# Patient Record
Sex: Male | Born: 1940 | Race: Asian | Hispanic: No | Marital: Married | Smoking: Former smoker
Health system: Southern US, Community
[De-identification: ages and names within clinical notes are randomized; demographics above are authoritative.]

## PROBLEM LIST (undated history)

## (undated) DIAGNOSIS — Z950 Presence of cardiac pacemaker: Secondary | ICD-10-CM

## (undated) DIAGNOSIS — I509 Heart failure, unspecified: Secondary | ICD-10-CM

## (undated) DIAGNOSIS — T8859XA Other complications of anesthesia, initial encounter: Secondary | ICD-10-CM

## (undated) DIAGNOSIS — I10 Essential (primary) hypertension: Secondary | ICD-10-CM

## (undated) DIAGNOSIS — H269 Unspecified cataract: Secondary | ICD-10-CM

## (undated) DIAGNOSIS — N529 Male erectile dysfunction, unspecified: Secondary | ICD-10-CM

## (undated) DIAGNOSIS — I442 Atrioventricular block, complete: Secondary | ICD-10-CM

## (undated) DIAGNOSIS — Z860100 Personal history of colon polyps, unspecified: Secondary | ICD-10-CM

## (undated) HISTORY — PX: VASECTOMY: SHX75

## (undated) HISTORY — DX: Personal history of colon polyps, unspecified: Z86.0100

## (undated) HISTORY — DX: Atrioventricular block, complete: I44.2

## (undated) HISTORY — DX: Essential (primary) hypertension: I10

## (undated) HISTORY — PX: INSERT / REPLACE / REMOVE PACEMAKER: SUR710

## (undated) HISTORY — DX: Personal history of colonic polyps: Z86.010

## (undated) HISTORY — DX: Male erectile dysfunction, unspecified: N52.9

## (undated) HISTORY — DX: Unspecified cataract: H26.9

## (undated) HISTORY — PX: KNEE SURGERY: SHX244

---

## 1996-12-22 HISTORY — PX: PACEMAKER PLACEMENT: SHX43

## 2004-05-01 ENCOUNTER — Other Ambulatory Visit: Payer: Self-pay

## 2004-05-02 ENCOUNTER — Ambulatory Visit: Payer: Self-pay | Admitting: Internal Medicine

## 2008-01-06 ENCOUNTER — Encounter: Admission: RE | Admit: 2008-01-06 | Discharge: 2008-02-12 | Payer: Self-pay | Admitting: Sports Medicine

## 2008-02-17 ENCOUNTER — Ambulatory Visit: Payer: Self-pay | Admitting: Cardiology

## 2008-02-25 ENCOUNTER — Ambulatory Visit: Payer: Self-pay | Admitting: Family Medicine

## 2008-02-25 DIAGNOSIS — I10 Essential (primary) hypertension: Secondary | ICD-10-CM | POA: Insufficient documentation

## 2008-02-25 DIAGNOSIS — Z950 Presence of cardiac pacemaker: Secondary | ICD-10-CM

## 2008-03-07 ENCOUNTER — Ambulatory Visit: Payer: Self-pay | Admitting: Internal Medicine

## 2008-04-26 ENCOUNTER — Ambulatory Visit: Payer: Self-pay | Admitting: Family Medicine

## 2008-07-20 DIAGNOSIS — I442 Atrioventricular block, complete: Secondary | ICD-10-CM | POA: Insufficient documentation

## 2008-08-29 ENCOUNTER — Encounter: Payer: Self-pay | Admitting: Internal Medicine

## 2008-09-07 ENCOUNTER — Encounter: Payer: Self-pay | Admitting: Internal Medicine

## 2008-09-07 ENCOUNTER — Ambulatory Visit: Payer: Self-pay | Admitting: Internal Medicine

## 2009-03-01 ENCOUNTER — Encounter (INDEPENDENT_AMBULATORY_CARE_PROVIDER_SITE_OTHER): Payer: Self-pay | Admitting: *Deleted

## 2009-04-06 ENCOUNTER — Ambulatory Visit: Payer: Self-pay | Admitting: Internal Medicine

## 2009-04-07 LAB — CONVERTED CEMR LAB
BUN: 15 mg/dL (ref 6–23)
Calcium: 8.7 mg/dL (ref 8.4–10.5)
Creatinine, Ser: 0.9 mg/dL (ref 0.4–1.5)
GFR calc non Af Amer: 88.86 mL/min (ref >60)
Glucose, Bld: 132 mg/dL — ABNORMAL HIGH (ref 70–99)

## 2009-05-19 ENCOUNTER — Ambulatory Visit: Payer: Self-pay | Admitting: Family Medicine

## 2009-05-19 DIAGNOSIS — N529 Male erectile dysfunction, unspecified: Secondary | ICD-10-CM | POA: Insufficient documentation

## 2009-05-22 LAB — CONVERTED CEMR LAB
ALT: 13 units/L (ref 0–53)
AST: 18 units/L (ref 0–37)
Calcium: 9.5 mg/dL (ref 8.4–10.5)
Chloride: 101 meq/L (ref 96–112)
Creatinine, Ser: 0.89 mg/dL (ref 0.40–1.50)
HCT: 49.3 % (ref 39.0–52.0)
HDL goal, serum: 40 mg/dL
PSA: 0.53 ng/mL (ref 0.10–4.00)
Platelets: 185 10*3/uL (ref 150–400)
Potassium: 4 meq/L (ref 3.5–5.3)
RDW: 12.9 % (ref 11.5–15.5)
Sodium: 139 meq/L (ref 135–145)
TSH: 1.08 microintl units/mL (ref 0.350–4.500)
Total CHOL/HDL Ratio: 3
Total Protein: 7.7 g/dL (ref 6.0–8.3)

## 2009-08-07 ENCOUNTER — Telehealth (INDEPENDENT_AMBULATORY_CARE_PROVIDER_SITE_OTHER): Payer: Self-pay | Admitting: *Deleted

## 2009-08-07 ENCOUNTER — Encounter: Payer: Self-pay | Admitting: Family Medicine

## 2009-09-11 ENCOUNTER — Ambulatory Visit: Payer: Self-pay

## 2009-09-11 ENCOUNTER — Encounter: Payer: Self-pay | Admitting: Internal Medicine

## 2010-03-16 ENCOUNTER — Ambulatory Visit: Payer: Self-pay | Admitting: Internal Medicine

## 2010-04-03 ENCOUNTER — Telehealth (INDEPENDENT_AMBULATORY_CARE_PROVIDER_SITE_OTHER): Payer: Self-pay | Admitting: *Deleted

## 2010-05-21 ENCOUNTER — Ambulatory Visit: Payer: Self-pay | Admitting: Family Medicine

## 2010-05-28 ENCOUNTER — Encounter: Payer: Self-pay | Admitting: Family Medicine

## 2010-05-29 LAB — CONVERTED CEMR LAB
ALT: 20 units/L (ref 0–53)
CO2: 23 meq/L (ref 19–32)
Cholesterol: 196 mg/dL (ref 0–200)
Creatinine, Ser: 0.91 mg/dL (ref 0.40–1.50)
Glucose, Bld: 102 mg/dL — ABNORMAL HIGH (ref 70–99)
Total Bilirubin: 0.8 mg/dL (ref 0.3–1.2)
Total CHOL/HDL Ratio: 3.4
Triglycerides: 94 mg/dL (ref <150)
VLDL: 19 mg/dL (ref 0–40)

## 2010-07-10 NOTE — Cardiovascular Report (Signed)
Summary: Office Visit   Office Visit   Imported By: Roderic Ovens 03/22/2010 15:43:58  _____________________________________________________________________  External Attachment:    Type:   Image     Comment:   External Document

## 2010-07-10 NOTE — Progress Notes (Signed)
----   Converted from flag ---- ---- 08/07/2009 1:30 PM, Nani Gasser MD wrote: Call pt:if stopped his BP med because of SE then needs to f/u for OV so we can discuss options. ------------------------------  Spoke with pt- he did and will schedule OV

## 2010-07-10 NOTE — Procedures (Signed)
Summary: device/saf   Current Medications (verified): 1)  Aspirin 81 Mg Tbec (Aspirin) .... 2 or 3 Times A Week 2)  Vitamin C Cr 500 Mg Cr-Caps (Ascorbic Acid) .... Take One Tablet By Mouth Once A Day 3)  Multivitamins  Tabs (Multiple Vitamin) .... Take One Tablet By Mouth One Ad Ay 4)  Cialis 10 Mg Tabs (Tadalafil) .... Take 1 Tablet By Mouth Once A Day As Needed  Allergies (verified): No Known Drug Allergies   PPM Specifications Following MD:  Hillis Range, MD     PPM Vendor:  St Jude     PPM Model Number:  [id number]     PPM Serial Number:  [id number] DOI:  05/02/2004     PPM Implanting MD:  NOT IMPLANTED HERE  Lead 1    Location: RA     DOI: 12/22/1996     Model #: [id number]     Serial #: [id number]     Status: active Lead 2    Location: RV     DOI: 12/22/1996     Model #: 1346T     Serial #: [id number]     Status: active  Magnet Response Rate:  BOL 98.6 ERI  86.3  Indications:  CHB   PPM Follow Up Remote Check?  No Battery Voltage:  2.74 V     Battery Est. Longevity:  4 YEARS     Pacer Dependent:  Yes       PPM Device Measurements Atrium  Amplitude: 4.0 mV, Impedance: 408 ohms, Threshold: 0.5 V at 0.4 msec Right Ventricle  Amplitude: PACED AT 30 mV, Impedance: 539 ohms, Threshold: 1.25 V at 0.6 msec  Episodes MS Episodes:  2     Percent Mode Switch:  <1%     Coumadin:  No Ventricular High Rate:  NOT AVAILABLE      Atrial Pacing:  17%     Ventricular Pacing:  >99%  Parameters Mode:  DDD     Lower Rate Limit:  60     Upper Rate Limit:  125 Paced AV Delay:  200     Sensed AV Delay:  200 Next Cardiology Appt Due:  03/10/2010 Tech Comments:  Normal device function.  No changes made today.  Both mode switch episodes less than 1 minute.  No EGMs because of battery drain.  Pt stopped HCTZ on his own because he states it was not helping his HTN and was causing side effects.  PCP is following BP. ROV 6 months Dr Johney Frame. Gypsy Balsam RN BSN  September 11, 2009 2:02 PM

## 2010-07-10 NOTE — Cardiovascular Report (Signed)
Summary: Office Visit   Office Visit   Imported By: Roderic Ovens 09/28/2009 13:58:38  _____________________________________________________________________  External Attachment:    Type:   Image     Comment:   External Document

## 2010-07-10 NOTE — Progress Notes (Signed)
----   Converted from flag ---- ---- 04/02/2010 1:05 PM, Nani Gasser MD wrote: Call pt: I don't have a screening colonsocy on file for him. We can scheduel if he is oK with that or if not then can do stool cards to screen for colon Ca. Can pick up any time. ------------------------------  04/03/10 acm. 8:33 called and spoke with pt's wife and told her above reccomendations.Pt's wife will have him call us back to let us know what he wants to do

## 2010-07-10 NOTE — Letter (Signed)
Summary: [facility]   Imported By: Lanelle Bal 08/09/2009 10:14:45  _____________________________________________________________________  External Attachment:    Type:   Image     Comment:   External Document

## 2010-07-10 NOTE — Assessment & Plan Note (Signed)
Summary: pc2   Primary Provider:  Nani Gasser MD   History of Present Illness: The patient presents today for routine electrophysiology followup. He reports doing very well since last being seen in our clinic. The patient denies symptoms of palpitations, chest pain, shortness of breath, orthopnea, PND, lower extremity edema, dizziness, presyncope, syncope, or neurologic sequela. The patient is tolerating medications without difficulties and is otherwise without complaint today.   Current Medications (verified): 1)  Vitamin C Cr 500 Mg Cr-Caps (Ascorbic Acid) .... Take One Tablet By Mouth Once A Day 2)  Multivitamins  Tabs (Multiple Vitamin) .... Take One Tablet By Mouth One Ad Ay  Allergies (verified): No Known Drug Allergies  Past History:  Past Medical History:  1. Complete heart block    2. Status post dual-chamber pacemaker implanted  (current device is SJM)  3. Spontaneous pneumothorax years ago.   4. HTN (per pt well controlled at home)  Past Surgical History: Reviewed history from 07/20/2008 and no changes required. Knee surgery, right  1955 pacermaker  7.1998, pacer replaced 04/2004 (SJM) Colonsocopy with polyps 2000, 2003 Vasectomy   Vital Signs:  Patient profile:   70 year old male Height:      71 inches Weight:      147 pounds BMI:     20.58 Pulse rate:   72 / minute BP sitting:   150 / 70  (left arm)  Vitals Entered By: Laurance Flatten CMA (March 16, 2010 12:25 PM)  Physical Exam  General:  Well developed, well nourished, in no acute distress. Head:  normocephalic and atraumatic Eyes:  PERRLA/EOM intact; conjunctiva and lids normal. Mouth:  Teeth, gums and palate normal. Oral mucosa normal. Neck:  Neck supple, no JVD. No masses, thyromegaly or abnormal cervical nodes. Chest Wall:  pacemaker site well healed Lungs:  Clear bilaterally to auscultation and percussion. Heart:  RRR, no m/r/g Abdomen:  Bowel sounds positive; abdomen soft and non-tender  without masses, organomegaly, or hernias noted. No hepatosplenomegaly. Msk:  Back normal, normal gait. Muscle strength and tone normal. Pulses:  pulses normal in all 4 extremities Extremities:  No clubbing or cyanosis. Neurologic:  Alert and oriented x 3.   PPM Specifications Following MD:  Hillis Range, MD     PPM Vendor:  St Jude     PPM Model Number:  [phone]     PPM Serial Number:  [id number] DOI:  05/02/2004     PPM Implanting MD:  NOT IMPLANTED HERE  Lead 1    Location: RA     DOI: 12/22/1996     Model #: 1342T     Serial #: [id number]     Status: active Lead 2    Location: RV     DOI: 12/22/1996     Model #: 1346T     Serial #: [id number]     Status: active  Magnet Response Rate:  BOL 98.6 ERI  86.3  Indications:  CHB   PPM Follow Up Remote Check?  No Battery Voltage:  2.74 V     Battery Est. Longevity:  2.25 years     Pacer Dependent:  Yes       PPM Device Measurements Atrium  Amplitude: 3.1 mV, Impedance: 469 ohms, Threshold: 0.5 V at 0.6 msec Right Ventricle  Impedance: 539 ohms, Threshold: 1.0 V at 0.6 msec  Episodes MS Episodes:  1     Percent Mode Switch:  <1%     Coumadin:  No Atrial Pacing:  28%     Ventricular Pacing:  100%  Parameters Mode:  DDD     Lower Rate Limit:  60     Upper Rate Limit:  125 Paced AV Delay:  200     Sensed AV Delay:  200 Next Cardiology Appt Due:  09/09/2010 Tech Comments:  No parameter changes.  Device function normal.  ROV 6 months clinic. Altha Harm, LPN  March 16, 2010 12:32 PM  MD Comments:  agree  Impression & Recommendations:  Problem # 1:  AV BLOCK, COMPLETE (ICD-426.0) normal pacemaker function no changes  Problem # 2:  ELEVATED BLOOD PRESSURE WITHOUT DIAGNOSIS OF HYPERTENSION (ICD-796.2) though BP is elevated today, he brings BP readings from home which are 120-130/70s.  He declines antihypertensive medicine.  HE takes ASA only intermittently.  Salt restrction advised  Patient Instructions: 1)  return in 6 months

## 2010-07-12 NOTE — Assessment & Plan Note (Signed)
Summary: CPE   Vital Signs:  Patient profile:   69 year old male Height:      71 inches Weight:      147 pounds BMI:     20.58 Pulse rate:   75 / minute BP sitting:   155 / 83  (right arm) Cuff size:   regular  Vitals Entered By: Avon Gully CMA, Duncan Dull) (May 21, 2010 1:06 PM)  Primary Care Donald Patel:  Donald Gasser MD   History of Present Illness: HOme BPs look great. Saw Dr. Tillie Rung (Cards) about a month ago. He is doing well.  No specific complaints today. He is not fasting.   Current Medications (verified): 1)  Vitamin C Cr 500 Mg Cr-Caps (Ascorbic Acid) .... Take One Tablet By Mouth Once A Day 2)  Multivitamins  Tabs (Multiple Vitamin) .... Take One Tablet By Mouth One Ad Ay  Allergies (verified): No Known Drug Allergies  Comments:  Nurse/Medical Assistant: The patient's medications and allergies were reviewed with the patient and were updated in the Medication and Allergy Lists. Avon Gully CMA, Duncan Dull) (May 21, 2010 1:07 PM)  Past History:  Past Medical History: Last updated: 03/16/2010  1. Complete heart block    2. Status post dual-chamber pacemaker implanted  (current device is SJM)  3. Spontaneous pneumothorax years ago.   4. 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  HS education. Married to [employer] with 2 adult kids.  Former Smoker Alcohol use-yes Drug use-no Regular exercise-no  Review of Systems  The patient denies anorexia, fever, weight loss, weight gain, vision loss, decreased hearing, hoarseness, chest pain, syncope, dyspnea on exertion, peripheral  edema, prolonged cough, headaches, hemoptysis, abdominal pain, melena, hematochezia, severe indigestion/heartburn, hematuria, incontinence, genital sores, muscle weakness, suspicious skin lesions, transient blindness, difficulty walking, depression, unusual weight change, abnormal bleeding, enlarged lymph nodes, and breast masses.    Physical Exam  General:  Well-developed,well-nourished,in no acute distress; alert,appropriate and cooperative throughout examination Head:  Normocephalic and atraumatic without obvious abnormalities. No apparent alopecia or balding. Eyes:  No corneal or conjunctival inflammation noted. EOMI. Perrla. Ears:  External ear exam shows no significant lesions or deformities.  Otoscopic examination reveals clear canals, tympanic membranes are intact bilaterally without bulging, retraction, inflammation or discharge. Hearing is grossly normal bilaterally. Nose:  External nasal examination shows no deformity or inflammation.  Mouth:  Oral mucosa and oropharynx without lesions or exudates.  Neck:  No deformities, masses, or tenderness noted. Chest Wall:  No deformities, masses, tenderness or gynecomastia noted. Lungs:  Normal respiratory effort, chest expands symmetrically. Lungs are clear to auscultation, no crackles or wheezes. Heart:  Normal rate and regular rhythm. S1 and S2 normal without gallop, murmur, click, rub or other extra sounds.No carotid or abdominal bruits.  Abdomen:  Bowel sounds positive,abdomen soft and non-tender without masses, organomegaly or hernias noted. Rectal:  normal sphincter tone, no masses, and external hemorrhoid(s).   Prostate:  no nodules, no asymmetry, and 2+ enlarged.   Msk:  No deformity or scoliosis noted of thoracic or lumbar spine.   Pulses:  R and L carotid,radial,dorsalis pedis and posterior tibial pulses are full and equal  bilaterally Extremities:  No clubbing, cyanosis, edema, or deformity noted with normal full range of motion of all  joints.   Neurologic:  No cranial nerve deficits noted. Station and gait are normal.DTRs are symmetrical throughout. Sensory, motor and coordinative functions appear intact. Skin:  no rashes.   Cervical Nodes:  No lymphadenopathy noted Psych:  Cognition and judgment appear intact. Alert and cooperative with normal attention span and concentration. No apparent delusions, illusions, hallucinations   Impression & Recommendations:  Problem # 1:  HEALTH MAINTENANCE EXAM (ICD-V70.0) Declined flu vaccine. Tdap and PNA are uptodate Needs labs. LDL was mildly elevated last year.  Encourage regular exercise. He still works. Dsicussed getting the zostavax and gave him a rx today.  Orders: T-Comprehensive Metabolic Panel [id number]) T-PSA Total (Medicare Screen Only) [id number]) T-Lipid Profile [phone])  Complete Medication List: 1)  Vitamin C Cr 500 Mg Cr-caps (Ascorbic acid) .... Take one tablet by mouth once a day 2)  Multivitamins Tabs (Multiple vitamin) .... Take one tablet by mouth one ad ay 3)  Aspirin 81 Mg Tabs (Aspirin) .... Take 1 tablet by mouth once a day 4)  Zostavax 22025 Unt/0.26ml Solr (Zoster vaccine live) .... Inject im x one.  Contraindications/Deferment of Procedures/Staging:    Test/Procedure: FLU VAX    Reason for deferment: patient declined   Patient Instructions: 1)  Our lab is open 8AM monday through Friday.  2)  We will call you back as soon as we get your lab results.  Prescriptions: ZOSTAVAX 42706 UNT/0.65ML SOLR (ZOSTER VACCINE LIVE) inject IM x one.  #1 x 0   Entered and Authorized by:   Donald Gasser MD   Signed by:   Donald Gasser MD on 05/21/2010   Method used:   Print then Give to Patient   RxID:   [id number]    Orders Added: 1)  T-Comprehensive Metabolic Panel [[id number]] 2)  T-PSA Total (Medicare Screen Only) [[id number]] 3)  T-Lipid Profile [[id number]] 4)  Est. Patient age 66&> [93818]     Last Flu Vaccine:   Historical (08/12/1997 1:00:37 PM) Flu Vaccine Next Due:  Refused

## 2010-09-10 ENCOUNTER — Ambulatory Visit (INDEPENDENT_AMBULATORY_CARE_PROVIDER_SITE_OTHER): Payer: Medicare Other | Admitting: *Deleted

## 2010-09-10 DIAGNOSIS — I442 Atrioventricular block, complete: Secondary | ICD-10-CM

## 2010-10-23 NOTE — Assessment & Plan Note (Signed)
[facility]                            CARDIOLOGY OFFICE NOTE   NAME:Donald Patel, Donald Patel                       MRN:          [id number]  DATE:02/17/2008                            DOB:          1940-10-24    [name] is a 70 year old male who has previously been followed in  [facility] who presents for evaluation of occasional chest pain and for  prior pacemaker placement.  He has previously been followed by Dr.  Dario Guardian in [place] as well as Dr. Cleatis Polka.  He apparently had a  pacemaker placed in July 1998.  It is a Biomedical scientist.  It was  replaced in November 2005.  His last battery check was on January 28, 2008.  The device apparently was placed because he was having episodes  of syncope at work and Holter monitor revealed bradycardia.  I do not  have those records available.  Also of note, the patient had a Myoview  performed on November 09, 2007, by Dr. Dario Guardian.  Apparently, he was normal,  but those records are not available.  The patient does state that he  occasionally feels a pain in his chest.  However, it is typically when  he is working with his arms.  It only lasts seconds and it does not  radiate.  There is no associated symptoms.  Note, he otherwise does not  have exertional chest pain.  He does not have dyspnea on exertion,  orthopnea, PND, pedal edema, palpitations, pre-syncope, or syncope.  He  recently moved to [facility] and wanted to be followed here in  [place].   His medications include aspirin 81 mg p.o. daily, vitamin C 500 mg p.o.  daily, multivitamin daily.   He has no known drug allergies.   SOCIAL HISTORY:  He has remote history of tobacco use, but has not  smoked since late 1980s.  He consumes 2 beers per day.   FAMILY HISTORY:  Positive for coronary artery disease and his brother  died of a myocardial infarction in his 71s.   PAST MEDICAL HISTORY:  He denies any diabetes, hypertension, or  hyperlipidemia.  He has  had a previous pneumothorax requiring chest tube  placement.  He also has a previous pacemaker as described above.  He has  had previous polyps removed as well.  He had knee surgery as a child.   REVIEW OF SYSTEMS:  He denies any headaches, fevers, or chills.  There  is no productive cough or hemoptysis.  There is no dysphagia or  odynophagia, melena or hematochezia.  There is no dysuria or hematuria.  There is no rashes or seizure activity.  There is no orthopnea, PND, or  pedal edema.  There is no claudication noted.  Remaining systems are  negative.   PHYSICAL EXAMINATION:  VITAL SIGNS:  Today, shows a blood pressure of  148/80.  His pulse is 60.  He weighs 160 pounds.  GENERAL:  He is well developed and well nourished in no acute distress.  SKIN:  Warm and dry.  Not depressed.  No peripheral clubbing.  BACK:  Normal.  HEENT:  Normal.  Normal eyelids.  NECK:  Supple with normal upstroke bilaterally and no bruits noted.  There is no jugular venous distention.  I cannot appreciate thyromegaly.  CHEST:  Clear to auscultation without rhonchi or rales.  CARDIOVASCULAR:  Regular rate and rhythm.  Normal S1 and S2.  There is  no murmurs noted, but his heart sounds are distant.  ABDOMEN:  Nontender with positive bowel sounds.  No hepatosplenomegaly.  No mass appreciated.  There is no abdominal bruit.  He has 2+ femoral  pulses bilaterally.  No bruits.  EXTREMITIES:  No edema palpate no cords.  He has 2+ dorsalis pedis  pulses bilaterally.  NEUROLOGIC:  Grossly intact.   His electrocardiogram shows AV sequential pacing.   DIAGNOSES:  1. Status post pacemaker placement - his device apparently was checked      recently and was functioning appropriately.  We will arrange for      him to be followed in one of the Pacemaker Clinic with one of our      electrophysiologist.  2. History of atypical chest pain - he had a recent Myoview requested      by Dr. Dario Guardian.  We will have those records  forwarded to [place].  3. Borderline hypertension - his blood pressure is mildly elevated.      He states at home and is within normal range.  This will be      followed and additional medications can be added as indicated.   He will see [place] back on an as-needed basis if his previous Myoview is  normal.  In the meantime, we will arrange for him to be seen by one of  the electrophysiologist as described above.  We will also arrange for  him to have a primary care physician here in [place] (either Dr.  Cathey Endow or Dr. Linford Arnold).     Madolyn Frieze Jens Som, MD, [facility]  Electronically Signed    BSC/MedQ  DD: 02/17/2008  DT: 02/18/2008  Job #: [id number]

## 2010-10-23 NOTE — Assessment & Plan Note (Signed)
[facility]                         ELECTROPHYSIOLOGY OFFICE NOTE   NAME:Donald Patel, Donald Patel                     MRN:          [id number]  DATE:03/07/2008                            DOB:          Oct 15, 1940    REFERRING PHYSICIAN:  Madolyn Frieze. Jens Som, MD, [facility]   CHIEF COMPLAINT:  I am here to establish care.   HISTORY OF PRESENT ILLNESS:  Donald Patel is a very pleasant 70 year old  gentleman with a history of complete heart block, status post pacemaker  implantation who presents today to establish care in the Pacemaker  Clinic.  The patient reports having syncope in July 1998 for which he  presented and was found to have complete heart block.  He underwent dual-  chamber pacemaker implantation by Dr. Dario Guardian in [place] at that time.  The patient reports doing well without further syncopal episodes.  He  subsequently underwent pulse generator replacement with a St. Jude  Medical Identity ADx XL DR dual-chamber pacemaker implanted May 02, 2004.  The existing right atrial lead is a Designer, jewellery model [phone] (serial# [id number]) lead and the ventricular lead is a Transport planner model [id number] (serial number [id number]) lead.  This leads  replaced December 22, 1996.  The patient reports doing very well since that  time.  He denies any further symptoms of presyncope.  He has also been  without significant chest discomfort, palpitations, shortness of breath,  or signs of heart failure.  He reports having a Myoview recently  obtained by Dr. Dario Guardian, which was negative (per the patient).  He is  otherwise without complaint today.  He [place] living in [place] and wishes to relocate his clinical followup to our  office.   PAST MEDICAL HISTORY:  1. Complete heart block (as above).  2. Status post dual-chamber pacemaker implanted (as above).  3. Spontaneous pneumothorax years ago.  4. Status post knee surgery as a child.  5.  Status post polypectomy.   MEDICATIONS:  1. Aspirin 81 mg daily.  2. Vitamin C 500 mg daily.  3. Multivitamin daily.   ALLERGIES:  No known drug allergies.   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  He has a remote history of tobacco, but quit in  the 1980s.  He drinks occasional alcohol and denies drug use.   FAMILY HISTORY:  This patient had a brother who died of a myocardial  infarction in his 5s.   REVIEW OF SYSTEMS:  All systems are reviewed and negative except as  outlined in the HPI above.   PHYSICAL EXAMINATION:  VITAL SIGNS:  Blood pressure 148/88, heart rate  77, respirations 18, and weight 160 pounds.  GENERAL:  The patient is a well-appearing male in no acute distress.  He  is alert and oriented x3.  HEENT:  Normocephalic and atraumatic.  Sclerae clear.  Conjunctivae is  pink.  Oropharynx is clear.  NECK:  Supple.  No JVD, lymphadenopathy, or bruits.  LUNGS:  Clear to auscultation bilaterally.  HEART:  Regular rate and rhythm.  No murmurs, rubs, or gallops.  GI:  Soft, nontender, and nondistended.  Positive bowel sounds.  EXTREMITIES:  No clubbing or cyanosis.  Trace lower extremity edema.  NEUROLOGIC:  Cranial nerves II through XII are intact.  Strength and  sensation are intact.  SKIN:  No ecchymosis or laceration.  MUSCULOSKELETAL:  No deformity or atrophy.  PSYCH:  Euthymic mood and full affect.  PACEMAKER POCKET:  The pocket is well healed with no evidence of  infection, malposition, or protrusion.   EKG, an EKG obtained February 17, 2008, revealed AV sequential pacing.   DEVICE INTERROGATION:  The patient's dual-chamber pacemaker was recently  interrogated January 28, 2008.  This revealed a St. Jude Medical Identity  ADx XL DR model [phone] (serial [id number]) dual-chamber pacemaker  functioning appropriately in the DDD pacing mode with a lower rate limit  of 60 and a maximum tracking rate of 126 beats per  minute.  The AV  sensed and paced delay are programmed at 200 milliseconds.  The battery  status is good with a voltage of 2.73 volts and an estimated longevity  of 2.25 to 3 years.  The atrial lead threshold measured 0.5 volts at 0.6  milliseconds with an impedance of 461 ohms and a P wave of 4 to 4.47  millivolts.  The right ventricular lead impedance measured 551 ohms with  a threshold of 1.25 volts at 0.6 milliseconds.  No underlying R wave was  detected when pacing at 30 beats per minute.  The patient is greater  than 99% V paced and 15% A paced.  The device was therefore not  reprogrammed or interrogated today.   IMPRESSION:  Donald Patel is a very pleasant 70 year old gentleman with a  dual-chamber pacemaker previously implanted for complete heart block.  He is currently doing well and without evidence of pacemaker  malfunction.  The recent interrogation January 28, 2008, revealed  adequate programming and function of the device with an estimated  battery longevity of 2.25 to 3 years.   PLAN:  No programming changes were made today.  No medication changes  were made today.  The patient was provided with a transtelephonic  monitor today and instructed to comply with every 3 months TTMs.  He  will return to our clinic in 6 months for routine followup.     Hillis Range, MD  Electronically Signed    JA/MedQ  DD: 03/07/2008  DT: 03/08/2008  Job #: [id number]   cc:   Madolyn Frieze. Jens Som, MD, [facility]  Duke Salvia, MD, [facility]

## 2011-02-22 ENCOUNTER — Encounter: Payer: Self-pay | Admitting: *Deleted

## 2011-02-22 ENCOUNTER — Encounter: Payer: Self-pay | Admitting: Internal Medicine

## 2011-02-25 ENCOUNTER — Ambulatory Visit (INDEPENDENT_AMBULATORY_CARE_PROVIDER_SITE_OTHER): Payer: Medicare Other | Admitting: Internal Medicine

## 2011-02-25 ENCOUNTER — Encounter: Payer: Self-pay | Admitting: Internal Medicine

## 2011-02-25 DIAGNOSIS — I442 Atrioventricular block, complete: Secondary | ICD-10-CM

## 2011-02-25 DIAGNOSIS — I10 Essential (primary) hypertension: Secondary | ICD-10-CM

## 2011-02-25 DIAGNOSIS — Z950 Presence of cardiac pacemaker: Secondary | ICD-10-CM

## 2011-02-25 LAB — PACEMAKER DEVICE OBSERVATION
AL AMPLITUDE: 4.9 mv
AL IMPEDENCE PM: 475 Ohm
ATRIAL PACING PM: 30
BATTERY VOLTAGE: 2.73 V
VENTRICULAR PACING PM: 99

## 2011-02-25 NOTE — Assessment & Plan Note (Signed)
Remains elevated Unfortunately, he is very clear that he will not take antihypertensive medicines at this time. No changes today

## 2011-02-25 NOTE — Progress Notes (Signed)
The patient presents today for routine electrophysiology followup.  Since last being seen in our clinic, the patient reports doing very well.  He remains very active.  Today, he denies symptoms of palpitations, chest pain, shortness of breath, orthopnea, PND, lower extremity edema, dizziness, presyncope, syncope, or neurologic sequela.  The patient feels that he is tolerating medications without difficulties and is otherwise without complaint today.   Past Medical History  Diagnosis Date  . AV BLOCK, COMPLETE     s/p PPM (SJM)  . ERECTILE DYSFUNCTION, ORGANIC   . Hypertension    Past Surgical History  Procedure Date  . Knee surgery   . Vasectomy   . 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 Ascorbic Acid (VITAMIN C) 500 MG tablet Take 500 mg by mouth daily.        Marland Kitchen aspirin 81 MG tablet Take 81 mg by mouth daily.       . Multiple Vitamin (MULTIVITAMIN) tablet Take 1 tablet by mouth daily.          No Known Allergies  History   Social History  . Marital Status: Married    Spouse Name: N/A    Number of Children: N/A  . Years of Education: N/A   Occupational History  . Not on file.   Social History Main Topics  . Smoking status: Former Smoker    Quit date: 06/10/1985  . Smokeless tobacco: Not on file  . Alcohol Use: No  . Drug Use: No  . Sexually Active: Not on file   Other Topics Concern  . Not on file   Social History Narrative   Recently retired,Former Chartered certified accountant    Family History  Problem Relation Age of Onset  . Heart attack    . Diabetes      ROS-  All systems are reviewed and are negative except as outlined in the HPI above   Physical Exam: Filed Vitals:   02/25/11 0933  BP: 156/82  Pulse: 70  Height: 5\' 10"  (1.778 m)  Weight: 151 lb 6.4 oz (68.675 kg)    GEN- The patient is well  appearing, alert and oriented x 3 today.   Head- normocephalic, atraumatic Eyes-  Sclera clear, conjunctiva pink Ears- hearing intact Oropharynx- clear Neck- supple, no JVP Lymph- no cervical lymphadenopathy Lungs- Clear to ausculation bilaterally, normal work of breathing Chest- pacemaker pocket is well healed Heart- Regular rate and rhythm, no murmurs, rubs or gallops, PMI not laterally displaced GI- soft, NT, ND, + BS Extremities- no clubbing, cyanosis, or edema MS- no significant deformity or atrophy Skin- no rash or lesion Psych- euthymic mood, full affect Neuro- strength and sensation are intact  Pacemaker interrogation- reviewed in detail today,  See PACEART report  Assessment and Plan:

## 2011-02-25 NOTE — Assessment & Plan Note (Signed)
Normal pacemaker function See Pace Art report No changes today  

## 2011-02-25 NOTE — Patient Instructions (Signed)
Your physician wants you to follow-up in: 6 months with Kristin/Paula for a device check & 1 year with Dr. Johney Frame. You will receive a reminder letter in the mail two months in advance. If you don't receive a letter, please call our office to schedule the follow-up appointment.  Your physician recommends that you continue on your current medications as directed. Please refer to the Current Medication list given to you today.

## 2011-05-08 ENCOUNTER — Telehealth: Payer: Self-pay | Admitting: Internal Medicine

## 2011-05-08 NOTE — Telephone Encounter (Signed)
Pt calling re BP running high, question re adjusting meds

## 2011-05-08 NOTE — Telephone Encounter (Signed)
Patient states that Dr Johney Frame had given him an antihypertensive in the past and he did not take it because he is stubborn.  It was HCTZ 25 mg  He is saying he needs something because his blood pressure is out of control  I let him know I would discuss with Dr Johney Frame and call him something

## 2011-05-09 MED ORDER — HYDROCHLOROTHIAZIDE 25 MG PO TABS: 25.0000 mg | ORAL_TABLET | Freq: Every day | ORAL | Status: DC

## 2011-05-09 NOTE — Telephone Encounter (Signed)
Spoke with patient and let him know Dr Johney Frame agreed to call in his HCTZ 25mg    He is due to have his device checked in a couple of weeks and will give [place] an update at that time

## 2011-06-11 HISTORY — PX: CATARACT EXTRACTION W/ INTRAOCULAR LENS IMPLANT: SHX1309

## 2011-09-09 ENCOUNTER — Other Ambulatory Visit: Payer: Self-pay | Admitting: *Deleted

## 2011-09-09 ENCOUNTER — Encounter: Payer: Self-pay | Admitting: Internal Medicine

## 2011-09-09 ENCOUNTER — Ambulatory Visit (INDEPENDENT_AMBULATORY_CARE_PROVIDER_SITE_OTHER): Payer: Medicare Other | Admitting: *Deleted

## 2011-09-09 DIAGNOSIS — I442 Atrioventricular block, complete: Secondary | ICD-10-CM

## 2011-09-09 MED ORDER — HYDROCHLOROTHIAZIDE 25 MG PO TABS: 25.0000 mg | ORAL_TABLET | Freq: Every day | ORAL | Status: DC

## 2011-09-09 NOTE — Progress Notes (Signed)
PPM check 

## 2011-09-10 ENCOUNTER — Other Ambulatory Visit: Payer: Self-pay | Admitting: *Deleted

## 2011-09-10 LAB — PACEMAKER DEVICE OBSERVATION
AL IMPEDENCE PM: 479 Ohm
ATRIAL PACING PM: 17
BAMS-0001: 180 {beats}/min
BAMS-0003: 60 {beats}/min
DEVICE MODEL PM: 1394111
RV LEAD THRESHOLD: 0.75 V

## 2011-09-17 ENCOUNTER — Other Ambulatory Visit: Payer: Self-pay

## 2011-09-17 MED ORDER — HYDROCHLOROTHIAZIDE 25 MG PO TABS: 25.0000 mg | ORAL_TABLET | Freq: Every day | ORAL | Status: DC

## 2011-09-17 NOTE — Telephone Encounter (Signed)
.   Requested Prescriptions   Signed Prescriptions Disp Refills  . hydrochlorothiazide (HYDRODIURIL) 25 MG tablet 30 tablet 5    Sig: Take 1 tablet (25 mg total) by mouth daily.    Authorizing Provider: Hillis Range    Ordering User: Lacie Scotts

## 2012-02-11 ENCOUNTER — Encounter: Payer: Self-pay | Admitting: *Deleted

## 2012-02-19 ENCOUNTER — Encounter: Payer: Self-pay | Admitting: Internal Medicine

## 2012-02-19 ENCOUNTER — Ambulatory Visit (INDEPENDENT_AMBULATORY_CARE_PROVIDER_SITE_OTHER): Payer: Medicare Other | Admitting: Internal Medicine

## 2012-02-19 VITALS — BP 130/72 | HR 78 | Resp 18 | Ht 71.0 in | Wt 150.0 lb

## 2012-02-19 DIAGNOSIS — I442 Atrioventricular block, complete: Secondary | ICD-10-CM

## 2012-02-19 DIAGNOSIS — I10 Essential (primary) hypertension: Secondary | ICD-10-CM

## 2012-02-19 DIAGNOSIS — Z950 Presence of cardiac pacemaker: Secondary | ICD-10-CM

## 2012-02-19 LAB — PACEMAKER DEVICE OBSERVATION
AL AMPLITUDE: 4.3 mv
AL THRESHOLD: 0.5 V
BAMS-0003: 60 {beats}/min
DEVICE MODEL PM: 1394111
RV LEAD THRESHOLD: 0.625 V
VENTRICULAR PACING PM: 99

## 2012-02-19 NOTE — Patient Instructions (Addendum)
Your physician wants you to follow-up in: 1 year with Dr. Johney Frame. You will receive a reminder letter in the mail two months in advance. If you don't receive a letter, please call our office to schedule the follow-up appointment.  Your physician recommends that you have lab work today BMP we will call you with your results.  Your physician recommends that you continue on your current medications as directed. Please refer to the Current Medication list given to you today.

## 2012-02-19 NOTE — Progress Notes (Signed)
PCP: Nani Gasser, MD  Donald Patel is a 71 y.o. male who presents today for routine electrophysiology followup.  Since last being seen in our clinic, the patient reports doing very well.  Today, he denies symptoms of palpitations, chest pain, shortness of breath,  lower extremity edema, dizziness, presyncope, or syncope.  The patient is otherwise without complaint today.   Past Medical History  Diagnosis Date  . AV BLOCK, COMPLETE     s/p PPM (SJM)  . ERECTILE DYSFUNCTION, ORGANIC   . Hypertension    Past Surgical History  Procedure Date  . Knee surgery   . Vasectomy   . 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 hydrochlorothiazide (HYDRODIURIL) 25 MG tablet Take 1 tablet (25 mg total) by mouth daily.  30 tablet  5    Physical Exam: Filed Vitals:   02/19/12 1002  BP: 130/72  Pulse: 78  Resp: 18  Height: 5\' 11"  (1.803 m)  Weight: 150 lb (68.04 kg)  SpO2: 97%    GEN- The patient is well appearing, alert and oriented x 3 today.   Head- normocephalic, atraumatic Eyes-  Sclera clear, conjunctiva pink Ears- hearing intact Oropharynx- clear Lungs- Clear to ausculation bilaterally, normal work of breathing Chest- pacemaker pocket is well healed Heart- Regular rate and rhythm, no murmurs, rubs or gallops, PMI not laterally displaced GI- soft, NT, ND, + BS Extremities- no clubbing, cyanosis, or edema  Pacemaker interrogation- reviewed in detail today,  See PACEART report  Assessment and Plan:  AV BLOCK, COMPLETE - Hillis Range, MD   Normal pacemaker function  See Pace Art report  No changes today  carelink every 3 months Return in 1year  Essential hypertension, benign - Hillis Range, MD   Improved with hctz Check BMET today  Pt due for follow-up with PCP.  Wishes to have fasting lipids with  PCP.

## 2012-02-20 LAB — BASIC METABOLIC PANEL
CO2: 26 mEq/L (ref 19–32)
Calcium: 9.6 mg/dL (ref 8.4–10.5)
Creatinine, Ser: 1 mg/dL (ref 0.4–1.5)
GFR: 81.81 mL/min (ref >60.00)

## 2012-02-25 ENCOUNTER — Telehealth: Payer: Self-pay | Admitting: *Deleted

## 2012-02-25 ENCOUNTER — Encounter: Payer: Self-pay | Admitting: Family Medicine

## 2012-02-25 ENCOUNTER — Ambulatory Visit (INDEPENDENT_AMBULATORY_CARE_PROVIDER_SITE_OTHER): Payer: Medicare Other | Admitting: Family Medicine

## 2012-02-25 VITALS — BP 151/80 | HR 60 | Wt 147.0 lb

## 2012-02-25 DIAGNOSIS — I10 Essential (primary) hypertension: Secondary | ICD-10-CM

## 2012-02-25 DIAGNOSIS — Z1211 Encounter for screening for malignant neoplasm of colon: Secondary | ICD-10-CM

## 2012-02-25 DIAGNOSIS — Z0000 Encounter for general adult medical examination without abnormal findings: Secondary | ICD-10-CM

## 2012-02-25 DIAGNOSIS — I442 Atrioventricular block, complete: Secondary | ICD-10-CM

## 2012-02-25 DIAGNOSIS — Z125 Encounter for screening for malignant neoplasm of prostate: Secondary | ICD-10-CM

## 2012-02-25 LAB — COMPLETE METABOLIC PANEL WITH GFR
ALT: 15 U/L (ref 0–53)
AST: 19 U/L (ref 0–37)
Albumin: 4.7 g/dL (ref 3.5–5.2)
Alkaline Phosphatase: 73 U/L (ref 39–117)
BUN: 15 mg/dL (ref 6–23)
Creat: 0.94 mg/dL (ref 0.50–1.35)
Potassium: 3.3 mEq/L — ABNORMAL LOW (ref 3.5–5.3)

## 2012-02-25 LAB — LIPID PANEL
Cholesterol: 197 mg/dL (ref 0–200)
Total CHOL/HDL Ratio: 3.5 Ratio
Triglycerides: 84 mg/dL (ref <150)
VLDL: 17 mg/dL (ref 0–40)

## 2012-02-25 NOTE — Telephone Encounter (Signed)
Added To Immun

## 2012-02-25 NOTE — Telephone Encounter (Signed)
FYI:He had his shingles vacc at [facility] on 05-25-2010

## 2012-02-25 NOTE — Patient Instructions (Signed)
Start a regular exercise program and make sure you are eating a healthy diet Try to eat 4 servings of dairy a day  Your vaccines are up to date.  We will call you with your lab results. If you don't here from us in about a week then please give us a call at [phone].    

## 2012-02-25 NOTE — Progress Notes (Signed)
Subjective:    Donald Patel is a 71 y.o. male who presents for Medicare Annual/Subsequent preventive examination.   Preventive Screening-Counseling & Management  Tobacco History  Smoking status  . Former Smoker  . Quit date: 06/10/1985  Smokeless tobacco  . Not on file    Problems Prior to Visit 1.   Current Problems (verified) Patient Active Problem List  Diagnosis  . AV BLOCK, COMPLETE  . ERECTILE DYSFUNCTION, ORGANIC  . Essential hypertension, benign  . PACEMAKER-[place]    Medications Prior to Visit Current Outpatient Prescriptions on File Prior to Visit  Medication Sig Dispense Refill  . hydrochlorothiazide (HYDRODIURIL) 25 MG tablet Take 1 tablet (25 mg total) by mouth daily.  30 tablet  5    Current Medications (verified) Current Outpatient Prescriptions  Medication Sig Dispense Refill  . hydrochlorothiazide (HYDRODIURIL) 25 MG tablet Take 1 tablet (25 mg total) by mouth daily.  30 tablet  5     Allergies (verified) Review of patient's allergies indicates no known allergies.   PAST HISTORY  Family History Family History  Problem Relation Age of Onset  . Heart attack    . Diabetes      Social History History  Substance Use Topics  . Smoking status: Former Smoker    Quit date: 06/10/1985  . Smokeless tobacco: Not on file  . Alcohol Use: No    Are there smokers in your home (other than you)?  No  Risk Factors Current exercise habits: very physicall active  Dietary issues discussed: none   Cardiac risk factors: advanced age (older than 52 for men, 44 for women) and male gender.  Depression Screen (Note: if answer to either of the following is "Yes", a more complete depression screening is indicated)   Q1: Over the past two weeks, have you felt down, depressed or hopeless? No  Q2: Over the past two weeks, have you felt little interest or pleasure in doing things? No  Have you lost interest or pleasure in daily life? No  Do you often  feel hopeless? No  Do you cry easily over simple problems? No  Activities of Daily Living In your present state of health, do you have any difficulty performing the following activities?:  Driving? Yes Managing money?  No Feeding yourself? No Getting from bed to chair? No Climbing a flight of stairs? No Preparing food and eating?: No Bathing or showering? No Getting dressed: No Getting to the toilet? No Using the toilet:No Moving around from place to place: No In the past year have you fallen or had a near fall?:No   Are you sexually active?  No  Do you have more than one partner?  No  Hearing Difficulties: No Do you often ask people to speak up or repeat themselves? No Do you experience ringing or noises in your ears? Yes Do you have difficulty understanding soft or whispered voices? No   Do you feel that you have a problem with memory? No  Do you often misplace items? No  Do you feel safe at home?  No  Cognitive Testing  Alert? Yes  Normal Appearance?Yes  Oriented to person? Yes  Place? Yes   Time? Yes  Recall of three objects?  Yes  Can perform simple calculations? Yes  Displays appropriate judgment?Yes  Can read the correct time from a watch face?Yes   Advanced Directives have been discussed with the patient? No   List the Names of Other Physician/Practitioners you currently use: 1.  Indicate any recent Medical Services you may have received from other than [facility] providers in the past year (date may be approximate).  Immunization History  Administered Date(s) Administered  . Influenza Whole 08/12/1997  . Pneumococcal Polysaccharide 05/19/2009  . Td 02/25/2008    Screening Tests Health Maintenance  Topic Date Due  . Colonoscopy  01/10/1990  . Tetanus/tdap  02/24/2018  . Influenza Vaccine  03/10/2012  . Pneumococcal Polysaccharide Vaccine Age 49 And Over  Completed  . Zostavax  Addressed    All answers were reviewed with the patient and necessary  referrals were made:  Velicia Dejager, MD   02/25/2012   History reviewed: allergies, current medications, past family history, past medical history, past social history, past surgical history and problem list  Review of Systems A comprehensive review of systems was negative.    Objective:     Vision by Snellen chart: right eye:20/70, left eye:20/25 Blood pressure 151/80, pulse 60, weight 147 lb (66.679 kg). There is no height on file to calculate BMI.  BP 151/80  Pulse 60  Wt 147 lb (66.679 kg)  General Appearance:    Alert, cooperative, no distress, appears stated age  Head:    Normocephalic, without obvious abnormality, atraumatic  Eyes:    PERRL, conjunctiva/corneas clear, EOM's intact, both eyes       Ears:    Normal TM's and external ear canals, both ears  Nose:   Nares normal, septum midline, mucosa normal, no drainage    or sinus tenderness  Throat:   Lips, mucosa, and tongue normal; teeth and gums normal  Neck:   Supple, symmetrical, trachea midline, no adenopathy;       thyroid:  No enlargement/tenderness/nodules; no carotid   bruit or JVD  Back:     Symmetric, no curvature, ROM normal, no CVA tenderness  Lungs:     Clear to auscultation bilaterally, respirations unlabored  Chest wall:    No tenderness or deformity  Heart:    Regular rate and rhythm, S1 and S2 normal, no murmur, rub   or gallop  Abdomen:     Soft, non-tender, bowel sounds active all four quadrants,    no masses, no organomegaly  Genitalia:    Not performed.   Rectal:    Normal tone, normal prostate, no masses or tenderness;   guaiac negative stool. Several hemorrhoids around the rectum.   Extremities:   Extremities normal, atraumatic, no cyanosis or edema  Pulses:   2+ and symmetric all extremities  Skin:   Skin color, texture, turgor normal, no rashes or lesions  Lymph nodes:   Cervical, supraclavicular nodes normal  Neurologic:   CNII-XII intact. Normal strength, sensation and reflexes       throughout       Assessment:     Annual Medicare Wellness Exam     Plan:     During the course of the visit the patient was educated and counseled about appropriate screening and preventive services including:    Influenza vaccine  Colorectal cancer screening  Advanced directives: has NO advanced directive  - add't info requested. Referral to SW: not applicable. Says he will check into it.   Abnormal vision. Had scarring after cataract removed in the right eye.   EKG shows rate of 59 beats per minute. He has a dual-chamber pacemaker. No abnormalities.  Diet review for nutrition referral? Yes ____  Not Indicated __x__   Patient Instructions (the written plan) was given to the patient.  Medicare Attestation  I have personally reviewed: The patient's medical and social history Their use of alcohol, tobacco or illicit drugs Their current medications and supplements The patient's functional ability including ADLs,fall risks, home safety risks, cognitive, and hearing and visual impairment Diet and physical activities Evidence for depression or mood disorders  The patient's weight, height, BMI, and visual acuity have been recorded in the chart.  I have made referrals, counseling, and provided education to the patient based on review of the above and I have provided the patient with a written personalized care plan for preventive services.     Kate Larock, MD   02/25/2012

## 2012-03-19 DIAGNOSIS — I442 Atrioventricular block, complete: Secondary | ICD-10-CM

## 2012-04-16 ENCOUNTER — Encounter: Payer: Self-pay | Admitting: Family Medicine

## 2012-06-18 ENCOUNTER — Encounter: Payer: Self-pay | Admitting: Internal Medicine

## 2012-06-18 DIAGNOSIS — I442 Atrioventricular block, complete: Secondary | ICD-10-CM

## 2012-07-30 ENCOUNTER — Telehealth: Payer: Self-pay | Admitting: Internal Medicine

## 2012-07-30 NOTE — Telephone Encounter (Signed)
07-30-12 LMM @ 414PM for pt to set up past due pacer check, not checked since September, schedule with brooke/mt

## 2012-08-03 NOTE — Telephone Encounter (Signed)
Is due to send a transmission in Dec.  Is he set up for [facility]?  Will forward to [place] to make sure patient is set up for this and if not will need to come into office to have his device checked

## 2012-08-03 NOTE — Telephone Encounter (Signed)
Spoke to pt in regards to appointment with Nehemiah Settle on 08-11-12. Pt has a SJM Identity and needs ppm check in office. Pt does do [employer] checks but needs office check since last check was in September. Pt aware and to keep appt on 08-11-12.

## 2012-08-03 NOTE — Telephone Encounter (Signed)
New Prob   Pt has an appt 3/4 but feels this is incorrect. Stated something was set up so he only had to come in every 6 month instead of every 3 months. Wanted to make sure before cancelling appt.

## 2012-08-11 ENCOUNTER — Encounter: Payer: Self-pay | Admitting: *Deleted

## 2012-08-11 ENCOUNTER — Encounter: Payer: Self-pay | Admitting: Internal Medicine

## 2012-08-11 ENCOUNTER — Encounter: Payer: Self-pay | Admitting: Cardiology

## 2012-08-11 ENCOUNTER — Ambulatory Visit (INDEPENDENT_AMBULATORY_CARE_PROVIDER_SITE_OTHER): Payer: Medicare Other | Admitting: Cardiology

## 2012-08-11 DIAGNOSIS — I442 Atrioventricular block, complete: Secondary | ICD-10-CM

## 2012-08-11 LAB — PACEMAKER DEVICE OBSERVATION
BAMS-0003: 60 {beats}/min
DEVICE MODEL PM: 1394111

## 2012-08-11 NOTE — Progress Notes (Signed)
PPM check/device clinic visit. See PaceArt report. 

## 2012-08-11 NOTE — Patient Instructions (Addendum)
You have  a follow-up appointment scheduled for a device check Wednesday June 4,2014 at 10AM.

## 2012-08-31 ENCOUNTER — Encounter: Payer: Self-pay | Admitting: Family Medicine

## 2012-09-17 ENCOUNTER — Other Ambulatory Visit: Payer: Self-pay | Admitting: *Deleted

## 2012-09-17 DIAGNOSIS — I442 Atrioventricular block, complete: Secondary | ICD-10-CM

## 2012-09-17 MED ORDER — HYDROCHLOROTHIAZIDE 25 MG PO TABS: 25.0000 mg | ORAL_TABLET | Freq: Every day | ORAL | Status: DC

## 2012-11-11 ENCOUNTER — Ambulatory Visit (INDEPENDENT_AMBULATORY_CARE_PROVIDER_SITE_OTHER): Payer: Medicare Other | Admitting: *Deleted

## 2012-11-11 DIAGNOSIS — I442 Atrioventricular block, complete: Secondary | ICD-10-CM

## 2012-11-11 LAB — PACEMAKER DEVICE OBSERVATION
AL IMPEDENCE PM: 497 Ohm
ATRIAL PACING PM: 24
BAMS-0001: 180 {beats}/min
VENTRICULAR PACING PM: 100

## 2012-11-11 NOTE — Progress Notes (Signed)
PPM interrogation for battery longevity in office.

## 2012-11-25 ENCOUNTER — Encounter: Payer: Self-pay | Admitting: Internal Medicine

## 2012-12-17 DIAGNOSIS — I442 Atrioventricular block, complete: Secondary | ICD-10-CM

## 2013-01-20 ENCOUNTER — Encounter: Payer: Self-pay | Admitting: Internal Medicine

## 2013-02-19 ENCOUNTER — Encounter: Payer: Medicare Other | Admitting: Internal Medicine

## 2013-02-24 ENCOUNTER — Encounter: Payer: Self-pay | Admitting: Internal Medicine

## 2013-02-24 ENCOUNTER — Ambulatory Visit (INDEPENDENT_AMBULATORY_CARE_PROVIDER_SITE_OTHER): Payer: Medicare Other | Admitting: Internal Medicine

## 2013-02-24 VITALS — BP 158/78 | HR 61 | Ht 71.0 in | Wt 152.4 lb

## 2013-02-24 DIAGNOSIS — I442 Atrioventricular block, complete: Secondary | ICD-10-CM

## 2013-02-24 DIAGNOSIS — I10 Essential (primary) hypertension: Secondary | ICD-10-CM

## 2013-02-24 DIAGNOSIS — Z950 Presence of cardiac pacemaker: Secondary | ICD-10-CM

## 2013-02-24 LAB — PACEMAKER DEVICE OBSERVATION
AL AMPLITUDE: 3.4 mv
AL THRESHOLD: 0.5 V
BATTERY VOLTAGE: 2.65 V
VENTRICULAR PACING PM: 99

## 2013-02-24 NOTE — Progress Notes (Signed)
PCP: Nani Gasser, MD  Donald Patel is a 72 y.o. male who presents today for routine electrophysiology followup.  Since last being seen in our clinic, the patient reports doing very well.  Today, he denies symptoms of palpitations, chest pain, shortness of breath,  lower extremity edema, dizziness, presyncope, or syncope.  The patient is otherwise without complaint today.   Past Medical History  Diagnosis Date  . AV BLOCK, COMPLETE     s/p PPM (SJM)  . ERECTILE DYSFUNCTION, ORGANIC   . Hypertension    Past Surgical History  Procedure Laterality Date  . Knee surgery    . Vasectomy    . 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 Cataract extraction w/ intraocular lens implant      right eye    Current Outpatient Prescriptions  Medication Sig Dispense Refill  . hydrochlorothiazide (HYDRODIURIL) 25 MG tablet Take 1 tablet (25 mg total) by mouth daily.  30 tablet  5  . MULTIPLE VITAMIN tablet Take 1 tablet by mouth daily.      . vitamin C (ASCORBIC ACID) 500 MG tablet Take 1 tablet (500 mg total) by mouth daily.       No current facility-administered medications for this visit.    Physical Exam: Filed Vitals:   02/24/13 1221  BP: 158/78  Pulse: 61  Height: 5\' 11"  (1.803 m)  Weight: 152 lb 6.4 oz (69.128 kg)    GEN- The patient is well appearing, alert and oriented x 3 today.   Head- normocephalic, atraumatic Eyes-  Sclera clear, conjunctiva pink Ears- hearing intact Oropharynx- clear Lungs- Clear to ausculation bilaterally, normal work of breathing Chest- pacemaker pocket is well healed Heart- Regular rate and rhythm, no murmurs, rubs or gallops, PMI not laterally displaced GI- soft, NT, ND, + BS Extremities- no clubbing, cyanosis, or edema  Pacemaker interrogation- reviewed in detail today,  See PACEART report ekg today reveals AV sequential pacing  Assessment and  Plan:  AV BLOCK, COMPLETE  Normal pacemaker function  See Pace Art report  No changes today  0.5-0.75 years remaining on his battery He will return in 3 months to the device clinic  Essential hypertension, benign  Stable No change required today

## 2013-02-24 NOTE — Patient Instructions (Addendum)
Your physician recommends that you schedule a follow-up appointment in: 3 months with the device clinic and 12 months with Dr Johney Frame

## 2013-03-02 ENCOUNTER — Encounter: Payer: Self-pay | Admitting: Internal Medicine

## 2013-03-18 ENCOUNTER — Other Ambulatory Visit: Payer: Self-pay | Admitting: Internal Medicine

## 2013-03-18 DIAGNOSIS — I442 Atrioventricular block, complete: Secondary | ICD-10-CM

## 2013-03-20 ENCOUNTER — Other Ambulatory Visit: Payer: Self-pay | Admitting: Internal Medicine

## 2013-04-15 ENCOUNTER — Other Ambulatory Visit: Payer: Self-pay

## 2013-04-15 ENCOUNTER — Encounter: Payer: Self-pay | Admitting: Internal Medicine

## 2013-05-26 ENCOUNTER — Ambulatory Visit (INDEPENDENT_AMBULATORY_CARE_PROVIDER_SITE_OTHER): Payer: Medicare Other | Admitting: *Deleted

## 2013-05-26 DIAGNOSIS — I442 Atrioventricular block, complete: Secondary | ICD-10-CM

## 2013-05-26 LAB — MDC_IDC_ENUM_SESS_TYPE_INCLINIC
Battery Voltage: 2.61 V
Date Time Interrogation Session: 20141217111802
Implantable Pulse Generator Serial Number: 1394111
Lead Channel Impedance Value: 417 Ohm
Lead Channel Setting Pacing Pulse Width: 0.6 ms
Lead Channel Setting Sensing Sensitivity: 2 mV

## 2013-06-10 HISTORY — PX: CATARACT EXTRACTION W/ INTRAOCULAR LENS IMPLANT: SHX1309

## 2013-06-17 ENCOUNTER — Encounter: Payer: Self-pay | Admitting: Internal Medicine

## 2013-06-17 DIAGNOSIS — I442 Atrioventricular block, complete: Secondary | ICD-10-CM

## 2013-07-01 DIAGNOSIS — H2589 Other age-related cataract: Secondary | ICD-10-CM | POA: Insufficient documentation

## 2013-07-05 ENCOUNTER — Encounter: Payer: Self-pay | Admitting: Internal Medicine

## 2013-07-28 ENCOUNTER — Encounter: Payer: Self-pay | Admitting: *Deleted

## 2013-07-28 ENCOUNTER — Encounter: Payer: Self-pay | Admitting: Internal Medicine

## 2013-07-28 ENCOUNTER — Ambulatory Visit (INDEPENDENT_AMBULATORY_CARE_PROVIDER_SITE_OTHER): Payer: Medicare Other | Admitting: Internal Medicine

## 2013-07-28 ENCOUNTER — Encounter (HOSPITAL_COMMUNITY): Payer: Self-pay | Admitting: Pharmacy Technician

## 2013-07-28 VITALS — BP 144/84 | HR 79 | Ht 70.0 in | Wt 148.0 lb

## 2013-07-28 DIAGNOSIS — Z950 Presence of cardiac pacemaker: Secondary | ICD-10-CM

## 2013-07-28 DIAGNOSIS — I10 Essential (primary) hypertension: Secondary | ICD-10-CM

## 2013-07-28 DIAGNOSIS — Z45018 Encounter for adjustment and management of other part of cardiac pacemaker: Secondary | ICD-10-CM

## 2013-07-28 DIAGNOSIS — I442 Atrioventricular block, complete: Secondary | ICD-10-CM

## 2013-07-28 LAB — MDC_IDC_ENUM_SESS_TYPE_INCLINIC
Battery Impedance: 22100 Ohm
Battery Voltage: 2.55 V
Brady Statistic RV Percent Paced: 99 %
Date Time Interrogation Session: 20150218160850
Implantable Pulse Generator Serial Number: 1394111
Lead Channel Impedance Value: 469 Ohm
Lead Channel Pacing Threshold Amplitude: 0.5 V
Lead Channel Pacing Threshold Amplitude: 0.75 V
Lead Channel Pacing Threshold Pulse Width: 0.6 ms
Lead Channel Sensing Intrinsic Amplitude: 3.2 mV
Lead Channel Setting Pacing Amplitude: 2 V
Lead Channel Setting Pacing Amplitude: 2 V
Lead Channel Setting Pacing Pulse Width: 0.6 ms
MDC IDC MSMT LEADCHNL RV IMPEDANCE VALUE: 416 Ohm
MDC IDC MSMT LEADCHNL RV PACING THRESHOLD PULSEWIDTH: 0.6 ms
MDC IDC MSMT LEADCHNL RV SENSING INTR AMPL: 14.5 mV
MDC IDC SET LEADCHNL RV SENSING SENSITIVITY: 2 mV
MDC IDC STAT BRADY RA PERCENT PACED: 16 %

## 2013-07-28 LAB — CBC WITH DIFFERENTIAL/PLATELET
BASOS ABS: 0 10*3/uL (ref 0.0–0.1)
BASOS PCT: 0.4 % (ref 0.0–3.0)
Eosinophils Absolute: 0.2 10*3/uL (ref 0.0–0.7)
Eosinophils Relative: 2.5 % (ref 0.0–5.0)
HCT: 47.3 % (ref 39.0–52.0)
Hemoglobin: 15.9 g/dL (ref 13.0–17.0)
LYMPHS ABS: 1.7 10*3/uL (ref 0.7–4.0)
Lymphocytes Relative: 22.9 % (ref 12.0–46.0)
MCHC: 33.5 g/dL (ref 30.0–36.0)
MCV: 95.1 fl (ref 78.0–100.0)
MONOS PCT: 15.5 % — AB (ref 3.0–12.0)
Monocytes Absolute: 1.2 10*3/uL — ABNORMAL HIGH (ref 0.1–1.0)
NEUTROS ABS: 4.4 10*3/uL (ref 1.4–7.7)
Neutrophils Relative %: 58.7 % (ref 43.0–77.0)
Platelets: 159 10*3/uL (ref 150.0–400.0)
RBC: 4.97 Mil/uL (ref 4.22–5.81)
RDW: 13.3 % (ref 11.5–14.6)
WBC: 7.5 10*3/uL (ref 4.5–10.5)

## 2013-07-28 LAB — BASIC METABOLIC PANEL
BUN: 12 mg/dL (ref 6–23)
CHLORIDE: 98 meq/L (ref 96–112)
CO2: 28 meq/L (ref 19–32)
Calcium: 9.2 mg/dL (ref 8.4–10.5)
Creatinine, Ser: 0.9 mg/dL (ref 0.4–1.5)
GFR: 83.49 mL/min (ref >60.00)
GLUCOSE: 85 mg/dL (ref 70–99)
Potassium: 3.4 mEq/L — ABNORMAL LOW (ref 3.5–5.1)
SODIUM: 135 meq/L (ref 135–145)

## 2013-07-28 MED ORDER — SODIUM CHLORIDE 0.9 % IR SOLN
80.0000 mg | Status: AC
  Filled 2013-07-28: qty 2

## 2013-07-28 MED ORDER — CEFAZOLIN SODIUM-DEXTROSE 2-3 GM-% IV SOLR
2.0000 g | INTRAVENOUS | Status: AC
  Filled 2013-07-28: qty 50

## 2013-07-28 NOTE — Patient Instructions (Signed)
Pacemaker generator change   See instruction sheet

## 2013-07-28 NOTE — Progress Notes (Signed)
PCP: METHENEY,CATHERINE, MD  Donald Patel is a 73 y.o. male who presents today for routine electrophysiology followup.  Since last being seen in our clinic, the patient reports doing very well.   He has been using his snow blower this week without difficulty. Today, he denies symptoms of palpitations, chest pain, shortness of breath,  lower extremity edema, dizziness, presyncope, or syncope.  The patient is otherwise without complaint today.   Past Medical History  Diagnosis Date  . AV BLOCK, COMPLETE     s/p PPM (SJM)  . ERECTILE DYSFUNCTION, ORGANIC   . Hypertension    Past Surgical History  Procedure Laterality Date  . Knee surgery    . Vasectomy    . 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 Cataract extraction w/ intraocular lens implant      right eye    Current Outpatient Prescriptions  Medication Sig Dispense Refill  . hydrochlorothiazide (HYDRODIURIL) 25 MG tablet TAKE 1 TABLET (25 MG TOTAL) BY MOUTH DAILY.  30 tablet  5  . MULTIPLE VITAMIN tablet Take 1 tablet by mouth daily.      . prednisoLONE acetate (PRED FORTE) 1 % ophthalmic suspension Place 1 drop into the left eye 2 (two) times daily.      . vitamin C (ASCORBIC ACID) 500 MG tablet Take 1 tablet (500 mg total) by mouth daily.       No current facility-administered medications for this visit.    Physical Exam: Filed Vitals:   07/28/13 1123  BP: 144/84  Pulse: 79  Height: 5' 10" (1.778 m)  Weight: 148 lb (67.132 kg)    GEN- The patient is well appearing, alert and oriented x 3 today.   Head- normocephalic, atraumatic Eyes-  Sclera clear, conjunctiva pink Ears- hearing intact Oropharynx- clear Lungs- Clear to ausculation bilaterally, normal work of breathing Chest- pacemaker pocket is well healed Heart- Regular rate and rhythm, no murmurs, rubs or gallops, PMI not laterally displaced GI- soft, NT, ND, +  BS Extremities- no clubbing, cyanosis, or edema  Pacemaker interrogation- reviewed in detail today,  See PACEART report  Assessment and Plan:  AV BLOCK, COMPLETE  He has reached ERI battery status Risks, benefits, and alternatives to PPM pulse generator replacement were discussed at length with the patient who wishes to proceed. We will schedule the procedure at the next available time  Essential hypertension, benign  Stable No change required today       

## 2013-07-29 ENCOUNTER — Ambulatory Visit (HOSPITAL_COMMUNITY)
Admission: RE | Admit: 2013-07-29 | Discharge: 2013-07-29 | Disposition: A | Discharge status: Home / Self Care | Payer: Medicare Other | Source: Ambulatory Visit | Attending: Internal Medicine | Admitting: Internal Medicine

## 2013-07-29 ENCOUNTER — Encounter: Payer: Self-pay | Admitting: Internal Medicine

## 2013-07-29 ENCOUNTER — Encounter (HOSPITAL_COMMUNITY)
Admission: RE | Disposition: A | Discharge status: Home / Self Care | Payer: Self-pay | Source: Ambulatory Visit | Attending: Internal Medicine

## 2013-07-29 DIAGNOSIS — I10 Essential (primary) hypertension: Secondary | ICD-10-CM | POA: Insufficient documentation

## 2013-07-29 DIAGNOSIS — Z45018 Encounter for adjustment and management of other part of cardiac pacemaker: Secondary | ICD-10-CM | POA: Insufficient documentation

## 2013-07-29 DIAGNOSIS — N529 Male erectile dysfunction, unspecified: Secondary | ICD-10-CM | POA: Insufficient documentation

## 2013-07-29 DIAGNOSIS — IMO0002 Reserved for concepts with insufficient information to code with codable children: Secondary | ICD-10-CM | POA: Insufficient documentation

## 2013-07-29 DIAGNOSIS — Z950 Presence of cardiac pacemaker: Secondary | ICD-10-CM | POA: Diagnosis present

## 2013-07-29 DIAGNOSIS — I442 Atrioventricular block, complete: Secondary | ICD-10-CM

## 2013-07-29 HISTORY — PX: PERMANENT PACEMAKER GENERATOR CHANGE: SHX6022

## 2013-07-29 LAB — SURGICAL PCR SCREEN
MRSA, PCR: NEGATIVE
STAPHYLOCOCCUS AUREUS: POSITIVE — AB

## 2013-07-29 SURGERY — PERMANENT PACEMAKER GENERATOR CHANGE
Anesthesia: LOCAL

## 2013-07-29 MED ORDER — MUPIROCIN 2 % EX OINT
TOPICAL_OINTMENT | CUTANEOUS | Status: AC
  Filled 2013-07-29: qty 22

## 2013-07-29 MED ORDER — SODIUM CHLORIDE 0.9 % IV SOLN
INTRAVENOUS | Status: DC
  Administered 2013-07-29: 14:00:00 via INTRAVENOUS

## 2013-07-29 MED ORDER — MUPIROCIN 2 % EX OINT
TOPICAL_OINTMENT | Freq: Two times a day (BID) | CUTANEOUS | Status: DC
  Administered 2013-07-29: 1 via NASAL
  Filled 2013-07-29: qty 22

## 2013-07-29 MED ORDER — ONDANSETRON HCL 4 MG/2ML IJ SOLN: 4.0000 mg | Freq: Four times a day (QID) | INTRAMUSCULAR | Status: DC | PRN

## 2013-07-29 MED ORDER — SODIUM CHLORIDE 0.9 % IJ SOLN: 3.0000 mL | Freq: Two times a day (BID) | INTRAMUSCULAR | Status: DC

## 2013-07-29 MED ORDER — SODIUM CHLORIDE 0.9 % IJ SOLN: 3.0000 mL | INTRAMUSCULAR | Status: DC | PRN

## 2013-07-29 MED ORDER — ACETAMINOPHEN 325 MG PO TABS: 325.0000 mg | ORAL_TABLET | ORAL | Status: DC | PRN

## 2013-07-29 MED ORDER — LIDOCAINE HCL (PF) 1 % IJ SOLN
INTRAMUSCULAR | Status: AC
  Filled 2013-07-29: qty 60

## 2013-07-29 MED ORDER — SODIUM CHLORIDE 0.9 % IV SOLN: 250.0000 mL | INTRAVENOUS | Status: DC | PRN

## 2013-07-29 MED ORDER — CEFAZOLIN SODIUM 1-5 GM-% IV SOLN
INTRAVENOUS | Status: AC
Start: 2013-07-29 — End: 2013-07-29
  Filled 2013-07-29: qty 100

## 2013-07-29 MED ORDER — HYDROCODONE-ACETAMINOPHEN 5-325 MG PO TABS: 1.0000 | ORAL_TABLET | ORAL | Status: DC | PRN

## 2013-07-29 NOTE — Discharge Instructions (Signed)
° °  Supplemental Discharge Instructions following  Pacemaker Generator Change  WOUND CARE   Keep the wound area clean and dry.  Do not get this area wet for one week. No showers for one week; you may shower on 08/06/2013.   The tape/steri-strips on your wound will fall off; do not pull them off.  No bandage is needed on the site.  DO  NOT apply any creams, oils, or ointments to the wound area.   If you notice any drainage or discharge from the wound, any swelling or bruising at the site, or you develop a fever > 101? F after you are discharged home, call the office at once.   Pacemaker Battery Change, Care After    Refer to this sheet in the next few weeks. These instructions provide you with information on caring for yourself after your procedure. Your health care provider may also give you more specific instructions. Your treatment has been planned according to current medical practices, but problems sometimes occur. Call your health care provider if you have any problems or questions after your procedure. WHAT TO EXPECT AFTER THE PROCEDURE After your procedure, it is typical to have the following sensations:  Soreness at the pacemaker site. HOME CARE INSTRUCTIONS   Keep the incision clean and dry.  Unless advised otherwise, you may shower beginning 48 hours after your procedure.  For the first week after the replacement, avoid stretching motions that pull at the incision site and avoid heavy exercise with the arm on the same side as the incision.  Only take over-the-counter or prescription medicines for pain, discomfort, or fever as directed by your health care provider.  Your health care provider will tell you when you will need to next test your pacemaker by telephone or when to return to the office for follow up for removal of stitches. SEEK MEDICAL CARE IF:   You have pain at the incision site that is not relieved by over-the-counter or prescription medicine.  There is drainage  or pus from the incision site.  There is swelling larger than a lime at the incision site.  You develop red streaking that extends above or below the incision site.  You feel brief, intermittent palpitations, lightheadedness, or any symptoms that you feel might be related to your heart. SEEK IMMEDIATE MEDICAL CARE IF:   You experience chest pain that is different than the pain at the pacemaker site.  Shortness of breath.  Palpitations or irregular heart beat.  Lightheadedness that does not go away quickly.  Fainting.  You have pain that gets worse and is not relieved by medicine. MAKE SURE YOU:   Understand these instructions.  Will watch your condition.  Will get help right away if you are not doing well or get worse. Document Released: 03/17/2013 Document Reviewed: 12/09/2012 [facility] Patient Information 2014 [place], [place].

## 2013-07-29 NOTE — Progress Notes (Signed)
Discharge instruction given per MD order.  Pt and CG able to veralize understanding.  Pt to car via wheelchair.

## 2013-07-29 NOTE — Op Note (Addendum)
SURGEON:  [name], MD     PREPROCEDURE DIAGNOSES:   1. Complete heart block     POSTPROCEDURE DIAGNOSES:   1. Complete heart block      PROCEDURES:   1. Pacemaker pulse generator replacement.   2. Skin pocket revision.     INTRODUCTION:  Donald Patel is a 73 y.o. male with a history of complete heart block. He has done well since his pacemaker was implanted.  He has recently reached ERI battery status.  He presents today for pacemaker pulse generator replacement.       DESCRIPTION OF THE PROCEDURE:  Informed written consent was obtained, and the patient was brought to the electrophysiology lab in the fasting state.  The patient's pacemaker was interrogated today and found to be at elective replacement indicator battery status.  No sedation was required for the procedure today.  The patient's left chest was prepped and draped in the usual sterile fashion by the EP lab staff.  The skin overlying the existing pacemaker was infiltrated with lidocaine for local analgesia.  A 4-cm incision was made over the pacemaker pocket.  Using a combination of sharp and blunt dissection, the pacemaker was exposed and removed from the body.  The device was disconnected from the leads.  There was no foreign matter or debris within the pocket.  The atrial lead was confirmed to be a [place] [id number] (serial number [id number]) lead implanted on 12/22/1996.  The right ventricular lead was confirmed to be a  [place] [id number] (serial number [id number]) lead implanted on the same date as the atrial lead (above).  Both leads were examined and their integrity was confirmed to be intact.  Atrial lead P-waves measured 5 mV with impedance of 480 ohms and a threshold of 0.5 V at 0.5 msec.  Right ventricular lead R-waves were absent.  Right ventricular lead impedance was 560 ohms and a threshold of 0.7 V at 0.5 msec.  Both leads were connected to a  [place] (serial number  [id number]) pacemaker.  The pocket was revised to accommodate this new device.  Electrocautery was required to assure hemostasis.  The pocket was irrigated with copious gentamicin solution. The pacemaker was then placed into the pocket.  The pocket was then closed in 2 layers with 2-0 Vicryl suture over the subcutaneous and subcuticular layers.  Steri-Strips and a sterile dressing were then applied.  There were no early apparent complications.     CONCLUSIONS:   1. Successful pacemaker pulse generator replacement for elective replacement indicator battery status   2. No early apparent complications.     [name], MD 07/29/2013 3:46 PM

## 2013-07-29 NOTE — Interval H&P Note (Signed)
History and Physical Interval Note:  07/29/2013 2:35 PM  Donald Patel  has presented today for surgery, with the diagnosis of end of life  The various methods of treatment have been discussed with the patient and family. After consideration of risks, benefits and other options for treatment, the patient has consented to  Procedure(s): PERMANENT PACEMAKER GENERATOR CHANGE (N/A) as a surgical intervention .  The patient's history has been reviewed, patient examined, no change in status, stable for surgery.  I have reviewed the patient's chart and labs.  Questions were answered to the patient's satisfaction.     [name]

## 2013-07-29 NOTE — Progress Notes (Signed)
Received pt form cath procedure.  Pt denies any discomfort at this time.

## 2013-07-29 NOTE — H&P (View-Only) (Signed)
PCP: Beatrice Lecher, MD  Donald Patel is a 73 y.o. male who presents today for routine electrophysiology followup.  Since last being seen in our clinic, the patient reports doing very well.   He has been using his snow blower this week without difficulty. Today, he denies symptoms of palpitations, chest pain, shortness of breath,  lower extremity edema, dizziness, presyncope, or syncope.  The patient is otherwise without complaint today.   Past Medical History  Diagnosis Date  . AV BLOCK, COMPLETE     s/p PPM (SJM)  . ERECTILE DYSFUNCTION, ORGANIC   . Hypertension    Past Surgical History  Procedure Laterality Date  . Knee surgery    . Vasectomy    . Pacemaker placement  12/22/96    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, most recent generator (SJM) 05/02/04 both implanted by Dr Rebecka Apley [facility]  . Cataract extraction w/ intraocular lens implant      right eye    Current Outpatient Prescriptions  Medication Sig Dispense Refill  . hydrochlorothiazide (HYDRODIURIL) 25 MG tablet TAKE 1 TABLET (25 MG TOTAL) BY MOUTH DAILY.  30 tablet  5  . MULTIPLE VITAMIN tablet Take 1 tablet by mouth daily.      . prednisoLONE acetate (PRED FORTE) 1 % ophthalmic suspension Place 1 drop into the left eye 2 (two) times daily.      . vitamin C (ASCORBIC ACID) 500 MG tablet Take 1 tablet (500 mg total) by mouth daily.       No current facility-administered medications for this visit.    Physical Exam: Filed Vitals:   07/28/13 1123  BP: 144/84  Pulse: 79  Height: 5\' 10"  (1.778 m)  Weight: 148 lb (67.132 kg)    GEN- The patient is well appearing, alert and oriented x 3 today.   Head- normocephalic, atraumatic Eyes-  Sclera clear, conjunctiva pink Ears- hearing intact Oropharynx- clear Lungs- Clear to ausculation bilaterally, normal work of breathing Chest- pacemaker pocket is well healed Heart- Regular rate and rhythm, no murmurs, rubs or gallops, PMI not laterally displaced GI- soft, NT, ND, +  BS Extremities- no clubbing, cyanosis, or edema  Pacemaker interrogation- reviewed in detail today,  See PACEART report  Assessment and Plan:  AV BLOCK, COMPLETE  He has reached ERI battery status Risks, benefits, and alternatives to PPM pulse generator replacement were discussed at length with the patient who wishes to proceed. We will schedule the procedure at the next available time  Essential hypertension, benign  Stable No change required today

## 2013-08-04 ENCOUNTER — Telehealth: Payer: Self-pay | Admitting: *Deleted

## 2013-08-04 NOTE — Telephone Encounter (Signed)
Pt has obsolete [employer] equipment. Gave him # to contact [employer] for equipment resolution.

## 2013-08-09 ENCOUNTER — Ambulatory Visit (INDEPENDENT_AMBULATORY_CARE_PROVIDER_SITE_OTHER): Payer: Medicare Other | Admitting: *Deleted

## 2013-08-09 DIAGNOSIS — Z950 Presence of cardiac pacemaker: Secondary | ICD-10-CM

## 2013-08-09 DIAGNOSIS — I442 Atrioventricular block, complete: Secondary | ICD-10-CM

## 2013-08-09 LAB — MDC_IDC_ENUM_SESS_TYPE_INCLINIC
Battery Voltage: 3.16 V
Brady Statistic RA Percent Paced: 12 %
Date Time Interrogation Session: 20150302180749
Implantable Pulse Generator Model: 2240
Implantable Pulse Generator Serial Number: 7596156
Lead Channel Impedance Value: 450 Ohm
Lead Channel Impedance Value: 575 Ohm
Lead Channel Pacing Threshold Amplitude: 0.5 V
Lead Channel Pacing Threshold Pulse Width: 0.5 ms
Lead Channel Pacing Threshold Pulse Width: 0.5 ms
Lead Channel Pacing Threshold Pulse Width: 0.5 ms
Lead Channel Sensing Intrinsic Amplitude: 5 mV
Lead Channel Setting Pacing Amplitude: 1 V
Lead Channel Setting Pacing Amplitude: 2 V
Lead Channel Setting Pacing Pulse Width: 0.5 ms
Lead Channel Setting Sensing Sensitivity: 5 mV
MDC IDC MSMT BATTERY REMAINING LONGEVITY: 129.6 mo
MDC IDC MSMT LEADCHNL RA PACING THRESHOLD AMPLITUDE: 0.5 V
MDC IDC MSMT LEADCHNL RV PACING THRESHOLD AMPLITUDE: 0.875 V
MDC IDC STAT BRADY RV PERCENT PACED: 99.2 %

## 2013-08-09 NOTE — Progress Notes (Signed)
Wound check appointment. Steri-strips removed. Wound without redness or edema. Incision edges approximated, wound well healed. Normal device function. Thresholds, sensing, and impedances consistent with implant measurements. Device programmed at chronic lead settings. Histogram distribution appropriate for patient and level of activity. 5 mode switches--- <1%, longest 73min 6sec. No high ventricular rates noted. Patient educated about wound care, arm mobility, lifting restrictions.   ROV w/ Dr. Rayann Heman 11/10/13 @ 11:45.

## 2013-08-19 ENCOUNTER — Encounter: Payer: Self-pay | Admitting: Internal Medicine

## 2013-08-23 ENCOUNTER — Telehealth: Payer: Self-pay | Admitting: Internal Medicine

## 2013-08-23 NOTE — Telephone Encounter (Signed)
I will forward to dr/nurse 

## 2013-08-23 NOTE — Telephone Encounter (Signed)
New message     Have we faxed the attending physician form to his ins company.  It is like an [place] that pays him directly.  The physician would have had to complete this form

## 2013-08-24 NOTE — Telephone Encounter (Signed)
Form completed and have given to Maudie Mercury in Medical records

## 2013-08-24 NOTE — Telephone Encounter (Signed)
Attending Physicians [employer] Faxed to [phone] Mailed Original To pt

## 2013-10-03 ENCOUNTER — Encounter: Payer: Self-pay | Admitting: Internal Medicine

## 2013-10-20 ENCOUNTER — Encounter: Payer: Self-pay | Admitting: Family Medicine

## 2013-10-20 ENCOUNTER — Ambulatory Visit (INDEPENDENT_AMBULATORY_CARE_PROVIDER_SITE_OTHER): Payer: Medicare Other | Admitting: Family Medicine

## 2013-10-20 VITALS — BP 149/82 | HR 71 | Ht 70.0 in | Wt 148.0 lb

## 2013-10-20 DIAGNOSIS — Z0000 Encounter for general adult medical examination without abnormal findings: Secondary | ICD-10-CM

## 2013-10-20 DIAGNOSIS — Z125 Encounter for screening for malignant neoplasm of prostate: Secondary | ICD-10-CM

## 2013-10-20 DIAGNOSIS — E785 Hyperlipidemia, unspecified: Secondary | ICD-10-CM

## 2013-10-20 LAB — COMPLETE METABOLIC PANEL WITH GFR
ALK PHOS: 67 U/L (ref 39–117)
ALT: 15 U/L (ref 0–53)
AST: 19 U/L (ref 0–37)
Albumin: 4.3 g/dL (ref 3.5–5.2)
BILIRUBIN TOTAL: 0.9 mg/dL (ref 0.2–1.2)
BUN: 16 mg/dL (ref 6–23)
CO2: 27 mEq/L (ref 19–32)
Calcium: 9.6 mg/dL (ref 8.4–10.5)
Chloride: 99 mEq/L (ref 96–112)
Creat: 0.99 mg/dL (ref 0.50–1.35)
GFR, Est African American: 87 mL/min
GFR, Est Non African American: 75 mL/min
Glucose, Bld: 96 mg/dL (ref 70–99)
Potassium: 3.7 mEq/L (ref 3.5–5.3)
SODIUM: 138 meq/L (ref 135–145)
Total Protein: 7.2 g/dL (ref 6.0–8.3)

## 2013-10-20 LAB — LIPID PANEL
CHOL/HDL RATIO: 3.3 ratio
Cholesterol: 193 mg/dL (ref 0–200)
HDL: 58 mg/dL (ref >39)
LDL CALC: 115 mg/dL — AB (ref 0–99)
Triglycerides: 100 mg/dL (ref <150)
VLDL: 20 mg/dL (ref 0–40)

## 2013-10-20 NOTE — Progress Notes (Signed)
Subjective:    Patient ID: Donald Patel, male    DOB: 1940-06-13, 73 y.o.   MRN: [id number]  HPI   Review of Systems     Objective:   Physical Exam        Assessment & Plan:   Subjective:    Donald Patel is a 73 y.o. male who presents for Medicare Annual/Subsequent preventive examination.   Preventive Screening-Counseling & Management  Tobacco History  Smoking status  . Former Smoker  . Quit date: 06/10/1985  Smokeless tobacco  . Not on file    Problems Prior to Visit 1.   Current Problems (verified) Patient Active Problem List   Diagnosis Date Noted  . 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 AV BLOCK, COMPLETE 07/20/2008  . Essential hypertension, benign 02/25/2008  . 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    Medications Prior to Visit Current Outpatient Prescriptions on File Prior to Visit  Medication Sig Dispense Refill  . hydrochlorothiazide (HYDRODIURIL) 25 MG tablet Take 25 mg by mouth daily.       No current facility-administered medications on file prior to visit.    Current Medications (verified) Current Outpatient Prescriptions  Medication Sig Dispense Refill  . hydrochlorothiazide (HYDRODIURIL) 25 MG tablet Take 25 mg by mouth daily.       No current facility-administered medications for this visit.     Allergies (verified) Review of patient's allergies indicates no known allergies.   PAST HISTORY  Family History Family History  Problem Relation Age of Onset  . Heart attack    . Diabetes      Social History History  Substance Use Topics  . Smoking status: Former Smoker    Quit date: 06/10/1985  . Smokeless tobacco: Not on file  . Alcohol Use: No    Are there smokers in your home (other than you)?  No  Risk Factors Current exercise habits: plays golf  Dietary issues discussed: none   Cardiac risk factors: advanced age (older than 31 for men, 73 for women).  Depression Screen (Note: if answer to either of the  following is "Yes", a more complete depression screening is indicated)   Q1: Over the past two weeks, have you felt down, depressed or hopeless? No  Q2: Over the past two weeks, have you felt little interest or pleasure in doing things? No  Have you lost interest or pleasure in daily life? No  Do you often feel hopeless? No  Do you cry easily over simple problems? No  Activities of Daily Living In your present state of health, do you have any difficulty performing the following activities?:  Driving? No Managing money?  No Feeding yourself? No Getting from bed to chair? No Climbing a flight of stairs? No Preparing food and eating?: No Bathing or showering? No Getting dressed: No Getting to the toilet? No Using the toilet:No Moving around from place to place: No In the past year have you fallen or had a near fall?:No   Are you sexually active?  No  Do you have more than one partner?  No  Hearing Difficulties: No Do you often ask people to speak up or repeat themselves? No Do you experience ringing or noises in your ears? Yes Do you have difficulty understanding soft or whispered voices? No   Do you feel that you have a problem with memory? No  Do you often misplace items? No  Do you feel safe at home?  Yes  Cognitive Testing  Alert? Yes  Normal  Appearance?Yes  Oriented to person? Yes  Place? Yes   Time? Yes  Recall of three objects?  Yes  Can perform simple calculations? Yes  Displays appropriate judgment?Yes  Can read the correct time from a watch face?Yes   Advanced Directives have been discussed with the patient? Yes   List the Names of Other Physician/Practitioners you currently use: 1.    Indicate any recent Medical Services you may have received from other than [facility] providers in the past year (date may be approximate).  Immunization History  Administered Date(s) Administered  . Influenza Whole 08/12/1997  . Pneumococcal Polysaccharide-23 05/19/2009  . Td  02/25/2008  . Zoster 05/25/2010    Screening Tests Health Maintenance  Topic Date Due  . Influenza Vaccine  01/08/2014  . Colonoscopy  08/08/2015  . Tetanus/tdap  02/24/2018  . Pneumococcal Polysaccharide Vaccine Age 70 And Over  Completed  . Zostavax  Addressed    All answers were reviewed with the patient and necessary referrals were made:  Danijela Vessey, MD   10/20/2013   History reviewed: allergies, current medications, past family history, past medical history, past social history, past surgical history and problem list  Review of Systems A comprehensive review of systems was negative.    Objective:     Vision by Snellen chart: Had eye exam done in Jan 2015.  Blood pressure 155/71, pulse 71, height 5\' 10"  (1.778 m), weight 148 lb (67.132 kg). Body mass index is 21.24 kg/(m^2).  BP 155/71  Pulse 71  Ht 5\' 10"  (1.778 m)  Wt 148 lb (67.132 kg)  BMI 21.24 kg/m2  General Appearance:    Alert, cooperative, no distress, appears stated age  Head:    Normocephalic, without obvious abnormality, atraumatic  Eyes:    PERRL, conjunctiva/corneas clear, EOM's intact, both eyes       Ears:    Normal TM's and external ear canals, both ears  Nose:   Nares normal, septum midline, mucosa normal, no drainage    or sinus tenderness  Throat:   Lips, mucosa, and tongue normal; teeth and gums normal  Neck:   Supple, symmetrical, trachea midline, no adenopathy;       thyroid:  No enlargement/tenderness/nodules; no carotid   bruit or JVD  Back:     Symmetric, no curvature, ROM normal, no CVA tenderness  Lungs:     Clear to auscultation bilaterally, respirations unlabored  Chest wall:    No tenderness or deformity  Heart:    Regular rate and rhythm, S1 and S2 normal, no murmur, rub   or gallop  Abdomen:     Soft, non-tender, bowel sounds active all four quadrants,    no masses, no organomegaly  Genitalia:    Not performed  Rectal:    Not performed  Extremities:   Extremities normal,  atraumatic, no cyanosis or edema  Pulses:   2+ and symmetric all extremities  Skin:   Skin color, texture, turgor normal, no rashes or lesions  Lymph nodes:   Cervical, supraclavicular, and axillary nodes normal  Neurologic:   CNII-XII intact. Normal strength, sensation and reflexes      throughout       Assessment:     MEDICARE WELLNESS EXAM    Plan:     During the course of the visit the patient was educated and counseled about appropriate screening and preventive services including:    Prenvar 13 - handout provided  Eye exam is up to date  HTN- just borderline today but  says hom eBPs look good.  F/U in 6 moths.    Diet review for nutrition referral? Yes ____  Not Indicated X__   Patient Instructions (the written plan) was given to the patient.  Medicare Attestation I have personally reviewed: The patient's medical and social history Their use of alcohol, tobacco or illicit drugs Their current medications and supplements The patient's functional ability including ADLs,fall risks, home safety risks, cognitive, and hearing and visual impairment Diet and physical activities Evidence for depression or mood disorders  The patient's weight, height, BMI, and visual acuity have been recorded in the chart.  I have made referrals, counseling, and provided education to the patient based on review of the above and I have provided the patient with a written personalized care plan for preventive services.     Jahleah Mariscal, MD   10/20/2013

## 2013-10-20 NOTE — Patient Instructions (Signed)
Keep up a regular exercise program and make sure you are eating a healthy diet Try to eat 4 servings of dairy a day, or if you are lactose intolerant take a calcium with vitamin D daily.  Your vaccines are up to date.   

## 2013-10-21 LAB — PSA: PSA: 0.58 ng/mL (ref <=4.00)

## 2013-10-21 NOTE — Addendum Note (Signed)
Addended by: Beatrice Lecher D on: 10/21/2013 05:39 PM   Modules accepted: Level of Service

## 2013-11-10 ENCOUNTER — Encounter: Payer: Self-pay | Admitting: Internal Medicine

## 2013-11-10 ENCOUNTER — Ambulatory Visit (INDEPENDENT_AMBULATORY_CARE_PROVIDER_SITE_OTHER): Payer: Medicare Other | Admitting: Internal Medicine

## 2013-11-10 VITALS — BP 144/80 | HR 60 | Ht 70.5 in | Wt 149.6 lb

## 2013-11-10 DIAGNOSIS — I10 Essential (primary) hypertension: Secondary | ICD-10-CM

## 2013-11-10 DIAGNOSIS — I442 Atrioventricular block, complete: Secondary | ICD-10-CM

## 2013-11-10 DIAGNOSIS — Z950 Presence of cardiac pacemaker: Secondary | ICD-10-CM

## 2013-11-10 LAB — MDC_IDC_ENUM_SESS_TYPE_INCLINIC
Battery Voltage: 3.01 V
Brady Statistic RV Percent Paced: 99 %
Implantable Pulse Generator Serial Number: 7596156
Lead Channel Impedance Value: 440 Ohm
Lead Channel Pacing Threshold Amplitude: 0.5 V
Lead Channel Pacing Threshold Pulse Width: 0.5 ms
Lead Channel Setting Pacing Amplitude: 1 V
Lead Channel Setting Pacing Amplitude: 2 V
Lead Channel Setting Pacing Pulse Width: 0.5 ms
MDC IDC MSMT LEADCHNL RA PACING THRESHOLD PULSEWIDTH: 0.5 ms
MDC IDC MSMT LEADCHNL RA SENSING INTR AMPL: 5 mV
MDC IDC MSMT LEADCHNL RV IMPEDANCE VALUE: 560 Ohm
MDC IDC MSMT LEADCHNL RV PACING THRESHOLD AMPLITUDE: 1 V
MDC IDC SET LEADCHNL RV SENSING SENSITIVITY: 5 mV
MDC IDC STAT BRADY RA PERCENT PACED: 19 %

## 2013-11-10 MED ORDER — HYDROCHLOROTHIAZIDE 25 MG PO TABS
25.0000 mg | ORAL_TABLET | Freq: Every day | ORAL | Status: DC
Start: 2013-11-10 — End: 2014-11-04

## 2013-11-10 NOTE — Patient Instructions (Addendum)
Remote monitoring is used to monitor your Pacemaker of ICD from home. This monitoring reduces the number of office visits required to check your device to one time per year. It allows [place] to keep an eye on the functioning of your device to ensure it is working properly. You are scheduled for a device check from home on February 09, 2014. You may send your transmission at any time that day. If you have a wireless device, the transmission will be sent automatically. After your physician reviews your transmission, you will receive a postcard with your next transmission date.  Your physician wants you to follow-up in: 1 year with Dr Rayann Heman.  You will receive a reminder letter in the mail two months in advance. If you don't receive a letter, please call our office to schedule the follow-up appointment.

## 2013-11-10 NOTE — Progress Notes (Signed)
PCP: Beatrice Lecher, MD   Donald Patel is a 73 y.o. male who presents today for routine electrophysiology followup.  Since recent generator change, the patient reports doing very well.   He is following with Dr Madilyn Fireman for hypertension.   Today, he denies symptoms of palpitations, chest pain, shortness of breath,  lower extremity edema, dizziness, presyncope, or syncope.  The patient is otherwise without complaint today.   Past Medical History  Diagnosis Date  . AV BLOCK, COMPLETE     s/p PPM (SJM)  . ERECTILE DYSFUNCTION, ORGANIC   . Hypertension    Past Surgical History  Procedure Laterality Date  . Knee surgery    . Vasectomy    . Pacemaker placement  12/22/96    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, most recent generator (SJM) 05/02/04 both implanted by Dr Rebecka Apley [facility]  . Cataract extraction w/ intraocular lens implant  2013    right eye  . Cataract extraction w/ intraocular lens implant  2015    Left eye    Current Outpatient Prescriptions  Medication Sig Dispense Refill  . hydrochlorothiazide (HYDRODIURIL) 25 MG tablet Take 25 mg by mouth daily.       No current facility-administered medications for this visit.    Physical Exam: Filed Vitals:   11/10/13 1148  BP: 144/80  Pulse: 60  Height: 5' 10.5" (1.791 m)  Weight: 149 lb 9.6 oz (67.858 kg)    GEN- The patient is well appearing, alert and oriented x 3 today.   Head- normocephalic, atraumatic Eyes-  Sclera clear, conjunctiva pink Ears- hearing intact Oropharynx- clear Lungs- Clear to ausculation bilaterally, normal work of breathing Chest- pacemaker pocket is well healed Heart- Regular rate and rhythm, no murmurs, rubs or gallops, PMI not laterally displaced GI- soft, NT, ND, + BS Extremities- no clubbing, cyanosis, or edema  Pacemaker interrogation- reviewed in detail today,  See PACEART report ekg today reveals AV sequential pacing  Assessment and Plan:  1. Complete heart block Normal pacemaker  function See Pace Art report No changes today  2. htn Stable No change required today  Merlin Return to see me in 1 year

## 2013-12-14 ENCOUNTER — Encounter: Payer: Self-pay | Admitting: Internal Medicine

## 2014-02-09 ENCOUNTER — Ambulatory Visit (INDEPENDENT_AMBULATORY_CARE_PROVIDER_SITE_OTHER): Payer: Medicare Other | Admitting: *Deleted

## 2014-02-09 ENCOUNTER — Encounter: Payer: Self-pay | Admitting: Internal Medicine

## 2014-02-09 DIAGNOSIS — I442 Atrioventricular block, complete: Secondary | ICD-10-CM

## 2014-02-09 NOTE — Progress Notes (Signed)
Remote pacemaker transmission.   

## 2014-02-11 LAB — MDC_IDC_ENUM_SESS_TYPE_REMOTE
Battery Remaining Longevity: 118 mo
Brady Statistic AP VS Percent: 1 %
Brady Statistic AS VS Percent: 1 %
Lead Channel Pacing Threshold Amplitude: 0.5 V
Lead Channel Pacing Threshold Amplitude: 0.75 V
Lead Channel Pacing Threshold Pulse Width: 0.5 ms
Lead Channel Pacing Threshold Pulse Width: 0.5 ms
Lead Channel Sensing Intrinsic Amplitude: 9.9 mV
Lead Channel Setting Pacing Amplitude: 1 V
Lead Channel Setting Pacing Amplitude: 2 V
MDC IDC MSMT BATTERY REMAINING PERCENTAGE: 95.5 %
MDC IDC MSMT BATTERY VOLTAGE: 3.01 V
MDC IDC MSMT LEADCHNL RA IMPEDANCE VALUE: 430 Ohm
MDC IDC MSMT LEADCHNL RA SENSING INTR AMPL: 5 mV
MDC IDC MSMT LEADCHNL RV IMPEDANCE VALUE: 560 Ohm
MDC IDC PG SERIAL: 7596156
MDC IDC SESS DTM: 20150902060009
MDC IDC SET LEADCHNL RV PACING PULSEWIDTH: 0.5 ms
MDC IDC SET LEADCHNL RV SENSING SENSITIVITY: 5 mV
MDC IDC STAT BRADY AP VP PERCENT: 20 %
MDC IDC STAT BRADY AS VP PERCENT: 80 %
MDC IDC STAT BRADY RA PERCENT PACED: 19 %
MDC IDC STAT BRADY RV PERCENT PACED: 99 %

## 2014-02-16 ENCOUNTER — Encounter: Payer: Self-pay | Admitting: Cardiology

## 2014-03-07 ENCOUNTER — Telehealth: Payer: Self-pay | Admitting: Internal Medicine

## 2014-03-07 NOTE — Telephone Encounter (Signed)
LMOM---? What kind of surgery?

## 2014-03-07 NOTE — Telephone Encounter (Signed)
New message      Pt is having surgery tomorrow.  Will he need to be interrogated?

## 2014-03-08 NOTE — Telephone Encounter (Signed)
Follow up      Pt is having eye surgery and need to know if pacemaker needs to be interrogated.  She is faxing info over to [place].

## 2014-03-08 NOTE — Telephone Encounter (Signed)
Meredith to send paperwork to be filled out and faxed back/kwm

## 2014-04-25 ENCOUNTER — Ambulatory Visit (INDEPENDENT_AMBULATORY_CARE_PROVIDER_SITE_OTHER): Payer: Medicare Other | Admitting: Family Medicine

## 2014-04-25 ENCOUNTER — Encounter: Payer: Self-pay | Admitting: Family Medicine

## 2014-04-25 VITALS — BP 136/64 | HR 67 | Wt 141.0 lb

## 2014-04-25 DIAGNOSIS — I10 Essential (primary) hypertension: Secondary | ICD-10-CM

## 2014-04-25 NOTE — Progress Notes (Signed)
   Subjective:    Patient ID: Donald Patel, male    DOB: October 03, 1940, 73 y.o.   MRN: [id number]  HPI hypertension. He brought in his home blood pressure numbers. They are mostly running from 494 496 systolic. And primarily in the 75F to 16B diastolic. Pulse typically remains in the 60s. His average blood pressure was 136/80 with a pulse of 68.   Review of Systems     Objective:   Physical Exam  Constitutional: He is oriented to person, place, and time. He appears well-developed and well-nourished.  HENT:  Head: Normocephalic and atraumatic.  Cardiovascular: Normal rate, regular rhythm and normal heart sounds.   Pulmonary/Chest: Effort normal and breath sounds normal.  Neurological: He is alert and oriented to person, place, and time.  Skin: Skin is warm and dry.  Psychiatric: He has a normal mood and affect. His behavior is normal.          Assessment & Plan:  HTN- well controlled.   Check BMP.  F/U in 6 months. Home BPs at goal. He is going to compare his blood pressure cuff to his sisters and I also encouraged him to bring in with him at his next office visit if he would like. Discussed with him the goal is to keep his systolic blood pressure under 140.

## 2014-04-26 LAB — BASIC METABOLIC PANEL WITH GFR
BUN: 11 mg/dL (ref 6–23)
CALCIUM: 9.4 mg/dL (ref 8.4–10.5)
CHLORIDE: 97 meq/L (ref 96–112)
CO2: 30 meq/L (ref 19–32)
CREATININE: 0.97 mg/dL (ref 0.50–1.35)
GFR, Est African American: 89 mL/min
GFR, Est Non African American: 77 mL/min
GLUCOSE: 88 mg/dL (ref 70–99)
Potassium: 3.8 mEq/L (ref 3.5–5.3)
Sodium: 139 mEq/L (ref 135–145)

## 2014-04-26 NOTE — Progress Notes (Signed)
Quick Note:  All labs are normal. ______ 

## 2014-05-16 ENCOUNTER — Ambulatory Visit (INDEPENDENT_AMBULATORY_CARE_PROVIDER_SITE_OTHER): Payer: Medicare Other | Admitting: *Deleted

## 2014-05-16 ENCOUNTER — Encounter: Payer: Self-pay | Admitting: Internal Medicine

## 2014-05-16 DIAGNOSIS — I442 Atrioventricular block, complete: Secondary | ICD-10-CM

## 2014-05-16 LAB — MDC_IDC_ENUM_SESS_TYPE_REMOTE
Battery Remaining Longevity: 115 mo
Battery Remaining Percentage: 95.5 %
Battery Voltage: 2.99 V
Brady Statistic AP VP Percent: 18 %
Brady Statistic AP VS Percent: 1 %
Brady Statistic AS VP Percent: 82 %
Brady Statistic AS VS Percent: 1 %
Brady Statistic RA Percent Paced: 18 %
Brady Statistic RV Percent Paced: 99 %
Date Time Interrogation Session: 20151207071454
Implantable Pulse Generator Model: 2240
Implantable Pulse Generator Serial Number: 7596156
Lead Channel Impedance Value: 430 Ohm
Lead Channel Impedance Value: 580 Ohm
Lead Channel Pacing Threshold Amplitude: 0.875 V
Lead Channel Pacing Threshold Pulse Width: 0.5 ms
Lead Channel Sensing Intrinsic Amplitude: 5 mV
Lead Channel Setting Pacing Amplitude: 1.125
Lead Channel Setting Pacing Amplitude: 2 V
Lead Channel Setting Pacing Pulse Width: 0.5 ms
Lead Channel Setting Sensing Sensitivity: 5 mV

## 2014-05-16 NOTE — Progress Notes (Signed)
Remote pacemaker transmission.   

## 2014-05-19 ENCOUNTER — Encounter (HOSPITAL_COMMUNITY): Payer: Self-pay | Admitting: Internal Medicine

## 2014-05-30 ENCOUNTER — Encounter: Payer: Self-pay | Admitting: Cardiology

## 2014-07-07 DIAGNOSIS — Z961 Presence of intraocular lens: Secondary | ICD-10-CM | POA: Diagnosis not present

## 2014-07-07 DIAGNOSIS — H35371 Puckering of macula, right eye: Secondary | ICD-10-CM | POA: Diagnosis not present

## 2014-07-07 DIAGNOSIS — H35351 Cystoid macular degeneration, right eye: Secondary | ICD-10-CM | POA: Diagnosis not present

## 2014-08-15 ENCOUNTER — Ambulatory Visit (INDEPENDENT_AMBULATORY_CARE_PROVIDER_SITE_OTHER): Payer: Medicare Other | Admitting: *Deleted

## 2014-08-15 DIAGNOSIS — H524 Presbyopia: Secondary | ICD-10-CM | POA: Diagnosis not present

## 2014-08-15 DIAGNOSIS — I442 Atrioventricular block, complete: Secondary | ICD-10-CM

## 2014-08-15 DIAGNOSIS — Z961 Presence of intraocular lens: Secondary | ICD-10-CM | POA: Diagnosis not present

## 2014-08-15 NOTE — Progress Notes (Signed)
Remote pacemaker transmission.   

## 2014-08-16 LAB — MDC_IDC_ENUM_SESS_TYPE_REMOTE
Battery Remaining Longevity: 113 mo
Battery Remaining Percentage: 94 %
Brady Statistic AP VP Percent: 19 %
Brady Statistic AP VS Percent: 1 %
Brady Statistic AS VS Percent: 1 %
Brady Statistic RV Percent Paced: 99 %
Date Time Interrogation Session: 20160307070016
Lead Channel Impedance Value: 440 Ohm
Lead Channel Pacing Threshold Amplitude: 0.5 V
Lead Channel Pacing Threshold Pulse Width: 0.5 ms
Lead Channel Sensing Intrinsic Amplitude: 5 mV
Lead Channel Setting Pacing Amplitude: 1.125
Lead Channel Setting Pacing Pulse Width: 0.5 ms
MDC IDC MSMT BATTERY VOLTAGE: 2.99 V
MDC IDC MSMT LEADCHNL RA PACING THRESHOLD PULSEWIDTH: 0.5 ms
MDC IDC MSMT LEADCHNL RV IMPEDANCE VALUE: 580 Ohm
MDC IDC MSMT LEADCHNL RV PACING THRESHOLD AMPLITUDE: 0.875 V
MDC IDC MSMT LEADCHNL RV SENSING INTR AMPL: 9.9 mV
MDC IDC PG SERIAL: 7596156
MDC IDC SET LEADCHNL RA PACING AMPLITUDE: 2 V
MDC IDC SET LEADCHNL RV SENSING SENSITIVITY: 5 mV
MDC IDC STAT BRADY AS VP PERCENT: 81 %
MDC IDC STAT BRADY RA PERCENT PACED: 18 %

## 2014-08-22 ENCOUNTER — Encounter: Payer: Self-pay | Admitting: Cardiology

## 2014-08-26 ENCOUNTER — Encounter: Payer: Self-pay | Admitting: Internal Medicine

## 2014-10-24 ENCOUNTER — Encounter: Payer: Self-pay | Admitting: Family Medicine

## 2014-10-24 ENCOUNTER — Ambulatory Visit (INDEPENDENT_AMBULATORY_CARE_PROVIDER_SITE_OTHER): Payer: Medicare Other | Admitting: Family Medicine

## 2014-10-24 VITALS — BP 140/82 | HR 73 | Wt 145.0 lb

## 2014-10-24 DIAGNOSIS — Z13220 Encounter for screening for lipoid disorders: Secondary | ICD-10-CM | POA: Diagnosis not present

## 2014-10-24 DIAGNOSIS — I10 Essential (primary) hypertension: Secondary | ICD-10-CM

## 2014-10-24 DIAGNOSIS — Z23 Encounter for immunization: Secondary | ICD-10-CM | POA: Diagnosis not present

## 2014-10-24 DIAGNOSIS — Z125 Encounter for screening for malignant neoplasm of prostate: Secondary | ICD-10-CM

## 2014-10-24 NOTE — Progress Notes (Signed)
   Subjective:    Patient ID: Donald Patel, male    DOB: 21-Aug-1940, 74 y.o.   MRN: [id number]  HPI Hypertension- Pt denies chest pain, SOB, dizziness, or heart palpitations.  Taking meds as directed w/o problems.  Denies medication side effects.  Home BP log shows great numbers. Only one out of 14 pressures was elevated. Due for labwork. He is active. He watches what he eats.    Review of Systems     Objective:   Physical Exam  Constitutional: He is oriented to person, place, and time. He appears well-developed and well-nourished.  HENT:  Head: Normocephalic and atraumatic.  Eyes: Conjunctivae are normal.  Neck: Neck supple. No thyromegaly present.  Cardiovascular: Normal rate, regular rhythm and normal heart sounds.   No carotid bruits.   Pulmonary/Chest: Effort normal and breath sounds normal.  Lymphadenopathy:    He has no cervical adenopathy.  Neurological: He is alert and oriented to person, place, and time.  Skin: Skin is warm and dry.  Psychiatric: He has a normal mood and affect. His behavior is normal.          Assessment & Plan:  HTN- well controlled.  Continue current regimen. Continue to keep eye on home blood pressures periodically. Follow-up in 6 months. Due for CMP and fasting lipid panel.  Due for screening PSA-PSA ordered today. Next  Discussed need for Prevnar vaccine. He said he wanted to think about it. VIS sheet provided.

## 2014-11-04 ENCOUNTER — Other Ambulatory Visit: Payer: Self-pay | Admitting: Internal Medicine

## 2014-11-10 DIAGNOSIS — Z125 Encounter for screening for malignant neoplasm of prostate: Secondary | ICD-10-CM | POA: Diagnosis not present

## 2014-11-10 DIAGNOSIS — I10 Essential (primary) hypertension: Secondary | ICD-10-CM | POA: Diagnosis not present

## 2014-11-10 DIAGNOSIS — Z13220 Encounter for screening for lipoid disorders: Secondary | ICD-10-CM | POA: Diagnosis not present

## 2014-11-11 LAB — LIPID PANEL
CHOLESTEROL: 151 mg/dL (ref 0–200)
HDL: 52 mg/dL (ref >=40)
LDL Cholesterol: 86 mg/dL (ref 0–99)
Total CHOL/HDL Ratio: 2.9 Ratio
Triglycerides: 65 mg/dL (ref <150)
VLDL: 13 mg/dL (ref 0–40)

## 2014-11-11 LAB — COMPLETE METABOLIC PANEL WITH GFR
ALBUMIN: 3.8 g/dL (ref 3.5–5.2)
ALK PHOS: 129 U/L — AB (ref 39–117)
ALT: 16 U/L (ref 0–53)
AST: 20 U/L (ref 0–37)
BILIRUBIN TOTAL: 1 mg/dL (ref 0.2–1.2)
BUN: 10 mg/dL (ref 6–23)
CO2: 34 meq/L — AB (ref 19–32)
Calcium: 9.2 mg/dL (ref 8.4–10.5)
Chloride: 99 mEq/L (ref 96–112)
Creat: 0.79 mg/dL (ref 0.50–1.35)
GFR, Est African American: >89 mL/min
GFR, Est Non African American: 88 mL/min
Glucose, Bld: 102 mg/dL — ABNORMAL HIGH (ref 70–99)
Potassium: 3.9 mEq/L (ref 3.5–5.3)
SODIUM: 136 meq/L (ref 135–145)
Total Protein: 7.2 g/dL (ref 6.0–8.3)

## 2014-11-11 LAB — PSA: PSA: 0.59 ng/mL (ref <=4.00)

## 2014-11-14 ENCOUNTER — Encounter: Payer: Self-pay | Admitting: Internal Medicine

## 2014-11-14 ENCOUNTER — Ambulatory Visit (INDEPENDENT_AMBULATORY_CARE_PROVIDER_SITE_OTHER): Payer: Medicare Other | Admitting: *Deleted

## 2014-11-14 DIAGNOSIS — I442 Atrioventricular block, complete: Secondary | ICD-10-CM

## 2014-11-15 NOTE — Progress Notes (Signed)
Remote pacemaker transmission.   

## 2014-11-17 LAB — CUP PACEART REMOTE DEVICE CHECK
Battery Remaining Percentage: 93 %
Battery Voltage: 2.99 V
Brady Statistic AP VP Percent: 19 %
Brady Statistic AS VP Percent: 81 %
Brady Statistic AS VS Percent: 1 %
Brady Statistic RA Percent Paced: 19 %
Brady Statistic RV Percent Paced: 99 %
Date Time Interrogation Session: 20160606060014
Lead Channel Impedance Value: 560 Ohm
Lead Channel Pacing Threshold Amplitude: 0.5 V
Lead Channel Pacing Threshold Amplitude: 0.75 V
Lead Channel Pacing Threshold Pulse Width: 0.5 ms
Lead Channel Sensing Intrinsic Amplitude: 9.9 mV
Lead Channel Setting Pacing Amplitude: 1 V
MDC IDC MSMT BATTERY REMAINING LONGEVITY: 111 mo
MDC IDC MSMT LEADCHNL RA IMPEDANCE VALUE: 430 Ohm
MDC IDC MSMT LEADCHNL RA SENSING INTR AMPL: 5 mV
MDC IDC MSMT LEADCHNL RV PACING THRESHOLD PULSEWIDTH: 0.5 ms
MDC IDC PG SERIAL: 7596156
MDC IDC SET LEADCHNL RA PACING AMPLITUDE: 2 V
MDC IDC SET LEADCHNL RV PACING PULSEWIDTH: 0.5 ms
MDC IDC SET LEADCHNL RV SENSING SENSITIVITY: 5 mV
MDC IDC STAT BRADY AP VS PERCENT: 1 %
Pulse Gen Model: 2240

## 2014-11-22 ENCOUNTER — Telehealth: Payer: Self-pay | Admitting: *Deleted

## 2014-11-22 DIAGNOSIS — R748 Abnormal levels of other serum enzymes: Secondary | ICD-10-CM

## 2014-11-22 NOTE — Telephone Encounter (Signed)
CMP ordered to recheck alkaline phosphatase.

## 2014-11-23 LAB — COMPLETE METABOLIC PANEL WITH GFR
ALT: 11 U/L (ref 0–53)
AST: 17 U/L (ref 0–37)
Albumin: 4.2 g/dL (ref 3.5–5.2)
Alkaline Phosphatase: 85 U/L (ref 39–117)
BUN: 15 mg/dL (ref 6–23)
CALCIUM: 9.7 mg/dL (ref 8.4–10.5)
CO2: 29 meq/L (ref 19–32)
CREATININE: 0.91 mg/dL (ref 0.50–1.35)
Chloride: 98 mEq/L (ref 96–112)
GFR, Est Non African American: 83 mL/min
GLUCOSE: 98 mg/dL (ref 70–99)
Potassium: 3.8 mEq/L (ref 3.5–5.3)
Sodium: 137 mEq/L (ref 135–145)
TOTAL PROTEIN: 7.5 g/dL (ref 6.0–8.3)
Total Bilirubin: 0.9 mg/dL (ref 0.2–1.2)

## 2014-11-25 NOTE — Telephone Encounter (Signed)
Quick Note:  All labs are normal. ______ 

## 2014-11-29 ENCOUNTER — Encounter: Payer: Self-pay | Admitting: Cardiology

## 2014-12-02 ENCOUNTER — Other Ambulatory Visit: Payer: Self-pay

## 2014-12-02 MED ORDER — HYDROCHLOROTHIAZIDE 25 MG PO TABS: 25.0000 mg | ORAL_TABLET | Freq: Every day | ORAL | Status: DC

## 2015-01-12 DIAGNOSIS — H35371 Puckering of macula, right eye: Secondary | ICD-10-CM | POA: Diagnosis not present

## 2015-02-15 ENCOUNTER — Ambulatory Visit (INDEPENDENT_AMBULATORY_CARE_PROVIDER_SITE_OTHER): Payer: Medicare Other | Admitting: Family Medicine

## 2015-02-15 VITALS — Temp 97.7°F

## 2015-02-15 DIAGNOSIS — Z23 Encounter for immunization: Secondary | ICD-10-CM

## 2015-02-15 NOTE — Progress Notes (Signed)
   Subjective:    Patient ID: Donald Patel, male    DOB: 06-May-1941, 74 y.o.   MRN: [id number]  HPI  Donald Patel is here for Prevnar 13 injection.   Review of Systems     Objective:   Physical Exam        Assessment & Plan:  Patient tolerated injection well without complications.

## 2015-02-24 ENCOUNTER — Encounter: Payer: Self-pay | Admitting: Internal Medicine

## 2015-02-24 ENCOUNTER — Ambulatory Visit (INDEPENDENT_AMBULATORY_CARE_PROVIDER_SITE_OTHER): Payer: Medicare Other | Admitting: Internal Medicine

## 2015-02-24 VITALS — BP 148/64 | HR 65 | Ht 70.5 in | Wt 145.0 lb

## 2015-02-24 DIAGNOSIS — Z950 Presence of cardiac pacemaker: Secondary | ICD-10-CM | POA: Diagnosis not present

## 2015-02-24 DIAGNOSIS — I442 Atrioventricular block, complete: Secondary | ICD-10-CM

## 2015-02-24 DIAGNOSIS — I10 Essential (primary) hypertension: Secondary | ICD-10-CM | POA: Diagnosis not present

## 2015-02-24 LAB — CUP PACEART INCLINIC DEVICE CHECK
Battery Voltage: 2.99 V
Brady Statistic RA Percent Paced: 20 %
Brady Statistic RV Percent Paced: 99.83 %
Lead Channel Impedance Value: 550 Ohm
Lead Channel Pacing Threshold Amplitude: 0.5 V
Lead Channel Pacing Threshold Amplitude: 1.25 V
Lead Channel Pacing Threshold Pulse Width: 0.5 ms
MDC IDC MSMT BATTERY REMAINING LONGEVITY: 120 mo
MDC IDC MSMT LEADCHNL RA IMPEDANCE VALUE: 462.5 Ohm
MDC IDC MSMT LEADCHNL RA PACING THRESHOLD AMPLITUDE: 0.5 V
MDC IDC MSMT LEADCHNL RA PACING THRESHOLD PULSEWIDTH: 0.5 ms
MDC IDC MSMT LEADCHNL RA SENSING INTR AMPL: 5 mV
MDC IDC MSMT LEADCHNL RV PACING THRESHOLD AMPLITUDE: 1.25 V
MDC IDC MSMT LEADCHNL RV PACING THRESHOLD PULSEWIDTH: 0.5 ms
MDC IDC MSMT LEADCHNL RV PACING THRESHOLD PULSEWIDTH: 0.5 ms
MDC IDC PG SERIAL: 7596156
MDC IDC SESS DTM: 20160916105304
MDC IDC SET LEADCHNL RA PACING AMPLITUDE: 2 V
MDC IDC SET LEADCHNL RV PACING AMPLITUDE: 1.375
MDC IDC SET LEADCHNL RV PACING PULSEWIDTH: 0.5 ms
MDC IDC SET LEADCHNL RV SENSING SENSITIVITY: 5 mV
Pulse Gen Model: 2240

## 2015-02-24 MED ORDER — HYDROCHLOROTHIAZIDE 25 MG PO TABS: 25.0000 mg | ORAL_TABLET | Freq: Every day | ORAL | Status: DC

## 2015-02-24 NOTE — Patient Instructions (Signed)
Medication Instructions:  Your physician recommends that you continue on your current medications as directed. Please refer to the Current Medication list given to you today.   Labwork: None ordered  Testing/Procedures: None ordered  Follow-Up: Your physician wants you to follow-up in: 12 months with Dr Allred You will receive a reminder letter in the mail two months in advance. If you don't receive a letter, please call our office to schedule the follow-up appointment.  Remote monitoring is used to monitor your Pacemaker  from home. This monitoring reduces the number of office visits required to check your device to one time per year. It allows us to keep an eye on the functioning of your device to ensure it is working properly. You are scheduled for a device check from home on 05/29/15. You may send your transmission at any time that day. If you have a wireless device, the transmission will be sent automatically. After your physician reviews your transmission, you will receive a postcard with your next transmission date.    Any Other Special Instructions Will Be Listed Below (If Applicable).   

## 2015-02-25 NOTE — Progress Notes (Signed)
PCP: Beatrice Lecher, MD   Donald Patel is a 74 y.o. male who presents today for routine electrophysiology followup.  Since his last vsiit, the patient reports doing very well.   Today, he denies symptoms of palpitations, chest pain, shortness of breath,  lower extremity edema, dizziness, presyncope, or syncope.  The patient is otherwise without complaint today.   Past Medical History  Diagnosis Date  . AV BLOCK, COMPLETE     s/p PPM (SJM)  . ERECTILE DYSFUNCTION, ORGANIC   . Hypertension    Past Surgical History  Procedure Laterality Date  . Knee surgery    . Vasectomy    . Pacemaker placement  12/22/96    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, most recent generator (SJM) 05/02/04 both implanted by Dr Rebecka Apley [facility]  . Cataract extraction w/ intraocular lens implant  2013    right eye  . Cataract extraction w/ intraocular lens implant  2015    Left eye  . Pacemaker generator change  07/29/13    MDT pacemaker generator change by Dr Rayann Heman  . Permanent pacemaker generator change N/A 07/29/2013    Procedure: PERMANENT PACEMAKER GENERATOR CHANGE;  Surgeon: Coralyn Mark, MD;  Location: [facility];  Service: Cardiovascular;  Laterality: N/A;    Current Outpatient Prescriptions  Medication Sig Dispense Refill  . hydrochlorothiazide (HYDRODIURIL) 25 MG tablet Take 1 tablet (25 mg total) by mouth daily. 90 tablet 3   No current facility-administered medications for this visit.    Physical Exam: Filed Vitals:   02/24/15 0941  BP: 148/64  Pulse: 65  Height: 5' 10.5" (1.791 m)  Weight: 145 lb (65.772 kg)    GEN- The patient is well appearing, alert and oriented x 3 today.   Head- normocephalic, atraumatic Eyes-  Sclera clear, conjunctiva pink Ears- hearing intact Oropharynx- clear Lungs- Clear to ausculation bilaterally, normal work of breathing Chest- pacemaker pocket is well healed Heart- Regular rate and rhythm, no murmurs, rubs or gallops, PMI not laterally displaced GI- soft,  NT, ND, + BS Extremities- no clubbing, cyanosis, or edema  Pacemaker interrogation- reviewed in detail today,  See PACEART report  Assessment and Plan:  1. Complete heart block Normal pacemaker function See Pace Art report No changes today  2. htn Stable No change required today  Merlin Return to see me in 1 year

## 2015-05-12 ENCOUNTER — Encounter: Payer: Self-pay | Admitting: Family Medicine

## 2015-05-12 ENCOUNTER — Ambulatory Visit (INDEPENDENT_AMBULATORY_CARE_PROVIDER_SITE_OTHER): Payer: Medicare Other | Admitting: Family Medicine

## 2015-05-12 VITALS — BP 154/53 | HR 63 | Temp 97.7°F | Resp 16 | Wt 143.7 lb

## 2015-05-12 DIAGNOSIS — Z0000 Encounter for general adult medical examination without abnormal findings: Secondary | ICD-10-CM | POA: Diagnosis not present

## 2015-05-12 LAB — COMPLETE METABOLIC PANEL WITH GFR
ALBUMIN: 4.4 g/dL (ref 3.6–5.1)
ALK PHOS: 79 U/L (ref 40–115)
ALT: 14 U/L (ref 9–46)
AST: 20 U/L (ref 10–35)
BUN: 12 mg/dL (ref 7–25)
CALCIUM: 9.3 mg/dL (ref 8.6–10.3)
CO2: 27 mmol/L (ref 20–31)
CREATININE: 0.84 mg/dL (ref 0.70–1.18)
Chloride: 99 mmol/L (ref 98–110)
GFR, Est African American: >89 mL/min (ref >=60)
GFR, Est Non African American: 86 mL/min (ref >=60)
Glucose, Bld: 98 mg/dL (ref 65–99)
Potassium: 3.9 mmol/L (ref 3.5–5.3)
Sodium: 138 mmol/L (ref 135–146)
TOTAL PROTEIN: 7.4 g/dL (ref 6.1–8.1)
Total Bilirubin: 1.1 mg/dL (ref 0.2–1.2)

## 2015-05-12 MED ORDER — AMBULATORY NON FORMULARY MEDICATION: Status: DC

## 2015-05-12 NOTE — Patient Instructions (Signed)
Keep up a regular exercise program and make sure you are eating a healthy diet Try to eat 4 servings of dairy a day, or if you are lactose intolerant take a calcium with vitamin D daily.  Your vaccines are up to date.   

## 2015-05-12 NOTE — Progress Notes (Signed)
Subjective:    Donald Patel is a 74 y.o. male who presents for Medicare Annual/Subsequent preventive examination.   Preventive Screening-Counseling & Management  Tobacco History  Smoking status  . Former Smoker  . Quit date: 06/10/1985  Smokeless tobacco  . Not on file    Problems Prior to Visit 1.  Hypertension- Pt denies chest pain, SOB, dizziness, or heart palpitations.  Taking meds as directed w/o problems.  Denies medication side effects.  He reports home BPs have been well controlled.      Current Problems (verified) Patient Active Problem List   Diagnosis Date Noted  . 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 AV BLOCK, COMPLETE 07/20/2008  . Essential hypertension, benign 02/25/2008  . 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    Medications Prior to Visit Current Outpatient Prescriptions on File Prior to Visit  Medication Sig Dispense Refill  . hydrochlorothiazide (HYDRODIURIL) 25 MG tablet Take 1 tablet (25 mg total) by mouth daily. 90 tablet 3   No current facility-administered medications on file prior to visit.    Current Medications (verified) Current Outpatient Prescriptions  Medication Sig Dispense Refill  . hydrochlorothiazide (HYDRODIURIL) 25 MG tablet Take 1 tablet (25 mg total) by mouth daily. 90 tablet 3  . AMBULATORY NON FORMULARY MEDICATION Medication Name: Influenzium - 52m once a month during flu season. By Alric Quan. 1 Units 0   No current facility-administered medications for this visit.     Allergies (verified) Review of patient's allergies indicates no known allergies.   PAST HISTORY  Family History Family History  Problem Relation Age of Onset  . Heart attack Brother   . Diabetes    . Hodgkin's lymphoma Brother     Social History Social History  Substance Use Topics  . Smoking status: Former Smoker    Quit date: 06/10/1985  . Smokeless tobacco: Not on file  . Alcohol Use: No    Are there smokers in your home (other  than you)?  No  Risk Factors Current exercise habits: The patient does not participate in regular exercise at present.  Dietary issues discussed: None   Cardiac risk factors: advanced age (older than 27 for men, 61 for women) and hypertension.  Depression Screen (Note: if answer to either of the following is "Yes", a more complete depression screening is indicated)   Q1: Over the past two weeks, have you felt down, depressed or hopeless? No  Q2: Over the past two weeks, have you felt little interest or pleasure in doing things? No  Have you lost interest or pleasure in daily life? No  Do you often feel hopeless? No  Do you cry easily over simple problems? No  Activities of Daily Living In your present state of health, do you have any difficulty performing the following activities?:  Driving? No Managing money?  No Feeding yourself? No Getting from bed to chair? No Climbing a flight of stairs? No Preparing food and eating?: No Bathing or showering? No Getting dressed: No Getting to the toilet? No Using the toilet:No Moving around from place to place: No In the past year have you fallen or had a near fall?:No  Hearing Difficulties: No Do you often ask people to speak up or repeat themselves? No Do you experience ringing or noises in your ears? Yes Do you have difficulty understanding soft or whispered voices? No   Do you feel that you have a problem with memory? No  Do you often misplace items? No  Do you feel safe at  home?  Yes  Cognitive Testing  Alert? Yes  Normal Appearance?Yes  Oriented to person? Yes  Place? Yes   Time? Yes  Recall of three objects?  Yes  Can perform simple calculations? Yes  Displays appropriate judgment?Yes  Can read the correct time from a watch face?Yes   Advanced Directives have been discussed with the patient? Yes   List the Names of Other Physician/Practitioners you currently use: 1.  Dr. Larose Kells.   Indicate any recent Medical  Services you may have received from other than [facility] providers in the past year (date may be approximate).  Immunization History  Administered Date(s) Administered  . Influenza Whole 08/12/1997  . Pneumococcal Conjugate-13 02/15/2015  . Pneumococcal Polysaccharide-23 05/19/2009  . Td 02/25/2008  . Zoster 05/25/2010    Screening Tests Health Maintenance  Topic Date Due  . INFLUENZA VACCINE  05/11/2016 (Originally 01/09/2015)  . COLONOSCOPY  08/08/2015  . TETANUS/TDAP  02/24/2018  . ZOSTAVAX  Addressed  . PNA vac Low Risk Adult  Completed    All answers were reviewed with the patient and necessary referrals were made:  METHENEY,CATHERINE, MD   05/12/2015   History reviewed: allergies, current medications, past family history, past medical history, past social history, past surgical history and problem list  Review of Systems Pertinent items noted in HPI and remainder of comprehensive ROS otherwise negative.    Objective:     Vision by Snellen chart: eye exam is UTD Blood pressure 154/53, pulse 63, temperature 97.7 F (36.5 C), temperature source Oral, resp. rate 16, weight 143 lb 11.2 oz (65.182 kg), SpO2 99 %. Body mass index is 20.32 kg/(m^2).  BP 154/53 mmHg  Pulse 63  Temp(Src) 97.7 F (36.5 C) (Oral)  Resp 16  Wt 143 lb 11.2 oz (65.182 kg)  SpO2 99% General appearance: alert, cooperative and appears stated age Head: Normocephalic, without obvious abnormality, atraumatic Eyes: conj clear, EOMI, PEERL Ears: normal TM's and external ear canals both ears Nose: Nares normal. Septum midline. Mucosa normal. No drainage or sinus tenderness. Throat: lips, mucosa, and tongue normal; teeth and gums normal Neck: no adenopathy, no carotid bruit, no JVD, supple, symmetrical, trachea midline and thyroid not enlarged, symmetric, no tenderness/mass/nodules Back: symmetric, no curvature. ROM normal. No CVA tenderness. Lungs: clear to auscultation bilaterally Chest wall: no  tenderness Heart: regular rate and rhythm, S1, S2 normal, no murmur, click, rub or gallop Abdomen: soft, non-tender; bowel sounds normal; no masses,  no organomegaly Extremities: extremities normal, atraumatic, no cyanosis or edema Pulses: 2+ and symmetric Skin: Skin color, texture, turgor normal. No rashes or lesions Lymph nodes: Cervical, supraclavicular, and axillary nodes normal. Neurologic: Alert and oriented X 3, normal strength and tone. Normal symmetric reflexes. Normal coordination and gait     Assessment:     Medicare annual Exam      Plan:     During the course of the visit the patient was educated and counseled about appropriate screening and preventive services including:    UTD on vaccines and screening   HTN - well controlled at home.  F/U in 6 months.    Will call to get copy eye report.    Diet review for nutrition referral? Yes ____  Not Indicated __X__   Patient Instructions (the written plan) was given to the patient.  Medicare Attestation I have personally reviewed: The patient's medical and social history Their use of alcohol, tobacco or illicit drugs Their current medications and supplements The patient's functional ability including ADLs,fall  risks, home safety risks, cognitive, and hearing and visual impairment Diet and physical activities Evidence for depression or mood disorders  The patient's weight, height, BMI, and visual acuity have been recorded in the chart.  I have made referrals, counseling, and provided education to the patient based on review of the above and I have provided the patient with a written personalized care plan for preventive services.     METHENEY,CATHERINE, MD   05/12/2015

## 2015-05-14 NOTE — Progress Notes (Signed)
Quick Note:  All labs are normal. ______ 

## 2015-05-29 ENCOUNTER — Ambulatory Visit (INDEPENDENT_AMBULATORY_CARE_PROVIDER_SITE_OTHER): Payer: Medicare Other | Admitting: *Deleted

## 2015-05-29 ENCOUNTER — Telehealth: Payer: Self-pay | Admitting: Cardiology

## 2015-05-29 DIAGNOSIS — I442 Atrioventricular block, complete: Secondary | ICD-10-CM | POA: Diagnosis not present

## 2015-05-29 NOTE — Telephone Encounter (Signed)
Confirmed remote transmission w/ pt wife.   

## 2015-05-29 NOTE — Progress Notes (Signed)
Remote pacemaker transmission.   

## 2015-06-01 LAB — CUP PACEART REMOTE DEVICE CHECK
Brady Statistic AP VP Percent: 27 %
Brady Statistic AP VS Percent: 1 %
Brady Statistic AS VP Percent: 73 %
Brady Statistic AS VS Percent: 1 %
Brady Statistic RV Percent Paced: 99 %
Implantable Lead Implant Date: 20051123
Implantable Lead Implant Date: 20051123
Lead Channel Pacing Threshold Amplitude: 1 V
Lead Channel Pacing Threshold Pulse Width: 0.5 ms
Lead Channel Sensing Intrinsic Amplitude: 5 mV
Lead Channel Setting Sensing Sensitivity: 5 mV
MDC IDC LEAD LOCATION: 753859
MDC IDC LEAD LOCATION: 753860
MDC IDC MSMT BATTERY REMAINING LONGEVITY: 115 mo
MDC IDC MSMT BATTERY REMAINING PERCENTAGE: 95.5 %
MDC IDC MSMT BATTERY VOLTAGE: 2.99 V
MDC IDC MSMT LEADCHNL RA IMPEDANCE VALUE: 450 Ohm
MDC IDC MSMT LEADCHNL RV IMPEDANCE VALUE: 480 Ohm
MDC IDC PG SERIAL: 7596156
MDC IDC SESS DTM: 20161219170023
MDC IDC SET LEADCHNL RA PACING AMPLITUDE: 2 V
MDC IDC SET LEADCHNL RV PACING AMPLITUDE: 1.25 V
MDC IDC SET LEADCHNL RV PACING PULSEWIDTH: 0.5 ms
MDC IDC STAT BRADY RA PERCENT PACED: 27 %

## 2015-06-02 ENCOUNTER — Encounter: Payer: Self-pay | Admitting: Cardiology

## 2015-08-28 ENCOUNTER — Ambulatory Visit (INDEPENDENT_AMBULATORY_CARE_PROVIDER_SITE_OTHER): Payer: Medicare Other | Admitting: *Deleted

## 2015-08-28 DIAGNOSIS — I442 Atrioventricular block, complete: Secondary | ICD-10-CM

## 2015-08-29 NOTE — Progress Notes (Signed)
Remote pacemaker transmission.   

## 2015-10-03 LAB — CUP PACEART REMOTE DEVICE CHECK
Battery Remaining Percentage: 95.5 %
Battery Voltage: 2.98 V
Brady Statistic AP VP Percent: 25 %
Brady Statistic RA Percent Paced: 25 %
Implantable Lead Implant Date: 20051123
Implantable Lead Location: 753859
Lead Channel Impedance Value: 450 Ohm
Lead Channel Sensing Intrinsic Amplitude: 5 mV
Lead Channel Setting Pacing Amplitude: 1.25 V
Lead Channel Setting Pacing Amplitude: 2 V
MDC IDC LEAD IMPLANT DT: 20051123
MDC IDC LEAD LOCATION: 753860
MDC IDC MSMT BATTERY REMAINING LONGEVITY: 115 mo
MDC IDC MSMT LEADCHNL RV IMPEDANCE VALUE: 490 Ohm
MDC IDC MSMT LEADCHNL RV PACING THRESHOLD AMPLITUDE: 1 V
MDC IDC MSMT LEADCHNL RV PACING THRESHOLD PULSEWIDTH: 0.5 ms
MDC IDC PG SERIAL: 7596156
MDC IDC SESS DTM: 20170320060014
MDC IDC SET LEADCHNL RV PACING PULSEWIDTH: 0.5 ms
MDC IDC SET LEADCHNL RV SENSING SENSITIVITY: 5 mV
MDC IDC STAT BRADY AP VS PERCENT: 1 %
MDC IDC STAT BRADY AS VP PERCENT: 75 %
MDC IDC STAT BRADY AS VS PERCENT: 1 %
MDC IDC STAT BRADY RV PERCENT PACED: 99 %

## 2015-10-06 ENCOUNTER — Encounter: Payer: Self-pay | Admitting: Cardiology

## 2015-10-09 DIAGNOSIS — H59031 Cystoid macular edema following cataract surgery, right eye: Secondary | ICD-10-CM | POA: Diagnosis not present

## 2015-11-10 ENCOUNTER — Encounter: Payer: Self-pay | Admitting: Family Medicine

## 2015-11-10 ENCOUNTER — Ambulatory Visit (INDEPENDENT_AMBULATORY_CARE_PROVIDER_SITE_OTHER): Payer: Medicare Other | Admitting: Family Medicine

## 2015-11-10 VITALS — BP 122/60 | HR 72 | Wt 148.0 lb

## 2015-11-10 DIAGNOSIS — I10 Essential (primary) hypertension: Secondary | ICD-10-CM | POA: Diagnosis not present

## 2015-11-10 DIAGNOSIS — Z125 Encounter for screening for malignant neoplasm of prostate: Secondary | ICD-10-CM

## 2015-11-10 NOTE — Progress Notes (Signed)
Subjective:    Patient ID: Donald Patel, male    DOB: [birth year], 75 y.o.   MRN: [id number]  HPI Hypertension- Pt denies chest pain, SOB, dizziness, or heart palpitations.  Taking meds as directed w/o problems.  Denies medication side effects.   He did bring in a home blood pressure log today. Most of his blood pressures look fantastic. He did have a couple that were in the 140s over 14s. He says those were right after drinking 2 cups of coffee. He said he didn't realize that caffeine can actually raise blood pressure and so started checking his blood pressures a little later in the day. He said he only drinks 2 cups in the morning and that's it. He denies any side effects of Donald Patel chlorothiazide.  He did want to let me know that he had the left cataract repaired about a year ago with [facility] eye surgeons. In fact he just had his recent eye checkup with Donald Patel at my eye doctor last month.   Review of Systems   BP 142/65 mmHg  Pulse 72  Wt 148 lb (67.132 kg)  SpO2 97%    No Known Allergies  Past Medical History  Diagnosis Date  . AV BLOCK, COMPLETE     s/p PPM (SJM)  . ERECTILE DYSFUNCTION, ORGANIC   . Hypertension     Past Surgical History  Procedure Laterality Date  . Knee surgery    . Vasectomy    . Pacemaker placement  12/22/96    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, most recent generator (SJM) 05/02/04 both implanted by Dr Rebecka Apley [facility]  . Cataract extraction w/ intraocular lens implant  2013    right eye - [facility]  . Cataract extraction w/ intraocular lens implant  2015    Left eye - [facility]  . Pacemaker generator change  07/29/13    MDT pacemaker generator change by Dr Rayann Heman  . Permanent pacemaker generator change N/A 07/29/2013    Procedure: PERMANENT PACEMAKER GENERATOR CHANGE;  Surgeon: Coralyn Mark, MD;  Location: [facility];  Service: Cardiovascular;  Laterality: N/A;    Social History   Social History  . Marital Status: Married   Spouse Name: N/A  . Number of Children: N/A  . Years of Education: N/A   Occupational History  . Retired    Social History Main Topics  . Smoking status: Former Smoker    Quit date: 06/10/1985  . Smokeless tobacco: Not on file  . Alcohol Use: No  . Drug Use: No  . Sexual Activity: Not on file   Other Topics Concern  . Not on file   Social History Narrative   Recently retired,Former Furniture conservator/restorer.  Exercise.     Family History  Problem Relation Age of Onset  . Heart attack Brother   . Diabetes    . Hodgkin's lymphoma Brother     Outpatient Encounter Prescriptions as of 11/10/2015  Medication Sig  . hydrochlorothiazide (HYDRODIURIL) 25 MG tablet Take 1 tablet (25 mg total) by mouth daily.  . [DISCONTINUED] AMBULATORY NON FORMULARY MEDICATION Medication Name: Influenzium - 55m once a month during flu season. By Alric Quan.   No facility-administered encounter medications on file as of 11/10/2015.           Objective:   Physical Exam  Constitutional: He is oriented to person, place, and time. He appears well-developed and well-nourished.  HENT:  Head: Normocephalic and atraumatic.  Cardiovascular: Normal rate, regular rhythm and  normal heart sounds.   Pulmonary/Chest: Effort normal and breath sounds normal.  Neurological: He is alert and oriented to person, place, and time.  Skin: Skin is warm and dry.  Psychiatric: He has a normal mood and affect. His behavior is normal.          Assessment & Plan:  HTN - Most home blood pressures look fantastic. We did review that caffeine is a stimulant and can raise blood pressure to be very careful about that consumption. Due for lipid and CMP today. Did encourage him to get back into regular exercise. He is very physically active but does not exercise routinely.  Due for PSA screening.

## 2015-11-13 DIAGNOSIS — I10 Essential (primary) hypertension: Secondary | ICD-10-CM | POA: Diagnosis not present

## 2015-11-13 DIAGNOSIS — Z125 Encounter for screening for malignant neoplasm of prostate: Secondary | ICD-10-CM | POA: Diagnosis not present

## 2015-11-13 LAB — BASIC METABOLIC PANEL
BUN: 17 mg/dL (ref 7–25)
CHLORIDE: 97 mmol/L — AB (ref 98–110)
CO2: 29 mmol/L (ref 20–31)
CREATININE: 0.8 mg/dL (ref 0.70–1.18)
Calcium: 9.5 mg/dL (ref 8.6–10.3)
Glucose, Bld: 107 mg/dL — ABNORMAL HIGH (ref 65–99)
POTASSIUM: 3.6 mmol/L (ref 3.5–5.3)
Sodium: 136 mmol/L (ref 135–146)

## 2015-11-13 LAB — LIPID PANEL
CHOL/HDL RATIO: 3.3 ratio (ref <=5.0)
CHOLESTEROL: 214 mg/dL — AB (ref 125–200)
HDL: 65 mg/dL (ref >=40)
LDL Cholesterol: 132 mg/dL — ABNORMAL HIGH (ref <130)
TRIGLYCERIDES: 86 mg/dL (ref <150)
VLDL: 17 mg/dL (ref <30)

## 2015-11-14 LAB — PSA: PSA: 0.62 ng/mL (ref <=4.00)

## 2015-11-27 ENCOUNTER — Ambulatory Visit (INDEPENDENT_AMBULATORY_CARE_PROVIDER_SITE_OTHER): Payer: Medicare Other | Admitting: *Deleted

## 2015-11-27 DIAGNOSIS — I442 Atrioventricular block, complete: Secondary | ICD-10-CM

## 2015-11-27 NOTE — Progress Notes (Signed)
Remote pacemaker transmission.   

## 2015-11-28 LAB — CUP PACEART REMOTE DEVICE CHECK
Brady Statistic AP VS Percent: 1 %
Brady Statistic AS VP Percent: 74 %
Brady Statistic AS VS Percent: 1 %
Brady Statistic RA Percent Paced: 25 %
Brady Statistic RV Percent Paced: 99 %
Date Time Interrogation Session: 20170619060014
Implantable Lead Implant Date: 20051123
Implantable Lead Location: 753860
Lead Channel Pacing Threshold Amplitude: 1.125 V
Lead Channel Pacing Threshold Pulse Width: 0.5 ms
Lead Channel Sensing Intrinsic Amplitude: 12 mV
Lead Channel Sensing Intrinsic Amplitude: 5 mV
Lead Channel Setting Pacing Amplitude: 1.375
Lead Channel Setting Pacing Amplitude: 2 V
Lead Channel Setting Pacing Pulse Width: 0.5 ms
Lead Channel Setting Sensing Sensitivity: 5 mV
MDC IDC LEAD IMPLANT DT: 20051123
MDC IDC LEAD LOCATION: 753859
MDC IDC MSMT BATTERY REMAINING LONGEVITY: 115 mo
MDC IDC MSMT BATTERY REMAINING PERCENTAGE: 95.5 %
MDC IDC MSMT BATTERY VOLTAGE: 2.98 V
MDC IDC MSMT LEADCHNL RA IMPEDANCE VALUE: 450 Ohm
MDC IDC MSMT LEADCHNL RV IMPEDANCE VALUE: 530 Ohm
MDC IDC PG SERIAL: 7596156
MDC IDC STAT BRADY AP VP PERCENT: 26 %

## 2015-11-29 ENCOUNTER — Encounter: Payer: Self-pay | Admitting: Family Medicine

## 2015-11-29 DIAGNOSIS — Z86010 Personal history of colonic polyps: Secondary | ICD-10-CM | POA: Diagnosis not present

## 2015-11-29 DIAGNOSIS — D122 Benign neoplasm of ascending colon: Secondary | ICD-10-CM | POA: Diagnosis not present

## 2015-11-29 HISTORY — PX: COLONOSCOPY: SHX174

## 2015-12-01 ENCOUNTER — Encounter: Payer: Self-pay | Admitting: Cardiology

## 2016-02-19 ENCOUNTER — Ambulatory Visit (INDEPENDENT_AMBULATORY_CARE_PROVIDER_SITE_OTHER): Payer: Medicare Other | Admitting: Internal Medicine

## 2016-02-19 ENCOUNTER — Encounter: Payer: Self-pay | Admitting: Internal Medicine

## 2016-02-19 VITALS — BP 146/82 | HR 60 | Ht 70.5 in | Wt 152.4 lb

## 2016-02-19 DIAGNOSIS — I442 Atrioventricular block, complete: Secondary | ICD-10-CM

## 2016-02-19 DIAGNOSIS — Z950 Presence of cardiac pacemaker: Secondary | ICD-10-CM | POA: Diagnosis not present

## 2016-02-19 DIAGNOSIS — I10 Essential (primary) hypertension: Secondary | ICD-10-CM

## 2016-02-19 MED ORDER — HYDROCHLOROTHIAZIDE 25 MG PO TABS: 25.0000 mg | ORAL_TABLET | Freq: Every day | ORAL | 3 refills | Status: DC

## 2016-02-19 NOTE — Progress Notes (Signed)
PCP: Beatrice Lecher, MD   Donald Patel is a 75 y.o. male who presents today for routine electrophysiology followup.  Since his last vsiit, the patient reports doing very well.  He remains active.  Today, he denies symptoms of palpitations, chest pain, shortness of breath,  lower extremity edema, dizziness, presyncope, or syncope.  The patient is otherwise without complaint today.   Past Medical History:  Diagnosis Date  . AV BLOCK, COMPLETE    s/p PPM (SJM)  . ERECTILE DYSFUNCTION, ORGANIC   . Hypertension    Past Surgical History:  Procedure Laterality Date  . CATARACT EXTRACTION W/ INTRAOCULAR LENS IMPLANT  2013   right eye - [facility]  . CATARACT EXTRACTION W/ INTRAOCULAR LENS IMPLANT  2015   Left eye - [facility]  . KNEE SURGERY    . PACEMAKER GENERATOR CHANGE  07/29/13   MDT pacemaker generator change by Dr Rayann Heman  . PACEMAKER PLACEMENT  12/22/96   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, most recent generator (SJM) 05/02/04 both implanted by Dr Rebecka Apley [facility]  . PERMANENT PACEMAKER GENERATOR CHANGE N/A 07/29/2013   Procedure: PERMANENT PACEMAKER GENERATOR CHANGE;  Surgeon: Coralyn Mark, MD;  Location: [facility]ATH [facility];  Service: Cardiovascular;  Laterality: N/A;  . VASECTOMY      Current Outpatient Prescriptions  Medication Sig Dispense Refill  . hydrochlorothiazide (HYDRODIURIL) 25 MG tablet Take 1 tablet (25 mg total) by mouth daily. 90 tablet 3  . ibuprofen (ADVIL,MOTRIN) 200 MG tablet Take 200 mg by mouth daily as needed (pain).    . naproxen sodium (ANAPROX) 220 MG tablet Take 220 mg by mouth 2 (two) times daily as needed (pain).     No current facility-administered medications for this visit.     Physical Exam: Vitals:   02/19/16 0916  BP: (!) 146/82  Pulse: 60  Weight: 152 lb 6.4 oz (69.1 kg)  Height: 5' 10.5" (1.791 m)    GEN- The patient is well appearing, alert and oriented x 3 today.   Head- normocephalic, atraumatic Eyes-  Sclera clear, conjunctiva  pink Ears- hearing intact Oropharynx- clear Lungs- Clear to ausculation bilaterally, normal work of breathing Chest- pacemaker pocket is well healed Heart- Regular rate and rhythm, no murmurs, rubs or gallops, PMI not laterally displaced GI- soft, NT, ND, + BS Extremities- no clubbing, cyanosis, or edema  Pacemaker interrogation- reviewed in detail today,  See PACEART report ekg today reveals AV pacing  Assessment and Plan:  1. Complete heart block Normal pacemaker function See Pace Art report No changes today I have discussed the available STJ firmware patch for cyberhacking with the patient today.  We have discussed the risks associated with the firmware upgrade as well as the theoretical risk for cyberhacking.  After an informed discussion, the patient has decided not to install the firmware patch at this time.   2. htn Stable No change required today  Merlin Return to see EP NP in 1 year  [name] MD, [facility] 02/19/2016 9:42 AM

## 2016-02-19 NOTE — Patient Instructions (Signed)
Medication Instructions:  Your physician recommends that you continue on your current medications as directed. Please refer to the Current Medication list given to you today.   Labwork: None ordered   Testing/Procedures: None ordered   Follow-Up: Your physician wants you to follow-up in: 12 months with [place] will receive a reminder letter in the mail two months in advance. If you don't receive a letter, please call our office to schedule the follow-up appointment.  Remote monitoring is used to monitor your Pacemaker from home. This monitoring reduces the number of office visits required to check your device to one time per year. It allows [place] to keep an eye on the functioning of your device to ensure it is working properly. You are scheduled for a device check from home on 05/20/16. You may send your transmission at any time that day. If you have a wireless device, the transmission will be sent automatically. After your physician reviews your transmission, you will receive a postcard with your next transmission date.     Any Other Special Instructions Will Be Listed Below (If Applicable).     If you need a refill on your cardiac medications before your next appointment, please call your pharmacy.

## 2016-05-07 ENCOUNTER — Ambulatory Visit (INDEPENDENT_AMBULATORY_CARE_PROVIDER_SITE_OTHER): Payer: Medicare Other | Admitting: Family Medicine

## 2016-05-07 ENCOUNTER — Telehealth: Payer: Self-pay | Admitting: Family Medicine

## 2016-05-07 ENCOUNTER — Encounter: Payer: Self-pay | Admitting: Family Medicine

## 2016-05-07 VITALS — BP 140/64 | HR 63 | Ht 71.0 in | Wt 151.0 lb

## 2016-05-07 DIAGNOSIS — R7309 Other abnormal glucose: Secondary | ICD-10-CM | POA: Diagnosis not present

## 2016-05-07 DIAGNOSIS — I10 Essential (primary) hypertension: Secondary | ICD-10-CM | POA: Diagnosis not present

## 2016-05-07 DIAGNOSIS — Z0000 Encounter for general adult medical examination without abnormal findings: Secondary | ICD-10-CM

## 2016-05-07 LAB — BASIC METABOLIC PANEL WITH GFR
BUN: 12 mg/dL (ref 7–25)
CHLORIDE: 97 mmol/L — AB (ref 98–110)
CO2: 29 mmol/L (ref 20–31)
Calcium: 9.6 mg/dL (ref 8.6–10.3)
Creat: 1.04 mg/dL (ref 0.70–1.18)
GFR, EST NON AFRICAN AMERICAN: 69 mL/min (ref >=60)
GFR, Est African American: 80 mL/min (ref >=60)
Glucose, Bld: 96 mg/dL (ref 65–99)
POTASSIUM: 3.6 mmol/L (ref 3.5–5.3)
SODIUM: 138 mmol/L (ref 135–146)

## 2016-05-07 LAB — POCT GLYCOSYLATED HEMOGLOBIN (HGB A1C): HEMOGLOBIN A1C: 5.2

## 2016-05-07 NOTE — Telephone Encounter (Signed)
Call pt: repeat BP was better today but still high.  Recommend add lsinopril to his HCTZ for better BP controll.  See what he thinks about that. Then recheck BP in 2 weeks with BMP.    Beatrice Lecher, MD

## 2016-05-07 NOTE — Telephone Encounter (Signed)
Called pt and he stated that doesn't want to add anything to his current regimen. He would like to speak to Dr. Rayann Heman about changing or adding anymore medications. He stated that when he was seen by Dr. Rayann Heman in September he gave him 3 months of his bp readings and he didn't see anything that would warrant him to change what he is currently doing. He stated that he has a f/u in 2 wks for his pacemaker check. Does he still need to come back in 2 wks for bp check?   Will fwd to pcp for advice.Maryruth Eve, [place]

## 2016-05-07 NOTE — Progress Notes (Addendum)
Subjective:   Donald Patel is a 75 y.o. male who presents for Medicare Annual/Subsequent preventive examination. He says he is physically active doing yard work but no regular exercise at this time. He says he really didn't go off over the summer because of the heat but plans to get back into it.  Review of Systems:  Comprehensive review of systems is negative.       Objective:    Vitals: BP 140/64 (BP Location: Left Arm, Cuff Size: Normal)   Pulse 63   Ht 5\' 11"  (1.803 m)   Wt 151 lb (68.5 kg)   SpO2 98%   BMI 21.06 kg/m   Body mass index is 21.06 kg/m.   Physical Exam  Constitutional: He is oriented to person, place, and time. He appears well-developed and well-nourished.  HENT:  Head: Normocephalic and atraumatic.  Right Ear: External ear normal.  Left Ear: External ear normal.  Nose: Nose normal.  Mouth/Throat: Oropharynx is clear and moist.  Eyes: Conjunctivae and EOM are normal. Pupils are equal, round, and reactive to light.  Neck: Normal range of motion. Neck supple. No thyromegaly present.  Cardiovascular: Normal rate, regular rhythm, normal heart sounds and intact distal pulses.   Pulmonary/Chest: Effort normal and breath sounds normal.  Abdominal: Soft. Bowel sounds are normal. He exhibits no distension and no mass. There is no tenderness. There is no rebound and no guarding.  Musculoskeletal: Normal range of motion.  Lymphadenopathy:    He has no cervical adenopathy.  Neurological: He is alert and oriented to person, place, and time. He has normal reflexes.  Skin: Skin is warm and dry.  Psychiatric: He has a normal mood and affect. His behavior is normal. Judgment and thought content normal.    Tobacco History  Smoking Status  . Former Smoker  . Packs/day: 1.50  . Years: 35.00  . Types: Cigarettes, Pipe, Cigars  . Quit date: 06/10/1985  Smokeless Tobacco  . Not on file     Counseling given: Not Answered   Past Medical History:  Diagnosis Date   . AV BLOCK, COMPLETE    s/p PPM (SJM)  . Cataract both eyes corrected  . ERECTILE DYSFUNCTION, ORGANIC   . Hypertension    Past Surgical History:  Procedure Laterality Date  . CATARACT EXTRACTION W/ INTRAOCULAR LENS IMPLANT  2013   right eye - [facility]  . CATARACT EXTRACTION W/ INTRAOCULAR LENS IMPLANT  2015   Left eye - [facility]  . EYE SURGERY  cataract both eyes  . KNEE SURGERY    . PACEMAKER GENERATOR CHANGE  07/29/13   MDT pacemaker generator change by Dr Rayann Heman  . PACEMAKER PLACEMENT  12/22/96   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, most recent generator (SJM) 05/02/04 both implanted by Dr Rebecka Aple[facility]  . PERMANENT PACEMAKER GENERATOR CHANGE N/A 07/29/2013   Procedure: PERMANENT PACEMAKER GENERATOR CHANGE;  Surgeon: Coralyn Mark, MD;  Location: [place] CATH [facility];  Service: Cardiovascular;  Laterality: N/A;  . VASECTOMY     Family History  Problem Relation Age of Onset  . Heart attack Brother   . Diabetes    . Hodgkin's lymphoma Brother    History  Sexual Activity  . Sexual activity: No    Outpatient Encounter Prescriptions as of 05/07/2016  Medication Sig  . hydrochlorothiazide (HYDRODIURIL) 25 MG tablet Take 1 tablet (25 mg total) by mouth daily.  Marland Kitchen ibuprofen (ADVIL,MOTRIN) 200 MG tablet Take 200 mg by mouth daily as needed (pain).  Marland Kitchen  naproxen sodium (ANAPROX) 220 MG tablet Take 220 mg by mouth 2 (two) times daily as needed (pain).   No facility-administered encounter medications on file as of 05/07/2016.     Activities of Daily Living In your present state of health, do you have any difficulty performing the following activities: 05/07/2016  Hearing? N  Vision? N  Difficulty concentrating or making decisions? N  Walking or climbing stairs? N  Dressing or bathing? N  Doing errands, shopping? N  Some recent data might be hidden    Patient Care Team: Hali Marry, MD as PCP - General [name], MD as Attending Physician (Cardiology) Leonia Corona,  MD as Referring Physician (Ophthalmology) Dr. Hilario Quarry (Optometry)   Assessment:     Exercise Activities and Dietary recommendations    Goals    None     Fall Risk Fall Risk  05/07/2016 11/10/2015 04/25/2014  Falls in the past year? No No No   Depression Screen PHQ 2/9 Scores 05/07/2016 11/10/2015 04/25/2014  PHQ - 2 Score 0 0 0    Cognitive Function     6CIT Screen 05/07/2016  What Year? 0 points  What month? 0 points  What time? 0 points  Count back from 20 0 points  Months in reverse 2 points  Repeat phrase 2 points  Total Score 4    Immunization History  Administered Date(s) Administered  . Influenza Whole 08/12/1997  . Pneumococcal Conjugate-13 02/15/2015  . Pneumococcal Polysaccharide-23 05/19/2009  . Td 02/25/2008  . Zoster 05/25/2010   Screening Tests Health Maintenance  Topic Date Due  . INFLUENZA VACCINE  09/08/2023 (Originally 01/09/2016)  . COLONOSCOPY  08/07/2017  . TETANUS/TDAP  02/24/2018  . ZOSTAVAX  Addressed  . PNA vac Low Risk Adult  Completed      Plan:    During the course of the visit the patient was educated and counseled about the following appropriate screening and preventive services:   Vaccines to include Pneumoccal, Influenza, Hepatitis B, Td, Zostavax, HCV  Cardiovascular Disease - encouraged regular exercise.   Colorectal cancer screening UTD- will call Dr. Electa Sniff office for report.  Due for repeat in 5 year.   Diabetes screening  Prostate Cancer Screening  Glaucoma screening  Nutrition counseling   HTN - almost at goal.  We can consider adding an ACe for better BP control   Patient Instructions (the written plan) was given to the patient.    METHENEY,CATHERINE, MD  05/07/2016

## 2016-05-07 NOTE — Patient Instructions (Signed)

## 2016-05-08 NOTE — Telephone Encounter (Signed)
No, he can address with Dr. Rayann Heman.

## 2016-05-08 NOTE — Telephone Encounter (Signed)
Pt informed of recommendations.Donald Patel  

## 2016-05-08 NOTE — Progress Notes (Signed)
All labs are normal. 

## 2016-05-20 ENCOUNTER — Ambulatory Visit (INDEPENDENT_AMBULATORY_CARE_PROVIDER_SITE_OTHER): Payer: Medicare Other | Admitting: *Deleted

## 2016-05-20 DIAGNOSIS — I442 Atrioventricular block, complete: Secondary | ICD-10-CM | POA: Diagnosis not present

## 2016-05-20 NOTE — Progress Notes (Signed)
Remote pacemaker transmission.   

## 2016-05-24 ENCOUNTER — Encounter: Payer: Self-pay | Admitting: Cardiology

## 2016-06-13 LAB — CUP PACEART REMOTE DEVICE CHECK
Battery Remaining Longevity: 116 mo
Battery Remaining Percentage: 95.5 %
Brady Statistic AP VS Percent: 1 %
Brady Statistic AS VP Percent: 73 %
Brady Statistic RA Percent Paced: 24 %
Date Time Interrogation Session: 20171211070015
Implantable Lead Implant Date: 20051123
Implantable Lead Location: 753860
Lead Channel Impedance Value: 440 Ohm
Lead Channel Pacing Threshold Amplitude: 1.125 V
Lead Channel Pacing Threshold Pulse Width: 0.5 ms
Lead Channel Sensing Intrinsic Amplitude: 12 mV
Lead Channel Sensing Intrinsic Amplitude: 5 mV
Lead Channel Setting Pacing Amplitude: 2 V
MDC IDC LEAD IMPLANT DT: 20051123
MDC IDC LEAD LOCATION: 753859
MDC IDC MSMT BATTERY VOLTAGE: 2.98 V
MDC IDC MSMT LEADCHNL RA PACING THRESHOLD AMPLITUDE: 0.5 V
MDC IDC MSMT LEADCHNL RV IMPEDANCE VALUE: 560 Ohm
MDC IDC MSMT LEADCHNL RV PACING THRESHOLD PULSEWIDTH: 0.5 ms
MDC IDC PG IMPLANT DT: 20150219
MDC IDC PG SERIAL: 7596156
MDC IDC SET LEADCHNL RV PACING AMPLITUDE: 1.375
MDC IDC SET LEADCHNL RV PACING PULSEWIDTH: 0.5 ms
MDC IDC SET LEADCHNL RV SENSING SENSITIVITY: 5 mV
MDC IDC STAT BRADY AP VP PERCENT: 25 %
MDC IDC STAT BRADY AS VS PERCENT: 1 %
MDC IDC STAT BRADY RV PERCENT PACED: 98 %

## 2016-08-19 ENCOUNTER — Ambulatory Visit (INDEPENDENT_AMBULATORY_CARE_PROVIDER_SITE_OTHER): Payer: Medicare Other | Admitting: *Deleted

## 2016-08-19 DIAGNOSIS — I442 Atrioventricular block, complete: Secondary | ICD-10-CM | POA: Diagnosis not present

## 2016-08-20 NOTE — Progress Notes (Signed)
Remote pacemaker transmission.   

## 2016-08-21 ENCOUNTER — Encounter: Payer: Self-pay | Admitting: Cardiology

## 2016-08-21 LAB — CUP PACEART REMOTE DEVICE CHECK
Battery Voltage: 2.98 V
Brady Statistic AP VP Percent: 24 %
Brady Statistic RA Percent Paced: 22 %
Brady Statistic RV Percent Paced: 98 %
Implantable Lead Implant Date: 20051123
Implantable Lead Location: 753859
Implantable Pulse Generator Implant Date: 20150219
Lead Channel Impedance Value: 550 Ohm
Lead Channel Pacing Threshold Amplitude: 0.5 V
Lead Channel Pacing Threshold Pulse Width: 0.5 ms
Lead Channel Pacing Threshold Pulse Width: 0.5 ms
Lead Channel Setting Pacing Amplitude: 1.125
Lead Channel Setting Sensing Sensitivity: 5 mV
MDC IDC LEAD IMPLANT DT: 20051123
MDC IDC LEAD LOCATION: 753860
MDC IDC MSMT BATTERY REMAINING LONGEVITY: 118 mo
MDC IDC MSMT BATTERY REMAINING PERCENTAGE: 95.5 %
MDC IDC MSMT LEADCHNL RA IMPEDANCE VALUE: 460 Ohm
MDC IDC MSMT LEADCHNL RA SENSING INTR AMPL: 5 mV
MDC IDC MSMT LEADCHNL RV PACING THRESHOLD AMPLITUDE: 0.875 V
MDC IDC MSMT LEADCHNL RV SENSING INTR AMPL: 12 mV
MDC IDC PG SERIAL: 7596156
MDC IDC SESS DTM: 20180312074015
MDC IDC SET LEADCHNL RA PACING AMPLITUDE: 2 V
MDC IDC SET LEADCHNL RV PACING PULSEWIDTH: 0.5 ms
MDC IDC STAT BRADY AP VS PERCENT: 1 %
MDC IDC STAT BRADY AS VP PERCENT: 74 %
MDC IDC STAT BRADY AS VS PERCENT: 1 %

## 2016-11-05 ENCOUNTER — Ambulatory Visit (INDEPENDENT_AMBULATORY_CARE_PROVIDER_SITE_OTHER): Payer: Medicare Other | Admitting: Family Medicine

## 2016-11-05 ENCOUNTER — Encounter: Payer: Self-pay | Admitting: Family Medicine

## 2016-11-05 VITALS — BP 118/68 | HR 70 | Ht 71.0 in | Wt 149.0 lb

## 2016-11-05 DIAGNOSIS — I10 Essential (primary) hypertension: Secondary | ICD-10-CM | POA: Diagnosis not present

## 2016-11-05 DIAGNOSIS — Z125 Encounter for screening for malignant neoplasm of prostate: Secondary | ICD-10-CM | POA: Diagnosis not present

## 2016-11-05 LAB — COMPLETE METABOLIC PANEL WITH GFR
ALBUMIN: 4.2 g/dL (ref 3.6–5.1)
ALK PHOS: 73 U/L (ref 40–115)
ALT: 14 U/L (ref 9–46)
AST: 17 U/L (ref 10–35)
BILIRUBIN TOTAL: 0.9 mg/dL (ref 0.2–1.2)
BUN: 12 mg/dL (ref 7–25)
CO2: 27 mmol/L (ref 20–31)
CREATININE: 0.93 mg/dL (ref 0.70–1.18)
Calcium: 9.5 mg/dL (ref 8.6–10.3)
Chloride: 99 mmol/L (ref 98–110)
GFR, Est African American: >89 mL/min (ref >=60)
GFR, Est Non African American: 79 mL/min (ref >=60)
Glucose, Bld: 104 mg/dL — ABNORMAL HIGH (ref 65–99)
Potassium: 3.7 mmol/L (ref 3.5–5.3)
Sodium: 139 mmol/L (ref 135–146)
Total Protein: 7.3 g/dL (ref 6.1–8.1)

## 2016-11-05 LAB — LIPID PANEL W/REFLEX DIRECT LDL
CHOLESTEROL: 209 mg/dL — AB (ref <200)
HDL: 66 mg/dL (ref >40)
LDL-Cholesterol: 123 mg/dL — ABNORMAL HIGH
Non-HDL Cholesterol (Calc): 143 mg/dL — ABNORMAL HIGH (ref <130)
TRIGLYCERIDES: 94 mg/dL (ref <150)
Total CHOL/HDL Ratio: 3.2 Ratio (ref <5.0)

## 2016-11-05 NOTE — Progress Notes (Signed)
Subjective:    CC: HTN  HPI: Hypertension- Pt denies chest pain, SOB, dizziness, or heart palpitations.  Taking meds as directed w/o problems.  Denies medication side effects.  Brought in home blood pressure log. Blood pressures at home look great. Average is 134/77. He had 2 mildly elevated pressures in the low 140s.   Past medical history, Surgical history, Family history not pertinant except as noted below, Social history, Allergies, and medications have been entered into the medical record, reviewed, and corrections made.   Review of Systems: No fevers, chills, night sweats, weight loss, chest pain, or shortness of breath.   Objective:    General: Well Developed, well nourished, and in no acute distress.  Neuro: Alert and oriented x3, extra-ocular muscles intact, sensation grossly intact.  HEENT: Normocephalic, atraumatic  Skin: Warm and dry, no rashes. Cardiac: Regular rate and rhythm, no murmurs rubs or gallops, no lower extremity edema.  Respiratory: Clear to auscultation bilaterally. Not using accessory muscles, speaking in full sentences.   Impression and Recommendations:   HTN - Home blood pressures well controlled. Continue current regimen. Due for CMP and lipid panel today.  Prostate cancer screening-due for repeat PSA today.

## 2016-11-06 LAB — PSA: PSA: 0.5 ng/mL (ref <=4.0)

## 2016-11-13 ENCOUNTER — Encounter: Payer: Self-pay | Admitting: Family Medicine

## 2016-11-13 DIAGNOSIS — R159 Full incontinence of feces: Secondary | ICD-10-CM | POA: Insufficient documentation

## 2016-11-18 ENCOUNTER — Ambulatory Visit (INDEPENDENT_AMBULATORY_CARE_PROVIDER_SITE_OTHER): Payer: Medicare Other | Admitting: *Deleted

## 2016-11-18 DIAGNOSIS — I442 Atrioventricular block, complete: Secondary | ICD-10-CM | POA: Diagnosis not present

## 2016-11-18 DIAGNOSIS — H527 Unspecified disorder of refraction: Secondary | ICD-10-CM | POA: Diagnosis not present

## 2016-11-18 DIAGNOSIS — H35373 Puckering of macula, bilateral: Secondary | ICD-10-CM | POA: Diagnosis not present

## 2016-11-18 DIAGNOSIS — Z961 Presence of intraocular lens: Secondary | ICD-10-CM | POA: Diagnosis not present

## 2016-11-18 DIAGNOSIS — H26493 Other secondary cataract, bilateral: Secondary | ICD-10-CM | POA: Diagnosis not present

## 2016-11-18 DIAGNOSIS — H3561 Retinal hemorrhage, right eye: Secondary | ICD-10-CM | POA: Diagnosis not present

## 2016-11-18 NOTE — Progress Notes (Signed)
Remote pacemaker transmission.   

## 2016-11-20 ENCOUNTER — Encounter: Payer: Self-pay | Admitting: Cardiology

## 2016-11-20 LAB — CUP PACEART REMOTE DEVICE CHECK
Battery Remaining Longevity: 118 mo
Battery Remaining Percentage: 95.5 %
Battery Voltage: 2.98 V
Brady Statistic AP VS Percent: 1 %
Brady Statistic AS VS Percent: 1 %
Brady Statistic RA Percent Paced: 21 %
Implantable Lead Implant Date: 20051123
Implantable Lead Implant Date: 20051123
Implantable Lead Location: 753860
Implantable Pulse Generator Implant Date: 20150219
Lead Channel Pacing Threshold Amplitude: 0.5 V
Lead Channel Pacing Threshold Pulse Width: 0.5 ms
Lead Channel Pacing Threshold Pulse Width: 0.5 ms
Lead Channel Sensing Intrinsic Amplitude: 5 mV
Lead Channel Setting Pacing Amplitude: 1.5 V
Lead Channel Setting Sensing Sensitivity: 5 mV
MDC IDC LEAD LOCATION: 753859
MDC IDC MSMT LEADCHNL RA IMPEDANCE VALUE: 480 Ohm
MDC IDC MSMT LEADCHNL RV IMPEDANCE VALUE: 540 Ohm
MDC IDC MSMT LEADCHNL RV PACING THRESHOLD AMPLITUDE: 1.25 V
MDC IDC MSMT LEADCHNL RV SENSING INTR AMPL: 12 mV
MDC IDC PG SERIAL: 7596156
MDC IDC SESS DTM: 20180611060015
MDC IDC SET LEADCHNL RA PACING AMPLITUDE: 2 V
MDC IDC SET LEADCHNL RV PACING PULSEWIDTH: 0.5 ms
MDC IDC STAT BRADY AP VP PERCENT: 22 %
MDC IDC STAT BRADY AS VP PERCENT: 75 %
MDC IDC STAT BRADY RV PERCENT PACED: 98 %

## 2016-12-02 ENCOUNTER — Telehealth: Payer: Self-pay | Admitting: Internal Medicine

## 2016-12-02 NOTE — Telephone Encounter (Signed)
New Message  Pt call requesting to speak with RN. Pt would like to know why he needs to have a remote check and office visit for his pacemaker. Please call back to discuss

## 2016-12-03 ENCOUNTER — Encounter: Payer: Self-pay | Admitting: Internal Medicine

## 2016-12-03 NOTE — Telephone Encounter (Signed)
Informed patient that he does have a remote scheduled in September as well as an ov. I explained to him that we are now scheduling 4 remote checks per year and one ov. I told him that the follow up is covered by his insurance, and that it is a way to make sure that patients are following up appropriately. Patient verbalized understanding.  Appt for AS scheduled for 9/26 @ 1000. Again, patient verbalized understanding.

## 2016-12-24 ENCOUNTER — Encounter: Payer: Self-pay | Admitting: Family Medicine

## 2017-02-12 ENCOUNTER — Other Ambulatory Visit: Payer: Self-pay | Admitting: Internal Medicine

## 2017-02-28 ENCOUNTER — Encounter: Payer: Self-pay | Admitting: Nurse Practitioner

## 2017-02-28 NOTE — Progress Notes (Addendum)
Electrophysiology Office Note Date: 03/05/2017  ID:  COAL NEARHOOD, DOB Oct 08, 1940, MRN [id number]  PCP: Donald Marry, MD Electrophysiologist: Donald Patel  CC: Pacemaker follow-up  Donald Patel is a 76 y.o. male seen today for Dr Donald Patel.  He presents today for routine electrophysiology followup.  Since last being seen in our clinic, the patient reports doing very well.  He denies chest pain, palpitations, dyspnea, PND, orthopnea, nausea, vomiting, dizziness, syncope, edema, weight gain, or early satiety. He remains active working in his yard.   Device History: STJ dual chamber PPM implanted 1998 for complete heart block, gen change 2015   Past Medical History:  Diagnosis Date  . AV BLOCK, COMPLETE    s/p PPM (SJM)  . Cataract both eyes corrected  . ERECTILE DYSFUNCTION, ORGANIC   . Hypertension   . Hypertension    Past Surgical History:  Procedure Laterality Date  . CATARACT EXTRACTION W/ INTRAOCULAR LENS IMPLANT  2013   right eye - [facility]  . CATARACT EXTRACTION W/ INTRAOCULAR LENS IMPLANT  2015   Left eye - [place]  . KNEE SURGERY    . PACEMAKER PLACEMENT  12/22/96   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, most recent generator (SJM) 05/02/04 both [facility]  . PERMANENT PACEMAKER GENERATOR CHANGE N/A 07/29/2013   Procedure: PERMANENT PACEMAKER GENERATOR CHANGE;  Surgeon: Donald Mark, MD;  Location: [place] CATH LAB;  Service: Cardiovascular;  Laterality: N/A;  . VASECTOMY      Current Outpatient Prescriptions  Medication Sig Dispense Refill  . hydrochlorothiazide (HYDRODIURIL) 25 MG tablet TAKE 1 TABLET(25 MG) BY MOUTH DAILY 90 tablet 0  . ibuprofen (ADVIL,MOTRIN) 200 MG tablet Take 200 mg by mouth daily as needed (pain).    . naproxen sodium (ANAPROX) 220 MG tablet Take 220 mg by mouth 2 (two) times daily as needed (pain).     No current facility-administered medications for this visit.     Allergies:   Patient has no known allergies.    Social History: Social History   Social History  . Marital status: Married    Spouse name: N/A  . Number of children: N/A  . Years of education: N/A   Occupational History  . Retired    Social History Main Topics  . Smoking status: Former Smoker    Packs/day: 1.50    Years: 35.00    Types: Cigarettes, Pipe, Cigars    Quit date: 06/10/1985  . Smokeless tobacco: Never Used  . Alcohol use No  . Drug use: No  . Sexual activity: No   Other Topics Concern  . Not on file   Social History Narrative   Recently retired,Former Furniture conservator/restorer.  Exercise.     Family History: Family History  Problem Relation Age of Onset  . Heart attack Brother   . Diabetes Unknown   . Hodgkin's lymphoma Brother      Review of Systems: All other systems reviewed and are otherwise negative except as noted above.   Physical Exam: VS:  BP (!) 148/60   Pulse 74   Resp 16   Ht 5\' 10"  (1.778 m)   Wt 152 lb 3.2 oz (69 kg)   SpO2 98%   BMI 21.84 kg/m  , BMI Body mass index is 21.84 kg/m.  GEN- The patient is elderly appearing, alert and oriented x 3 today.   HEENT: normocephalic, atraumatic; sclera clear, conjunctiva pink; hearing intact; oropharynx clear; neck supple  Lungs- Clear to ausculation  bilaterally, normal work of breathing.  No wheezes, rales, rhonchi Heart- Regular rate and rhythm, no murmurs, rubs or gallops  GI- soft, non-tender, non-distended, bowel sounds present  Extremities- no clubbing, cyanosis, or edema  MS- no significant deformity or atrophy Skin- warm and dry, no rash or lesion; PPM pocket well healed Psych- euthymic mood, full affect Neuro- strength and sensation are intact  PPM Interrogation- reviewed in detail today,  See PACEART report  EKG:  EKG is not ordered today. Recent Labs: 11/05/2016: ALT 14; BUN 12; Creat 0.93; Potassium 3.7; Sodium 139   Wt Readings from Last 3 Encounters:  03/05/17 152 lb 3.2 oz (69 kg)  11/05/16 149 lb (67.6 kg)  05/07/16 151 lb  (68.5 kg)     Other studies Reviewed: Additional studies/ records that were reviewed today include: Dr Jackalyn Lombard office notes   Assessment and Plan:  1.  Complete heart block  Normal PPM function See Pace Art report No changes today  2.  HTN Stable No change required today  3.  Paroxysmal atrial fibrillation All episodes <1 hour Discussed with patient today If burden increases, will need to consider [place] Will follow remotely    Current medicines are reviewed at length with the patient today.   The patient does not have concerns regarding his medicines.  The following changes were made today:  none  Labs/ tests ordered today include: none No orders of the defined types were placed in this encounter.    Disposition:   Follow up with Delilah Shan, Dr Donald Patel 1 year     Signed, Donald Marshall, NP 03/05/2017 10:14 AM  [facility] [address] [place] [address] [phone] (office) [phone] (fax)

## 2017-03-05 ENCOUNTER — Ambulatory Visit (INDEPENDENT_AMBULATORY_CARE_PROVIDER_SITE_OTHER): Payer: Medicare Other | Admitting: Nurse Practitioner

## 2017-03-05 ENCOUNTER — Encounter: Payer: Self-pay | Admitting: Nurse Practitioner

## 2017-03-05 DIAGNOSIS — I442 Atrioventricular block, complete: Secondary | ICD-10-CM | POA: Diagnosis not present

## 2017-03-05 NOTE — Patient Instructions (Signed)
Medication Instructions:   Your physician recommends that you continue on your current medications as directed. Please refer to the Current Medication list given to you today.   If you need a refill on your cardiac medications before your next appointment, please call your pharmacy.  Labwork: NONE ORDERED  TODAY    Testing/Procedures: NONE ORDERED  TODAY    Follow-Up:  Your physician wants you to follow-up in: [place] will receive a reminder letter in the mail two months in advance. If you don't receive a letter, please call our office to schedule the follow-up appointment.   Remote monitoring is used to monitor your Pacemaker of ICD from home. This monitoring reduces the number of office visits required to check your device to one time per year. It allows [place] to keep an eye on the functioning of your device to ensure it is working properly. You are scheduled for a device check from home on . 12-27-18You may send your transmission at any time that day. If you have a wireless device, the transmission will be sent automatically. After your physician reviews your transmission, you will receive a postcard with your next transmission date.     Any Other Special Instructions Will Be Listed Below (If Applicable).

## 2017-03-06 NOTE — Addendum Note (Signed)
Addended by: Valere Dross on: 03/06/2017 08:18 AM   Modules accepted: Orders

## 2017-03-17 LAB — CUP PACEART INCLINIC DEVICE CHECK
Date Time Interrogation Session: 20181008074451
Implantable Lead Implant Date: 20051123
Implantable Lead Implant Date: 20051123
Implantable Lead Location: 753860
MDC IDC LEAD LOCATION: 753859
MDC IDC PG IMPLANT DT: 20150219
Pulse Gen Serial Number: 7596156

## 2017-05-15 ENCOUNTER — Other Ambulatory Visit: Payer: Self-pay | Admitting: Internal Medicine

## 2017-06-05 ENCOUNTER — Ambulatory Visit (INDEPENDENT_AMBULATORY_CARE_PROVIDER_SITE_OTHER): Payer: Medicare Other | Admitting: *Deleted

## 2017-06-05 DIAGNOSIS — I442 Atrioventricular block, complete: Secondary | ICD-10-CM

## 2017-06-05 NOTE — Progress Notes (Signed)
Remote pacemaker transmission.   

## 2017-06-06 ENCOUNTER — Encounter: Payer: Self-pay | Admitting: Cardiology

## 2017-06-23 LAB — CUP PACEART REMOTE DEVICE CHECK
Battery Voltage: 2.98 V
Brady Statistic AP VP Percent: 22 %
Brady Statistic AS VS Percent: 1 %
Brady Statistic RV Percent Paced: 99 %
Implantable Lead Implant Date: 20051123
Implantable Lead Implant Date: 20051123
Implantable Lead Location: 753859
Implantable Pulse Generator Implant Date: 20150219
Lead Channel Impedance Value: 490 Ohm
Lead Channel Pacing Threshold Amplitude: 0.5 V
Lead Channel Pacing Threshold Amplitude: 1.25 V
Lead Channel Pacing Threshold Pulse Width: 0.5 ms
Lead Channel Sensing Intrinsic Amplitude: 10.8 mV
Lead Channel Sensing Intrinsic Amplitude: 5 mV
Lead Channel Setting Pacing Amplitude: 1.5 V
Lead Channel Setting Pacing Amplitude: 2 V
Lead Channel Setting Pacing Pulse Width: 0.5 ms
Lead Channel Setting Sensing Sensitivity: 5 mV
MDC IDC LEAD LOCATION: 753860
MDC IDC MSMT BATTERY REMAINING LONGEVITY: 115 mo
MDC IDC MSMT BATTERY REMAINING PERCENTAGE: 95.5 %
MDC IDC MSMT LEADCHNL RA IMPEDANCE VALUE: 450 Ohm
MDC IDC MSMT LEADCHNL RA PACING THRESHOLD PULSEWIDTH: 0.5 ms
MDC IDC PG SERIAL: 7596156
MDC IDC SESS DTM: 20181227105942
MDC IDC STAT BRADY AP VS PERCENT: 1 %
MDC IDC STAT BRADY AS VP PERCENT: 78 %
MDC IDC STAT BRADY RA PERCENT PACED: 22 %

## 2017-07-09 ENCOUNTER — Telehealth: Payer: Self-pay | Admitting: Internal Medicine

## 2017-07-09 NOTE — Telephone Encounter (Signed)
1. What dental office are you calling from? Dr. Isaac Laud   2. What is your office phone and fax number? [phone](JOITGP) [phone] (fax)  3. What type of procedure is the patient having performed? Dental extraction  4. What date is procedure scheduled or is the patient there now? 07/16/2017 11:00am  5. What is your question (ex. Antibiotics prior to procedure, holding medication-we need to know how long dentist wants pt to hold med)? Needs to know if a medication hydrochlorothiazide (HYDRODIURIL) 25 MG tablet needs to be held. Patient also has a pacemaker.

## 2017-07-10 ENCOUNTER — Telehealth: Payer: Self-pay

## 2017-07-10 NOTE — Telephone Encounter (Signed)
   [facility] Pre-operative Risk Assessment    Request for surgical clearance:  1. What type of surgery is being performed? Extraction Lower Right (Dental Procedure)  2. When is this surgery scheduled? Pending Clearance   3. What type of clearance is required (medical clearance vs. Pharmacy clearance to hold med vs. Both)? Both 3  4. Are there any medications that need to be held prior to surgery and how long? Is there any contraindication to antibiotics, use of epinephrine, to pain medications (including narcotic?    5. Practice name and name of physician performing surgery? Dr. Isaac Laud   6. What is your office phone and fax number? Phone [phone] Fax [phone]   7. Anesthesia type (None, local, MAC, general) ? N/A   Donald Patel 07/10/2017, 3:17 PM  _________________________________________________________________   (provider comments below)

## 2017-07-11 NOTE — Telephone Encounter (Signed)
   Chart reviewed as part of pre-operative protocol coverage. No need to held hydrochorthiazide prior to procedure.   I will route this recommendation to the requesting party via Epic fax function and remove from pre-op pool.  Please call with questions.  [place], [place] 07/11/2017, 1:29 PM

## 2017-07-14 NOTE — Telephone Encounter (Signed)
No contraindications Proceed with standard instructions

## 2017-07-14 NOTE — Telephone Encounter (Signed)
   Primary Cardiologist: [name], MD  Chart reviewed as part of pre-operative protocol coverage. As extractions are low risk procedures, do not typically require formal cardiac clearance. Will forward questions posed by dental office to EP MD for input - "Is there any contraindication to antibiotics, use of epinephrine, to pain medications (including narcotic?)" Dr. Rayann Heman - Please route response to P CV DIV PREOP (the pre-op pool). Thank you.  Charlie Pitter, PA-C 07/14/2017, 9:28 AM

## 2017-07-15 NOTE — Telephone Encounter (Addendum)
   Primary Cardiologist: [name], MD  Chart reviewed as part of pre-operative protocol coverage. Per Dr. Rayann Heman, no contraindications to dental extraction, antibiotics, use of epinephrine, or pain medications. He recommends to proceed with standard instructions.  Will route this bundled recommendation to requesting provider via Epic fax function. Please call with questions.  Charlie Pitter, PA-C 07/15/2017, 8:06 AM

## 2017-07-18 ENCOUNTER — Telehealth: Payer: Self-pay | Admitting: Internal Medicine

## 2017-07-18 ENCOUNTER — Telehealth: Payer: Self-pay

## 2017-07-18 NOTE — Telephone Encounter (Signed)
Agree with documentation as above. PUsh fluids and keep an eye on pulse. Pace maker was checked out today.   Beatrice Lecher, MD.

## 2017-07-18 NOTE — Telephone Encounter (Signed)
Called pt back, informed him that his pacemaker was working normally and there was no episode, asked pt about his BP he stated that this morning it was 124/68 informed him that event though his BP is normal sometimes just the motion of sitting up can cause dizziness, pt stated that he was still dizzy now. Informed pt that his could be d/t his tooth extraction causing some sinus drainage, or from the penicillin he was taking could also be a culprit. Pt stated that he doctor where he got his tooth extracted was closed today, advised pt to f/u with PCP and if he starts to pass out then he should go to the ED, pt voiced understanding.

## 2017-07-18 NOTE — Telephone Encounter (Signed)
Donald Patel is calling because he is experiencing some problems after a tooth extraction and wants to talk with you about it . Please call

## 2017-07-18 NOTE — Telephone Encounter (Signed)
Donald Patel woke this morning with dizziness. Denies chest pain, shortness of breath or vision problems. He checked his pulse and it was around 60 beats per minute. Per Dr Madilyn Fireman, patient instructed to push fluids and eat a balanced diet. Also to make sure he eats before taking the antibiotic. Advised him to go to the ED is he becomes weak or passes out. Donald Patel to call back on Monday if not completely better.     More information in note below.

## 2017-07-18 NOTE — Telephone Encounter (Signed)
Spoke with patient who had a tooth extracted on 2/6.  He took 500mg  Amoxicillin.  This morning his "pacemaker felt funny".  He got up and was dizzy.  He has no SOB or CP.  He will send [place] a transmission.

## 2017-09-04 ENCOUNTER — Ambulatory Visit (INDEPENDENT_AMBULATORY_CARE_PROVIDER_SITE_OTHER): Payer: Medicare Other | Admitting: *Deleted

## 2017-09-04 DIAGNOSIS — I442 Atrioventricular block, complete: Secondary | ICD-10-CM

## 2017-09-05 NOTE — Progress Notes (Signed)
Remote pacemaker transmission.   

## 2017-09-08 ENCOUNTER — Encounter: Payer: Self-pay | Admitting: Cardiology

## 2017-09-18 LAB — CUP PACEART REMOTE DEVICE CHECK
Brady Statistic AP VP Percent: 17 %
Brady Statistic AS VP Percent: 83 %
Brady Statistic AS VS Percent: 1 %
Implantable Lead Implant Date: 20051123
Lead Channel Impedance Value: 480 Ohm
Lead Channel Pacing Threshold Pulse Width: 0.5 ms
Lead Channel Sensing Intrinsic Amplitude: 10.8 mV
Lead Channel Setting Pacing Amplitude: 1.125
Lead Channel Setting Pacing Amplitude: 2 V
Lead Channel Setting Pacing Pulse Width: 0.5 ms
MDC IDC LEAD IMPLANT DT: 20051123
MDC IDC LEAD LOCATION: 753859
MDC IDC LEAD LOCATION: 753860
MDC IDC MSMT BATTERY REMAINING LONGEVITY: 113 mo
MDC IDC MSMT BATTERY REMAINING PERCENTAGE: 95.5 %
MDC IDC MSMT BATTERY VOLTAGE: 2.96 V
MDC IDC MSMT LEADCHNL RA IMPEDANCE VALUE: 460 Ohm
MDC IDC MSMT LEADCHNL RA PACING THRESHOLD AMPLITUDE: 0.5 V
MDC IDC MSMT LEADCHNL RA PACING THRESHOLD PULSEWIDTH: 0.5 ms
MDC IDC MSMT LEADCHNL RA SENSING INTR AMPL: 4.5 mV
MDC IDC MSMT LEADCHNL RV PACING THRESHOLD AMPLITUDE: 0.875 V
MDC IDC PG IMPLANT DT: 20150219
MDC IDC PG SERIAL: 7596156
MDC IDC SESS DTM: 20190328063339
MDC IDC SET LEADCHNL RV SENSING SENSITIVITY: 5 mV
MDC IDC STAT BRADY AP VS PERCENT: 1 %
MDC IDC STAT BRADY RA PERCENT PACED: 17 %
MDC IDC STAT BRADY RV PERCENT PACED: 99 %

## 2017-12-04 ENCOUNTER — Encounter: Payer: Self-pay | Admitting: Cardiology

## 2017-12-04 ENCOUNTER — Ambulatory Visit (INDEPENDENT_AMBULATORY_CARE_PROVIDER_SITE_OTHER): Payer: Medicare Other | Admitting: *Deleted

## 2017-12-04 DIAGNOSIS — I442 Atrioventricular block, complete: Secondary | ICD-10-CM

## 2017-12-04 NOTE — Progress Notes (Signed)
Remote pacemaker transmission.   

## 2017-12-09 LAB — CUP PACEART REMOTE DEVICE CHECK
Battery Remaining Longevity: 109 mo
Brady Statistic AP VS Percent: 1 %
Brady Statistic AS VP Percent: 82 %
Brady Statistic AS VS Percent: 1 %
Date Time Interrogation Session: 20190627060012
Implantable Lead Implant Date: 20051123
Implantable Lead Location: 753859
Lead Channel Impedance Value: 450 Ohm
Lead Channel Impedance Value: 510 Ohm
Lead Channel Pacing Threshold Amplitude: 1.25 V
Lead Channel Pacing Threshold Pulse Width: 0.5 ms
Lead Channel Sensing Intrinsic Amplitude: 10.8 mV
Lead Channel Sensing Intrinsic Amplitude: 5 mV
Lead Channel Setting Pacing Amplitude: 2 V
Lead Channel Setting Pacing Pulse Width: 0.5 ms
MDC IDC LEAD IMPLANT DT: 20051123
MDC IDC LEAD LOCATION: 753860
MDC IDC MSMT BATTERY REMAINING PERCENTAGE: 95.5 %
MDC IDC MSMT BATTERY VOLTAGE: 2.96 V
MDC IDC MSMT LEADCHNL RA PACING THRESHOLD AMPLITUDE: 0.5 V
MDC IDC MSMT LEADCHNL RA PACING THRESHOLD PULSEWIDTH: 0.5 ms
MDC IDC PG IMPLANT DT: 20150219
MDC IDC PG SERIAL: 7596156
MDC IDC SET LEADCHNL RV PACING AMPLITUDE: 1.5 V
MDC IDC SET LEADCHNL RV SENSING SENSITIVITY: 5 mV
MDC IDC STAT BRADY AP VP PERCENT: 18 %
MDC IDC STAT BRADY RA PERCENT PACED: 17 %
MDC IDC STAT BRADY RV PERCENT PACED: 99 %

## 2018-01-08 DIAGNOSIS — M549 Dorsalgia, unspecified: Secondary | ICD-10-CM | POA: Insufficient documentation

## 2018-01-10 ENCOUNTER — Encounter: Payer: Self-pay | Admitting: Family Medicine

## 2018-01-16 ENCOUNTER — Encounter: Payer: Self-pay | Admitting: Family Medicine

## 2018-01-29 ENCOUNTER — Encounter: Payer: Self-pay | Admitting: Family Medicine

## 2018-01-29 ENCOUNTER — Ambulatory Visit (INDEPENDENT_AMBULATORY_CARE_PROVIDER_SITE_OTHER): Payer: Medicare Other | Admitting: Family Medicine

## 2018-01-29 VITALS — BP 153/63 | HR 70 | Ht 70.0 in | Wt 148.0 lb

## 2018-01-29 DIAGNOSIS — R232 Flushing: Secondary | ICD-10-CM | POA: Diagnosis not present

## 2018-01-29 DIAGNOSIS — R0789 Other chest pain: Secondary | ICD-10-CM

## 2018-01-29 DIAGNOSIS — M546 Pain in thoracic spine: Secondary | ICD-10-CM

## 2018-01-29 DIAGNOSIS — R5383 Other fatigue: Secondary | ICD-10-CM | POA: Diagnosis not present

## 2018-01-29 DIAGNOSIS — R200 Anesthesia of skin: Secondary | ICD-10-CM | POA: Diagnosis not present

## 2018-01-29 DIAGNOSIS — E559 Vitamin D deficiency, unspecified: Secondary | ICD-10-CM | POA: Diagnosis not present

## 2018-01-29 MED ORDER — LISINOPRIL 20 MG PO TABS: 20.0000 mg | ORAL_TABLET | Freq: Every day | ORAL | 0 refills | Status: DC

## 2018-01-29 NOTE — Progress Notes (Signed)
Subjective:    Patient ID: Donald Patel, male    DOB: [birth year], 77 y.o.   MRN: [id number]  HPI 77 year old male comes in today complaining flushing.  He says for quite some time now he is been getting these intermittent episodes where he will get pain across his mid thoracic back bilaterally.  Sometimes it will actually radiate around into his chest and his chest will almost feel a little tight but not painful.  Then he will feel flushed in his face and sometimes even gets a bilateral numbness around both eyes and into the facial cheeks bilaterally its fairly symmetric.  He says he is looked in the mirror and not seen any actual redness or changes in skin color or to his face.  He says that once it happens it will last about 3 hours and sometimes it will actually last throughout the rest of the day.  He is noticed that it happens more often after taking his blood pressure pill about an hour afterwards.  He typically takes his blood pressure pill around 9:51 AM.  He denies any shortness of breath with it.  He denies any new onset sinus symptoms.  He says it was similar to symptoms he was experiencing when he first started hydrochlorothiazide.  He is been on that medication for years and the dose has not changed and he has not added any new medications.  He has checked his BP during these episodes and blood pressures have fluctuated anywhere between a systolic of 841 up to a systolic of 324 but usually runs in the 130s and 140s.  So there does not seem to be any consistency in relation to his blood pressure.  Note that his bowels have changed recently.  He says normally has a bowel movement every day after he drinks his cup of coffee but over the last month he has been having having issues more with constipation and is had to take medication to help with that but it does seem to work.  He does have a pacemaker which he downloads every 3 months he has not had any problems with it.  In fact his follow-up  with cardiology, Dr. Rayann Heman is actually next month.  Nuys any urinary symptoms.   Review of Systems  BP (!) 153/63   Pulse 70   Ht 5\' 10"  (1.778 m)   Wt 148 lb (67.1 kg)   SpO2 99%   BMI 21.24 kg/m     No Known Allergies  Past Medical History:  Diagnosis Date  . AV BLOCK, COMPLETE    s/p PPM (SJM)  . Cataract both eyes corrected  . ERECTILE DYSFUNCTION, ORGANIC   . Hypertension   . Hypertension     Past Surgical History:  Procedure Laterality Date  . CATARACT EXTRACTION W/ INTRAOCULAR LENS IMPLANT  2013   right eye - [facility]  . CATARACT EXTRACTION W/ INTRAOCULAR LENS IMPLANT  2015   Left eye - [place]  . KNEE SURGERY    . PACEMAKER PLACEMENT  12/22/96   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, most recent generator (SJM) 05/02/04 both implanted by Dr Rebecka Apley [facility]  . PERMANENT PACEMAKER GENERATOR CHANGE N/A 07/29/2013   Procedure: PERMANENT PACEMAKER GENERATOR CHANGE;  Surgeon: Coralyn Mark, MD;  Location: [place] CATH LAB;  Service: Cardiovascular;  Laterality: N/A;  . VASECTOMY      Social History   Socioeconomic History  . Marital status: Married    Spouse name: Not on file  .  Number of children: Not on file  . Years of education: Not on file  . Highest education level: Not on file  Occupational History  . Occupation: Retired  Scientific laboratory technician  . Financial resource strain: Not on file  . Food insecurity:    Worry: Not on file    Inability: Not on file  . Transportation needs:    Medical: Not on file    Non-medical: Not on file  Tobacco Use  . Smoking status: Former Smoker    Packs/day: 1.50    Years: 35.00    Pack years: 52.50    Types: Cigarettes, Pipe, Cigars    Last attempt to quit: 06/10/1985    Years since quitting: 32.6  . Smokeless tobacco: Never Used  Substance and Sexual Activity  . Alcohol use: No  . Drug use: No  . Sexual activity: Never  Lifestyle  . Physical activity:    Days per week: Not on file    Minutes per session: Not on file  .  Stress: Not on file  Relationships  . Social connections:    Talks on phone: Not on file    Gets together: Not on file    Attends religious service: Not on file    Active member of club or organization: Not on file    Attends meetings of clubs or organizations: Not on file    Relationship status: Not on file  . Intimate partner violence:    Fear of current or ex partner: Not on file    Emotionally abused: Not on file    Physically abused: Not on file    Forced sexual activity: Not on file  Other Topics Concern  . Not on file  Social History Narrative   Recently retired,Former Furniture conservator/restorer.  Exercise.     Family History  Problem Relation Age of Onset  . Heart attack Brother   . Diabetes Unknown   . Hodgkin's lymphoma Brother     Outpatient Encounter Medications as of 01/29/2018  Medication Sig  . ibuprofen (ADVIL,MOTRIN) 200 MG tablet Take 200 mg by mouth daily as needed (pain).  Marland Kitchen lisinopril (PRINIVIL,ZESTRIL) 20 MG tablet Take 1 tablet (20 mg total) by mouth daily.  . naproxen sodium (ANAPROX) 220 MG tablet Take 220 mg by mouth 2 (two) times daily as needed (pain).  . [DISCONTINUED] hydrochlorothiazide (HYDRODIURIL) 25 MG tablet TAKE 1 TABLET(25 MG) BY MOUTH DAILY   No facility-administered encounter medications on file as of 01/29/2018.        Objective:   Physical Exam  Constitutional: He is oriented to person, place, and time. He appears well-developed and well-nourished.  HENT:  Head: Normocephalic and atraumatic.  Right Ear: External ear normal.  Left Ear: External ear normal.  Nose: Nose normal.  Mouth/Throat: Oropharynx is clear and moist.  TMs and canals are clear.   Eyes: Pupils are equal, round, and reactive to light. Conjunctivae and EOM are normal.  Neck: Neck supple. No thyromegaly present.  Cardiovascular: Normal rate, regular rhythm and normal heart sounds.  Pulmonary/Chest: Effort normal and breath sounds normal.  Abdominal: Soft. Bowel sounds are  normal. He exhibits no distension and no mass. There is no tenderness. There is no rebound and no guarding. No hernia.  Lymphadenopathy:    He has no cervical adenopathy.  Neurological: He is alert and oriented to person, place, and time.  Skin: Skin is warm and dry.  Psychiatric: He has a normal mood and affect. His behavior is normal.  Assessment & Plan:  Facial numbness-unclear etiology this is a little bit unusual.  Consider hypercalcemia or thyroid disorder.  Also consider other possibilities such as MS.  Flushing-unclear etiology.  It does not seem to relate to either low or high blood pressure when he is checking it at home so this is a little unusual though it does seem to occur more often about an hour after taking his blood pressure pills we will discontinue the hydrochlorthiazide and try something else.  We will send in a new prescription to the pharmacy for [place] in April.  Last renal function on file was normal we will get up-to-date renal function as well.  Chest tightness-EKG today with normal sinus rhythm.  He does have a pacemaker.  Rate of 60 bpm.  R waves are much higher in lead I but compared to previous EKG but otherwise normal.  Unsure if this is related more to the back pain and its just radiating.  Mid back pain.  He really points over his mid back over both bilateral kidneys.  We will check renal function as well as urinalysis.

## 2018-01-30 LAB — URINALYSIS, ROUTINE W REFLEX MICROSCOPIC
BILIRUBIN URINE: NEGATIVE
GLUCOSE, UA: NEGATIVE
HGB URINE DIPSTICK: NEGATIVE
KETONES UR: NEGATIVE
Leukocytes, UA: NEGATIVE
Nitrite: NEGATIVE
PROTEIN: NEGATIVE
Specific Gravity, Urine: 1.009 (ref 1.001–1.03)
pH: 7 (ref 5.0–8.0)

## 2018-01-30 LAB — COMPLETE METABOLIC PANEL WITH GFR
AG Ratio: 1.5 (calc) (ref 1.0–2.5)
ALBUMIN MSPROF: 4.2 g/dL (ref 3.6–5.1)
ALT: 12 U/L (ref 9–46)
AST: 16 U/L (ref 10–35)
Alkaline phosphatase (APISO): 70 U/L (ref 40–115)
BILIRUBIN TOTAL: 0.6 mg/dL (ref 0.2–1.2)
BUN: 10 mg/dL (ref 7–25)
CALCIUM: 9.5 mg/dL (ref 8.6–10.3)
CHLORIDE: 99 mmol/L (ref 98–110)
CO2: 28 mmol/L (ref 20–32)
CREATININE: 0.96 mg/dL (ref 0.70–1.18)
GFR, EST AFRICAN AMERICAN: 87 mL/min/{1.73_m2} (ref >=60)
GFR, EST NON AFRICAN AMERICAN: 75 mL/min/{1.73_m2} (ref >=60)
GLUCOSE: 105 mg/dL — AB (ref 65–99)
Globulin: 2.8 g/dL (calc) (ref 1.9–3.7)
Potassium: 4.3 mmol/L (ref 3.5–5.3)
Sodium: 140 mmol/L (ref 135–146)
TOTAL PROTEIN: 7 g/dL (ref 6.1–8.1)

## 2018-01-30 LAB — CBC
HEMATOCRIT: 45.1 % (ref 38.5–50.0)
HEMOGLOBIN: 16.2 g/dL (ref 13.2–17.1)
MCH: 32.7 pg (ref 27.0–33.0)
MCHC: 35.9 g/dL (ref 32.0–36.0)
MCV: 91.1 fL (ref 80.0–100.0)
MPV: 12.1 fL (ref 7.5–12.5)
PLATELETS: 216 10*3/uL (ref 140–400)
RBC: 4.95 10*6/uL (ref 4.20–5.80)
RDW: 12.3 % (ref 11.0–15.0)
WBC: 6.9 10*3/uL (ref 3.8–10.8)

## 2018-01-30 LAB — LIPID PANEL
CHOL/HDL RATIO: 3.1 (calc) (ref <5.0)
Cholesterol: 203 mg/dL — ABNORMAL HIGH (ref <200)
HDL: 65 mg/dL (ref >40)
LDL CHOLESTEROL (CALC): 118 mg/dL — AB
NON-HDL CHOLESTEROL (CALC): 138 mg/dL — AB (ref <130)
TRIGLYCERIDES: 97 mg/dL (ref <150)

## 2018-01-30 LAB — VITAMIN D 25 HYDROXY (VIT D DEFICIENCY, FRACTURES): VIT D 25 HYDROXY: 32 ng/mL (ref 30–100)

## 2018-01-30 LAB — PTH, INTACT AND CALCIUM
Calcium: 9.5 mg/dL (ref 8.6–10.3)
PTH: 43 pg/mL (ref 14–64)

## 2018-01-30 LAB — TSH: TSH: 1.71 mIU/L (ref 0.40–4.50)

## 2018-02-06 ENCOUNTER — Encounter: Payer: Self-pay | Admitting: Family Medicine

## 2018-02-06 DIAGNOSIS — M546 Pain in thoracic spine: Secondary | ICD-10-CM

## 2018-02-06 DIAGNOSIS — I10 Essential (primary) hypertension: Secondary | ICD-10-CM

## 2018-02-06 DIAGNOSIS — R0789 Other chest pain: Secondary | ICD-10-CM

## 2018-02-06 DIAGNOSIS — R232 Flushing: Secondary | ICD-10-CM

## 2018-02-06 NOTE — Telephone Encounter (Signed)
Printed for referral coordinator to review.

## 2018-02-10 ENCOUNTER — Encounter (HOSPITAL_BASED_OUTPATIENT_CLINIC_OR_DEPARTMENT_OTHER): Payer: Self-pay

## 2018-02-10 ENCOUNTER — Telehealth: Payer: Self-pay

## 2018-02-10 ENCOUNTER — Ambulatory Visit (HOSPITAL_BASED_OUTPATIENT_CLINIC_OR_DEPARTMENT_OTHER)
Admission: RE | Admit: 2018-02-10 | Discharge: 2018-02-10 | Disposition: A | Discharge status: Home / Self Care | Payer: Medicare Other | Source: Ambulatory Visit | Attending: Family Medicine | Admitting: Family Medicine

## 2018-02-10 ENCOUNTER — Telehealth: Payer: Self-pay | Admitting: Internal Medicine

## 2018-02-10 DIAGNOSIS — R918 Other nonspecific abnormal finding of lung field: Secondary | ICD-10-CM | POA: Diagnosis not present

## 2018-02-10 DIAGNOSIS — I700 Atherosclerosis of aorta: Secondary | ICD-10-CM | POA: Insufficient documentation

## 2018-02-10 DIAGNOSIS — J439 Emphysema, unspecified: Secondary | ICD-10-CM | POA: Diagnosis not present

## 2018-02-10 DIAGNOSIS — R0789 Other chest pain: Secondary | ICD-10-CM | POA: Diagnosis not present

## 2018-02-10 DIAGNOSIS — R079 Chest pain, unspecified: Secondary | ICD-10-CM | POA: Diagnosis not present

## 2018-02-10 MED ORDER — IOPAMIDOL (ISOVUE-370) INJECTION 76%
100.0000 mL | Freq: Once | INTRAVENOUS | Status: AC | PRN
  Administered 2018-02-10: 100 mL via INTRAVENOUS

## 2018-02-10 NOTE — Telephone Encounter (Signed)
New message   Contacted the patient today to set up a referral for chest pain. The patient is adamant that he wants to see Dr. Rayann Heman for this referral. I advised the patient that he needs to bee seen by a general cardiologist per [facility]. Please contact the patient to discuss.

## 2018-02-10 NOTE — Telephone Encounter (Signed)
Pt more than happy to see Amber on 02/18/2018.  Apologized to Pt for the confusion.

## 2018-02-10 NOTE — Telephone Encounter (Signed)
Error

## 2018-02-16 NOTE — Progress Notes (Signed)
Electrophysiology Office Note Date: 02/18/2018  ID:  Donald Patel, DOB 08/14/1940, MRN [id number]  PCP: Hali Marry, MD Electrophysiologist: Rayann Heman  CC: Pacemaker follow-up  Donald Patel is a 77 y.o. male seen today for Dr Rayann Heman.  He presents today for routine electrophysiology followup.  Since last being seen in our clinic, the patient reports doing relatively well.  He denies chest pain, palpitations, dyspnea, PND, orthopnea, nausea, vomiting, dizziness, syncope, edema, weight gain, or early satiety.  Device History: STJ dual chamber PPM implanted 1998 for complete heart block, gen change 2015  Past Medical History:  Diagnosis Date  . AV BLOCK, COMPLETE    s/p PPM (SJM)  . Cataract both eyes corrected  . ERECTILE DYSFUNCTION, ORGANIC   . Hypertension   . Hypertension    Past Surgical History:  Procedure Laterality Date  . CATARACT EXTRACTION W/ INTRAOCULAR LENS IMPLANT  2013   right eye - [facility]  . CATARACT EXTRACTION W/ INTRAOCULAR LENS IMPLANT  2015   Left eye - [place]  . KNEE SURGERY    . PACEMAKER PLACEMENT  12/22/96   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, most recent generator (SJM) 05/02/04 both implanted by Dr Rebecka Apley [facility]  . PERMANENT PACEMAKER GENERATOR CHANGE N/A 07/29/2013   Procedure: PERMANENT PACEMAKER GENERATOR CHANGE;  Surgeon: Coralyn Mark, MD;  Location: [place] CATH LAB;  Service: Cardiovascular;  Laterality: N/A;  . VASECTOMY      Current Outpatient Medications  Medication Sig Dispense Refill  . hydrochlorothiazide (HYDRODIURIL) 25 MG tablet Take 25 mg by mouth daily.    Marland Kitchen ibuprofen (ADVIL,MOTRIN) 200 MG tablet Take 200 mg by mouth daily as needed (pain).    . naproxen sodium (ANAPROX) 220 MG tablet Take 220 mg by mouth 2 (two) times daily as needed (pain).     No current facility-administered medications for this visit.     Allergies:   Patient has no known allergies.   Social History: Social History   Socioeconomic  History  . Marital status: Married    Spouse name: Not on file  . Number of children: Not on file  . Years of education: Not on file  . Highest education level: Not on file  Occupational History  . Occupation: Retired  Scientific laboratory technician  . Financial resource strain: Not on file  . Food insecurity:    Worry: Not on file    Inability: Not on file  . Transportation needs:    Medical: Not on file    Non-medical: Not on file  Tobacco Use  . Smoking status: Former Smoker    Packs/day: 1.50    Years: 35.00    Pack years: 52.50    Types: Cigarettes, Pipe, Cigars    Last attempt to quit: 06/10/1985    Years since quitting: 32.7  . Smokeless tobacco: Never Used  Substance and Sexual Activity  . Alcohol use: No  . Drug use: No  . Sexual activity: Never  Lifestyle  . Physical activity:    Days per week: Not on file    Minutes per session: Not on file  . Stress: Not on file  Relationships  . Social connections:    Talks on phone: Not on file    Gets together: Not on file    Attends religious service: Not on file    Active member of club or organization: Not on file    Attends meetings of clubs or organizations: Not on file    Relationship  status: Not on file  . Intimate partner violence:    Fear of current or ex partner: Not on file    Emotionally abused: Not on file    Physically abused: Not on file    Forced sexual activity: Not on file  Other Topics Concern  . Not on file  Social History Narrative   Recently retired,Former Furniture conservator/restorer.  Exercise.     Family History: Family History  Problem Relation Age of Onset  . Heart attack Brother   . Diabetes Unknown   . Hodgkin's lymphoma Brother      Review of Systems: All other systems reviewed and are otherwise negative except as noted above.   Physical Exam: VS:  BP (!) 146/80   Pulse 83   Ht 5\' 10"  (1.778 m)   Wt 147 lb (66.7 kg)   SpO2 98%   BMI 21.09 kg/m  , BMI Body mass index is 21.09 kg/m.  GEN- The patient is  well appearing, alert and oriented x 3 today.   HEENT: normocephalic, atraumatic; sclera clear, conjunctiva pink; hearing intact; oropharynx clear; neck supple  Lungs- Clear to ausculation bilaterally, normal work of breathing.  No wheezes, rales, rhonchi Heart- Regular rate and rhythm, no murmurs, rubs or gallops  GI- soft, non-tender, non-distended, bowel sounds present  Extremities- no clubbing, cyanosis, or edema  MS- no significant deformity or atrophy Skin- warm and dry, no rash or lesion; PPM pocket well healed Psych- euthymic mood, full affect Neuro- strength and sensation are intact  PPM Interrogation- reviewed in detail today,  See PACEART report  EKG:  EKG is not ordered today.  Recent Labs: 01/29/2018: ALT 12; BUN 10; Creat 0.96; Hemoglobin 16.2; Platelets 216; Potassium 4.3; Sodium 140; TSH 1.71   Wt Readings from Last 3 Encounters:  02/18/18 147 lb (66.7 kg)  01/29/18 148 lb (67.1 kg)  03/05/17 152 lb 3.2 oz (69 kg)     Assessment and Plan:  1.  Complete heart block Normal PPM function See Pace Art report No changes today  2.  HTN Stable No change required today  3.  Paroxysmal atrial fibrillation Burden by device interrogation <1% No AF identified on device interrogation today (some AT noted) If burden increases, will need to consider [place]    Current medicines are reviewed at length with the patient today.   The patient does not have concerns regarding his medicines.  The following changes were made today:  none  Labs/ tests ordered today include: none Orders Placed This Encounter  Procedures  . CUP PACEART INCLINIC DEVICE CHECK     Disposition:   Follow up with Delilah Shan, Dr Rayann Heman in 1 year     Signed, [name], NP 02/18/2018 12:47 PM  [facility] [address] [place] [address] [phone] (office) [phone] (fax)

## 2018-02-18 ENCOUNTER — Ambulatory Visit: Payer: Medicare Other | Admitting: Nurse Practitioner

## 2018-02-18 ENCOUNTER — Encounter: Payer: Self-pay | Admitting: Nurse Practitioner

## 2018-02-18 VITALS — BP 146/80 | HR 83 | Ht 70.0 in | Wt 147.0 lb

## 2018-02-18 DIAGNOSIS — I10 Essential (primary) hypertension: Secondary | ICD-10-CM | POA: Diagnosis not present

## 2018-02-18 DIAGNOSIS — I442 Atrioventricular block, complete: Secondary | ICD-10-CM | POA: Diagnosis not present

## 2018-02-18 DIAGNOSIS — I480 Paroxysmal atrial fibrillation: Secondary | ICD-10-CM | POA: Diagnosis not present

## 2018-02-18 LAB — CUP PACEART INCLINIC DEVICE CHECK
Date Time Interrogation Session: 20190911121336
Implantable Lead Implant Date: 20051123
Implantable Lead Location: 753859
Implantable Lead Location: 753860
Implantable Pulse Generator Implant Date: 20150219
MDC IDC LEAD IMPLANT DT: 20051123
MDC IDC PG SERIAL: 7596156
Pulse Gen Model: 2240

## 2018-02-18 NOTE — Patient Instructions (Addendum)
Medication Instructions:   Your physician recommends that you continue on your current medications as directed. Please refer to the Current Medication list given to you today.   If you need a refill on your cardiac medications before your next appointment, please call your pharmacy.  Labwork: NONE ORDERED  TODAY    Testing/Procedures: NONE ORDERED  TODAY    Follow-Up: Your physician wants you to follow-up in: [place] will receive a reminder letter in the mail two months in advance. If you don't receive a letter, please call our office to schedule the follow-up appointment.     Remote monitoring is used to monitor your Pacemaker of ICD from home. This monitoring reduces the number of office visits required to check your device to one time per year. It allows [place] to keep an eye on the functioning of your device to ensure it is working properly. You are scheduled for a device check from home on . 03-05-18  You may send your transmission at any time that day. If you have a wireless device, the transmission will be sent automatically. After your physician reviews your transmission, you will receive a postcard with your next transmission date.     Any Other Special Instructions Will Be Listed Below (If Applicable).

## 2018-02-23 ENCOUNTER — Other Ambulatory Visit: Payer: Self-pay | Admitting: Internal Medicine

## 2018-03-05 ENCOUNTER — Ambulatory Visit (INDEPENDENT_AMBULATORY_CARE_PROVIDER_SITE_OTHER): Payer: Medicare Other | Admitting: *Deleted

## 2018-03-05 DIAGNOSIS — I442 Atrioventricular block, complete: Secondary | ICD-10-CM

## 2018-03-05 NOTE — Progress Notes (Signed)
Remote pacemaker transmission.   

## 2018-03-06 ENCOUNTER — Encounter: Payer: Self-pay | Admitting: Cardiology

## 2018-04-15 LAB — CUP PACEART REMOTE DEVICE CHECK
Brady Statistic AP VP Percent: 21 %
Brady Statistic AP VS Percent: 1 %
Brady Statistic AS VP Percent: 79 %
Brady Statistic RA Percent Paced: 21 %
Brady Statistic RV Percent Paced: 99 %
Implantable Lead Location: 753859
Implantable Lead Location: 753860
Lead Channel Impedance Value: 450 Ohm
Lead Channel Impedance Value: 540 Ohm
Lead Channel Pacing Threshold Amplitude: 0.5 V
Lead Channel Pacing Threshold Amplitude: 1.25 V
Lead Channel Pacing Threshold Pulse Width: 0.5 ms
Lead Channel Sensing Intrinsic Amplitude: 10.8 mV
Lead Channel Setting Pacing Amplitude: 1.5 V
Lead Channel Setting Pacing Amplitude: 2 V
MDC IDC LEAD IMPLANT DT: 20051123
MDC IDC LEAD IMPLANT DT: 20051123
MDC IDC MSMT BATTERY REMAINING LONGEVITY: 109 mo
MDC IDC MSMT BATTERY REMAINING PERCENTAGE: 95.5 %
MDC IDC MSMT BATTERY VOLTAGE: 2.96 V
MDC IDC MSMT LEADCHNL RA SENSING INTR AMPL: 5 mV
MDC IDC MSMT LEADCHNL RV PACING THRESHOLD PULSEWIDTH: 0.5 ms
MDC IDC PG IMPLANT DT: 20150219
MDC IDC SESS DTM: 20190926060014
MDC IDC SET LEADCHNL RV PACING PULSEWIDTH: 0.5 ms
MDC IDC SET LEADCHNL RV SENSING SENSITIVITY: 5 mV
MDC IDC STAT BRADY AS VS PERCENT: 1 %
Pulse Gen Serial Number: 7596156

## 2018-06-04 ENCOUNTER — Ambulatory Visit (INDEPENDENT_AMBULATORY_CARE_PROVIDER_SITE_OTHER): Payer: Medicare Other

## 2018-06-04 DIAGNOSIS — I442 Atrioventricular block, complete: Secondary | ICD-10-CM

## 2018-06-04 NOTE — Progress Notes (Signed)
Remote pacemaker transmission.   

## 2018-06-07 LAB — CUP PACEART REMOTE DEVICE CHECK
Battery Remaining Longevity: 111 mo
Brady Statistic AP VP Percent: 21 %
Brady Statistic AP VS Percent: 1 %
Brady Statistic AS VP Percent: 79 %
Brady Statistic AS VS Percent: 1 %
Brady Statistic RA Percent Paced: 20 %
Brady Statistic RV Percent Paced: 99 %
Date Time Interrogation Session: 20191226070014
Implantable Lead Implant Date: 20051123
Implantable Lead Implant Date: 20051123
Implantable Lead Location: 753859
Implantable Lead Location: 753860
Implantable Pulse Generator Implant Date: 20150219
Lead Channel Impedance Value: 510 Ohm
Lead Channel Pacing Threshold Amplitude: 0.5 V
Lead Channel Pacing Threshold Amplitude: 1.25 V
Lead Channel Pacing Threshold Pulse Width: 0.5 ms
Lead Channel Pacing Threshold Pulse Width: 0.5 ms
Lead Channel Sensing Intrinsic Amplitude: 10.8 mV
Lead Channel Setting Pacing Amplitude: 1.5 V
Lead Channel Setting Pacing Amplitude: 2 V
Lead Channel Setting Pacing Pulse Width: 0.5 ms
Lead Channel Setting Sensing Sensitivity: 5 mV
MDC IDC MSMT BATTERY REMAINING PERCENTAGE: 95.5 %
MDC IDC MSMT BATTERY VOLTAGE: 2.96 V
MDC IDC MSMT LEADCHNL RA IMPEDANCE VALUE: 450 Ohm
MDC IDC MSMT LEADCHNL RA SENSING INTR AMPL: 5 mV
Pulse Gen Model: 2240
Pulse Gen Serial Number: 7596156

## 2018-07-13 ENCOUNTER — Ambulatory Visit: Payer: Self-pay

## 2018-09-03 ENCOUNTER — Encounter: Payer: Medicare Other | Admitting: Cardiology

## 2018-09-03 ENCOUNTER — Other Ambulatory Visit: Payer: Self-pay

## 2018-09-03 LAB — CUP PACEART REMOTE DEVICE CHECK
Battery Remaining Longevity: 113 mo
Battery Remaining Percentage: 95.5 %
Battery Voltage: 2.96 V
Brady Statistic AP VP Percent: 17 %
Brady Statistic AP VS Percent: 1 %
Brady Statistic AS VS Percent: 1 %
Brady Statistic RV Percent Paced: 99 %
Date Time Interrogation Session: 20200326072235
Implantable Lead Implant Date: 20051123
Implantable Lead Location: 753859
Implantable Lead Location: 753860
Implantable Pulse Generator Implant Date: 20150219
Lead Channel Impedance Value: 450 Ohm
Lead Channel Impedance Value: 550 Ohm
Lead Channel Pacing Threshold Amplitude: 0.5 V
Lead Channel Pacing Threshold Amplitude: 1.375 V
Lead Channel Pacing Threshold Pulse Width: 0.5 ms
Lead Channel Pacing Threshold Pulse Width: 0.5 ms
Lead Channel Sensing Intrinsic Amplitude: 11 mV
Lead Channel Sensing Intrinsic Amplitude: 5 mV
Lead Channel Setting Pacing Amplitude: 1.625
Lead Channel Setting Pacing Amplitude: 2 V
Lead Channel Setting Pacing Pulse Width: 0.5 ms
MDC IDC LEAD IMPLANT DT: 20051123
MDC IDC SET LEADCHNL RV SENSING SENSITIVITY: 5 mV
MDC IDC STAT BRADY AS VP PERCENT: 83 %
MDC IDC STAT BRADY RA PERCENT PACED: 17 %
Pulse Gen Model: 2240
Pulse Gen Serial Number: 7596156

## 2018-12-03 ENCOUNTER — Ambulatory Visit (INDEPENDENT_AMBULATORY_CARE_PROVIDER_SITE_OTHER): Payer: Medicare Other | Admitting: *Deleted

## 2018-12-03 DIAGNOSIS — I442 Atrioventricular block, complete: Secondary | ICD-10-CM | POA: Diagnosis not present

## 2018-12-03 LAB — CUP PACEART REMOTE DEVICE CHECK
Battery Remaining Longevity: 104 mo
Battery Remaining Percentage: 89 %
Battery Voltage: 2.95 V
Brady Statistic AP VP Percent: 17 %
Brady Statistic AP VS Percent: 1 %
Brady Statistic AS VP Percent: 83 %
Brady Statistic AS VS Percent: 1 %
Brady Statistic RA Percent Paced: 17 %
Brady Statistic RV Percent Paced: 99 %
Date Time Interrogation Session: 20200625060039
Implantable Lead Implant Date: 20051123
Implantable Lead Implant Date: 20051123
Implantable Lead Location: 753859
Implantable Lead Location: 753860
Implantable Pulse Generator Implant Date: 20150219
Lead Channel Impedance Value: 460 Ohm
Lead Channel Impedance Value: 530 Ohm
Lead Channel Pacing Threshold Amplitude: 0.5 V
Lead Channel Pacing Threshold Amplitude: 1.125 V
Lead Channel Pacing Threshold Pulse Width: 0.5 ms
Lead Channel Pacing Threshold Pulse Width: 0.5 ms
Lead Channel Sensing Intrinsic Amplitude: 11 mV
Lead Channel Sensing Intrinsic Amplitude: 5 mV
Lead Channel Setting Pacing Amplitude: 1.375
Lead Channel Setting Pacing Amplitude: 2 V
Lead Channel Setting Pacing Pulse Width: 0.5 ms
Lead Channel Setting Sensing Sensitivity: 5 mV
Pulse Gen Model: 2240
Pulse Gen Serial Number: 7596156

## 2018-12-13 ENCOUNTER — Encounter: Payer: Self-pay | Admitting: Cardiology

## 2018-12-13 NOTE — Progress Notes (Signed)
Remote pacemaker transmission.   

## 2019-03-04 ENCOUNTER — Ambulatory Visit (INDEPENDENT_AMBULATORY_CARE_PROVIDER_SITE_OTHER): Payer: Medicare Other | Admitting: *Deleted

## 2019-03-04 DIAGNOSIS — I442 Atrioventricular block, complete: Secondary | ICD-10-CM | POA: Diagnosis not present

## 2019-03-05 LAB — CUP PACEART REMOTE DEVICE CHECK
Battery Remaining Longevity: 104 mo
Battery Remaining Percentage: 89 %
Battery Voltage: 2.95 V
Brady Statistic AP VP Percent: 18 %
Brady Statistic AP VS Percent: 1 %
Brady Statistic AS VP Percent: 82 %
Brady Statistic AS VS Percent: 1 %
Brady Statistic RA Percent Paced: 18 %
Brady Statistic RV Percent Paced: 99 %
Date Time Interrogation Session: 20200924060012
Implantable Lead Implant Date: 20051123
Implantable Lead Implant Date: 20051123
Implantable Lead Location: 753859
Implantable Lead Location: 753860
Implantable Pulse Generator Implant Date: 20150219
Lead Channel Impedance Value: 440 Ohm
Lead Channel Impedance Value: 560 Ohm
Lead Channel Pacing Threshold Amplitude: 0.5 V
Lead Channel Pacing Threshold Amplitude: 1 V
Lead Channel Pacing Threshold Pulse Width: 0.5 ms
Lead Channel Pacing Threshold Pulse Width: 0.5 ms
Lead Channel Sensing Intrinsic Amplitude: 11 mV
Lead Channel Sensing Intrinsic Amplitude: 5 mV
Lead Channel Setting Pacing Amplitude: 1.25 V
Lead Channel Setting Pacing Amplitude: 2 V
Lead Channel Setting Pacing Pulse Width: 0.5 ms
Lead Channel Setting Sensing Sensitivity: 5 mV
Pulse Gen Model: 2240
Pulse Gen Serial Number: 7596156

## 2019-03-10 ENCOUNTER — Other Ambulatory Visit: Payer: Self-pay | Admitting: Internal Medicine

## 2019-03-12 ENCOUNTER — Other Ambulatory Visit: Payer: Self-pay | Admitting: Internal Medicine

## 2019-03-12 ENCOUNTER — Encounter: Payer: Self-pay | Admitting: Cardiology

## 2019-03-12 NOTE — Progress Notes (Signed)
Remote pacemaker transmission.   

## 2019-04-01 NOTE — Progress Notes (Signed)
Electrophysiology Office Note Date: 04/02/2019  ID:  Donald Patel, DOB 23-Nov-1940, MRN [id number]  PCP: Hali Marry, MD Primary Cardiologist: [name], MD Electrophysiologist: Dr. Rayann Heman  CC: Pacemaker follow-up  Donald Patel is a 78 y.o. male seen today for Dr. Rayann Heman. He presents today for routine electrophysiology followup.  Since last being seen in our clinic, the patient reports doing very well. He is frustrated about his BP control. Runs [id number] systolic at home, but he only takes it when he feels run down. Was tried on lisinopril last year and felt "washed out" so switched back to HCTZ.   he denies chest pain, palpitations, dyspnea, PND, orthopnea, nausea, vomiting, dizziness, syncope, edema, weight gain, or early satiety.   Device History: St. Jude Dual Chamber PPM implanted 1998, gen change 04/2004 by Dr. Milta Deiters, gen change 07/2013 by Dr. Rayann Heman for CHB  Past Medical History:  Diagnosis Date  . AV BLOCK, COMPLETE    s/p PPM (SJM)  . Cataract both eyes corrected  . ERECTILE DYSFUNCTION, ORGANIC   . Hypertension   . Hypertension    Past Surgical History:  Procedure Laterality Date  . CATARACT EXTRACTION W/ INTRAOCULAR LENS IMPLANT  2013   right eye - [facility]  . CATARACT EXTRACTION W/ INTRAOCULAR LENS IMPLANT  2015   Left eye - [place]  . KNEE SURGERY    . PACEMAKER PLACEMENT  12/22/96   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, most recent generator (SJM) 05/02/04 both implanted by Dr Rebecka Apley [facility]  . PERMANENT PACEMAKER GENERATOR CHANGE N/A 07/29/2013   Procedure: PERMANENT PACEMAKER GENERATOR CHANGE;  Surgeon: Coralyn Mark, MD;  Location: [place] CATH LAB;  Service: Cardiovascular;  Laterality: N/A;  . VASECTOMY      Current Outpatient Medications  Medication Sig Dispense Refill  . Cholecalciferol (VITAMIN D3) 25 MCG/SPRAY LIQD Take by mouth.    . hydrochlorothiazide (HYDRODIURIL) 25 MG tablet Take 1 tablet (25 mg total) by mouth daily. Please  make overdue appt with Dr. Rayann Heman before anymore refills. 1st attempt 30 tablet 0  . ibuprofen (ADVIL,MOTRIN) 200 MG tablet Take 200 mg by mouth daily as needed (pain).    . Multiple Vitamins-Minerals (PRESERVISION AREDS 2 PO) Take by mouth daily.    . naproxen sodium (ANAPROX) 220 MG tablet Take 220 mg by mouth 2 (two) times daily as needed (pain).     No current facility-administered medications for this visit.     Allergies:   Patient has no known allergies.   Social History: Social History   Socioeconomic History  . Marital status: Married    Spouse name: Not on file  . Number of children: Not on file  . Years of education: Not on file  . Highest education level: Not on file  Occupational History  . Occupation: Retired  Scientific laboratory technician  . Financial resource strain: Not on file  . Food insecurity    Worry: Not on file    Inability: Not on file  . Transportation needs    Medical: Not on file    Non-medical: Not on file  Tobacco Use  . Smoking status: Former Smoker    Packs/day: 1.50    Years: 35.00    Pack years: 52.50    Types: Cigarettes, Pipe, Cigars    Quit date: 06/10/1985    Years since quitting: 33.8  . Smokeless tobacco: Never Used  Substance and Sexual Activity  . Alcohol use: No  . Drug use: No  .  Sexual activity: Never  Lifestyle  . Physical activity    Days per week: Not on file    Minutes per session: Not on file  . Stress: Not on file  Relationships  . Social Herbalist on phone: Not on file    Gets together: Not on file    Attends religious service: Not on file    Active member of club or organization: Not on file    Attends meetings of clubs or organizations: Not on file    Relationship status: Not on file  . Intimate partner violence    Fear of current or ex partner: Not on file    Emotionally abused: Not on file    Physically abused: Not on file    Forced sexual activity: Not on file  Other Topics Concern  . Not on file  Social  History Narrative   Recently retired,Former Furniture conservator/restorer.  Exercise.     Family History: Family History  Problem Relation Age of Onset  . Heart attack Brother   . Diabetes Unknown   . Hodgkin's lymphoma Brother     Review of Systems: All other systems reviewed and are otherwise negative except as noted above.  Physical Exam: Vitals:   04/02/19 1041  BP: (!) 160/82  Pulse: 60  Weight: 146 lb (66.2 kg)  Height: 5\' 10"  (1.778 m)     GEN- The patient is well appearing, alert and oriented x 3 today.   HEENT: normocephalic, atraumatic; sclera clear, conjunctiva pink; hearing intact; oropharynx clear; neck supple  Lungs- Clear to ausculation bilaterally, normal work of breathing.  No wheezes, rales, rhonchi Heart- Regular rate and rhythm, no murmurs, rubs or gallops  GI- soft, non-tender, non-distended, bowel sounds present  Extremities- no clubbing, cyanosis, or edema  MS- no significant deformity or atrophy Skin- warm and dry, no rash or lesion; PPM pocket well healed Psych- euthymic mood, full affect Neuro- strength and sensation are intact  PPM Interrogation- reviewed in detail today,  See PACEART report  EKG:  EKG is ordered today. The ekg ordered today shows AV pacing at 60 bpm  Recent Labs: No results found for requested labs within last 8760 hours.   Wt Readings from Last 3 Encounters:  04/02/19 146 lb (66.2 kg)  02/18/18 147 lb (66.7 kg)  01/29/18 148 lb (67.1 kg)     Other studies Reviewed: Additional studies/ records that were reviewed today include: Previous EP office notes, Previous PCP notes, Previous remote checks, Most recent labwork.   Assessment and Plan:  1.  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 HTN Continue HCTZ. He feels like this medication increases his urine output. He is hesitant to be on multiple medications for one problem, but I discussed the additive effect of  additional meds.  He is OK with trying Losartan 25 mg daily. It may take a week or so to bring his BP down. He will follow up with his PCP next month for further.   3. Paroxysmal atrial fibrillation Burden by device interrogation <1% Consider [place] if burden does increase 3 short episodes noted in September. < 30 seconds a piece.   Current medicines are reviewed at length with the patient today.   The patient has concerns regarding his medicines.  The following changes were made today: Start Losartan 25 mg daily. Labs at PCP follow up.   Disposition:   Follow  up with EP APP in 12 months. Continue q 3 month remotes.   Jacalyn Lefevre, PA-C  04/02/2019 11:11 AM  [facility] [address] [place] [address] [phone] (office) [phone] (fax)

## 2019-04-02 ENCOUNTER — Other Ambulatory Visit: Payer: Self-pay

## 2019-04-02 ENCOUNTER — Ambulatory Visit: Payer: Medicare Other | Admitting: Student

## 2019-04-02 VITALS — BP 160/82 | HR 60 | Ht 70.0 in | Wt 146.0 lb

## 2019-04-02 DIAGNOSIS — I10 Essential (primary) hypertension: Secondary | ICD-10-CM | POA: Diagnosis not present

## 2019-04-02 DIAGNOSIS — I480 Paroxysmal atrial fibrillation: Secondary | ICD-10-CM | POA: Diagnosis not present

## 2019-04-02 DIAGNOSIS — I442 Atrioventricular block, complete: Secondary | ICD-10-CM | POA: Diagnosis not present

## 2019-04-02 LAB — CUP PACEART INCLINIC DEVICE CHECK
Battery Remaining Longevity: 106 mo
Battery Voltage: 2.95 V
Brady Statistic RA Percent Paced: 18 %
Brady Statistic RV Percent Paced: 99.98 %
Date Time Interrogation Session: 20201023121240
Implantable Lead Implant Date: 20051123
Implantable Lead Implant Date: 20051123
Implantable Lead Location: 753859
Implantable Lead Location: 753860
Implantable Pulse Generator Implant Date: 20150219
Lead Channel Impedance Value: 450 Ohm
Lead Channel Impedance Value: 550 Ohm
Lead Channel Pacing Threshold Amplitude: 0.5 V
Lead Channel Pacing Threshold Amplitude: 0.5 V
Lead Channel Pacing Threshold Amplitude: 1.125 V
Lead Channel Pacing Threshold Pulse Width: 0.5 ms
Lead Channel Pacing Threshold Pulse Width: 0.5 ms
Lead Channel Pacing Threshold Pulse Width: 0.5 ms
Lead Channel Sensing Intrinsic Amplitude: 11 mV
Lead Channel Sensing Intrinsic Amplitude: 5 mV
Lead Channel Setting Pacing Amplitude: 1.375
Lead Channel Setting Pacing Amplitude: 2 V
Lead Channel Setting Pacing Pulse Width: 0.5 ms
Lead Channel Setting Sensing Sensitivity: 5 mV
Pulse Gen Model: 2240
Pulse Gen Serial Number: 7596156

## 2019-04-02 MED ORDER — LOSARTAN POTASSIUM 25 MG PO TABS: 25.0000 mg | ORAL_TABLET | Freq: Every day | ORAL | 3 refills | Status: DC

## 2019-04-02 NOTE — Patient Instructions (Signed)
Medication Instructions:   START TAKING: LOSARTAN 25 MG ONCE A DAY    *If you need a refill on your cardiac medications before your next appointment, please call your pharmacy*  Lab Work: NONE ORDERED  TODAY   If you have labs (blood work) drawn today and your tests are completely normal, you will receive your results only by: Marland Kitchen MyChart Message (if you have MyChart) OR . A paper copy in the mail If you have any lab test that is abnormal or we need to change your treatment, we will call you to review the results.  Testing/Procedures: NONE ORDERED  TODAY    Follow-Up: At [facility], you and your health needs are our priority.  As part of our continuing mission to provide you with exceptional heart care, we have created designated Provider Care Teams.  These Care Teams include your primary Cardiologist (physician) and Advanced Practice Providers (APPs -  Physician Assistants and Nurse Practitioners) who all work together to provide you with the care you need, when you need it.  Your next appointment:   12 months  The format for your next appointment:   In Person  Provider:    [name], NP  Tommye Standard, PA-C  Legrand Como "[place], [place]   Other Instructions

## 2019-04-05 ENCOUNTER — Encounter: Payer: Medicare Other | Admitting: Family Medicine

## 2019-04-07 ENCOUNTER — Other Ambulatory Visit: Payer: Self-pay | Admitting: Internal Medicine

## 2019-04-13 ENCOUNTER — Ambulatory Visit (INDEPENDENT_AMBULATORY_CARE_PROVIDER_SITE_OTHER): Payer: Medicare Other | Admitting: Family Medicine

## 2019-04-13 ENCOUNTER — Other Ambulatory Visit: Payer: Self-pay

## 2019-04-13 ENCOUNTER — Encounter: Payer: Self-pay | Admitting: Family Medicine

## 2019-04-13 VITALS — BP 142/61 | HR 60 | Ht 70.0 in | Wt 144.0 lb

## 2019-04-13 DIAGNOSIS — I10 Essential (primary) hypertension: Secondary | ICD-10-CM | POA: Diagnosis not present

## 2019-04-13 DIAGNOSIS — E559 Vitamin D deficiency, unspecified: Secondary | ICD-10-CM

## 2019-04-13 DIAGNOSIS — M25551 Pain in right hip: Secondary | ICD-10-CM

## 2019-04-13 DIAGNOSIS — Z0000 Encounter for general adult medical examination without abnormal findings: Secondary | ICD-10-CM | POA: Diagnosis not present

## 2019-04-13 MED ORDER — AMBULATORY NON FORMULARY MEDICATION: 0 refills | Status: DC

## 2019-04-13 NOTE — Progress Notes (Addendum)
Established Patient Office Visit  Subjective:  Patient ID: Donald Patel, male    DOB: 14-May-1941  Age: 78 y.o. MRN: [id number]  CC:  Chief Complaint  Patient presents with  . Annual Exam    regular CPE he has [employer] primary.     HPI Donald Patel presents for a complete physical exam and he did have a couple things that he wanted to address today.  He did see Dr. Jackalyn Lombard PA, cardiology and had a recent pacer check everything looked good.  He wanted let me know that he recently restarted some apple cider vinegar which she was taking to help with his blood pressure as well.  His pressures have been running a little bit higher and in fact he was encouraged to go ahead and start losartan in addition to his hydrochlorothiazide but because of a bad experience with lisinopril he was very hesitant to start it and wanted to discuss that today.  He reports that he is does exercise some but is often limited because of his right leg and hip pain.  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 He is now having pain over the outside of the right hip and radiates down the leg.  He would like to consult with an orthopaedist.    Quit smoking in 1989.  Does drink 2 beers per day.    Past Medical History:  Diagnosis Date  . AV BLOCK, COMPLETE    s/p PPM (SJM)  . Cataract both eyes corrected  . ERECTILE DYSFUNCTION, ORGANIC   . Hypertension   . Hypertension     Past Surgical History:  Procedure Laterality Date  . CATARACT EXTRACTION W/ INTRAOCULAR LENS IMPLANT  2013   right eye - [facility]  . CATARACT EXTRACTION W/ INTRAOCULAR LENS IMPLANT  2015   Left eye - [place]  . KNEE SURGERY    . PACEMAKER PLACEMENT  12/22/96   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, most recent generator (SJM) 05/02/04 both implanted by Dr Rebecka Apley [facility]  . PERMANENT PACEMAKER GENERATOR CHANGE N/A 07/29/2013   Procedure:  PERMANENT PACEMAKER GENERATOR CHANGE;  Surgeon: Coralyn Mark, MD;  Location: [facility]ATH [facility];  Service: Cardiovascular;  Laterality: N/A;  . VASECTOMY      Family History  Problem Relation Age of Onset  . Heart attack Brother   . Diabetes Unknown   . Hodgkin's lymphoma Brother     Social History   Socioeconomic History  . Marital status: Married    Spouse name: Not on file  . Number of children: Not on file  . Years of education: Not on file  . Highest education level: Not on file  Occupational History  . Occupation: Retired  Scientific laboratory technician  . Financial resource strain: Not on file  . Food insecurity    Worry: Not on file    Inability: Not on file  . Transportation needs    Medical: Not on file    Non-medical: Not on file  Tobacco Use  . Smoking status: Former Smoker    Packs/day: 1.50    Years: 35.00    Pack years: 52.50    Types: Cigarettes, Pipe, Cigars    Quit date: 06/10/1985    Years since quitting: 33.8  . Smokeless tobacco: Never Used  Substance and Sexual Activity  . Alcohol use: Yes    Alcohol/week: 14.0  standard drinks    Types: 14 Cans of beer per week  . Drug use: No  . Sexual activity: Never  Lifestyle  . Physical activity    Days per week: Not on file    Minutes per session: Not on file  . Stress: Not on file  Relationships  . Social Herbalist on phone: Not on file    Gets together: Not on file    Attends religious service: Not on file    Active member of club or organization: Not on file    Attends meetings of clubs or organizations: Not on file    Relationship status: Not on file  . Intimate partner violence    Fear of current or ex partner: Not on file    Emotionally abused: Not on file    Physically abused: Not on file    Forced sexual activity: Not on file  Other Topics Concern  . Not on file  Social History Narrative   Recently retired,Former Furniture conservator/restorer.  Exercise.     Outpatient Medications Prior to Visit  Medication Sig  Dispense Refill  . acetaminophen (TYLENOL) 325 MG tablet Take 650 mg by mouth as needed.    . Cholecalciferol (VITAMIN D3) 25 MCG/SPRAY LIQD Take by mouth.    . hydrochlorothiazide (HYDRODIURIL) 25 MG tablet TAKE 1 TABLET BY MOUTH EVERY DAY. 90 tablet 3  . ibuprofen (ADVIL,MOTRIN) 200 MG tablet Take 200 mg by mouth daily as needed (pain).    Marland Kitchen losartan (COZAAR) 25 MG tablet Take 1 tablet (25 mg total) by mouth daily. (Patient not taking: Reported on 04/13/2019) 90 tablet 3  . Multiple Vitamins-Minerals (PRESERVISION AREDS 2 PO) Take by mouth daily.    . naproxen sodium (ANAPROX) 220 MG tablet Take 220 mg by mouth 2 (two) times daily as needed (pain).     No facility-administered medications prior to visit.     Allergies  Allergen Reactions  . Lisinopril Other (See Comments)    ROS Review of Systems    Objective:    Physical Exam  Constitutional: He is oriented to person, place, and time. He appears well-developed and well-nourished.  HENT:  Head: Normocephalic and atraumatic.  Right Ear: External ear normal.  Left Ear: External ear normal.  Nose: Nose normal.  Mouth/Throat: Oropharynx is clear and moist.  Eyes: Pupils are equal, round, and reactive to light. Conjunctivae and EOM are normal.  Neck: Normal range of motion. Neck supple. No thyromegaly present.  Cardiovascular: Normal rate, regular rhythm, normal heart sounds and intact distal pulses.  Pulmonary/Chest: Effort normal and breath sounds normal.  Abdominal: Soft. Bowel sounds are normal. He exhibits no distension and no mass. There is no abdominal tenderness. There is no rebound and no guarding.  Musculoskeletal: Normal range of motion.  Lymphadenopathy:    He has no cervical adenopathy.  Neurological: He is alert and oriented to person, place, and time. He has normal reflexes.  Skin: Skin is warm and dry.  Psychiatric: He has a normal mood and affect. His behavior is normal. Judgment and thought content normal.     BP (!) 142/61   Pulse 60   Ht 5\' 10"  (1.778 m)   Wt 144 lb (65.3 kg)   SpO2 99%   BMI 20.66 kg/m  Wt Readings from Last 3 Encounters:  04/13/19 144 lb (65.3 kg)  04/02/19 146 lb (66.2 kg)  02/18/18 147 lb (66.7 kg)     There are no preventive care reminders  to display for this patient.  There are no preventive care reminders to display for this patient.  Lab Results  Component Value Date   TSH 1.40 04/13/2019   Lab Results  Component Value Date   WBC 6.9 01/29/2018   HGB 16.2 01/29/2018   HCT 45.1 01/29/2018   MCV 91.1 01/29/2018   PLT 216 01/29/2018   Lab Results  Component Value Date   NA 140 04/13/2019   K 3.6 04/13/2019   CO2 32 04/13/2019   GLUCOSE 100 (H) 04/13/2019   BUN 12 04/13/2019   CREATININE 0.92 04/13/2019   BILITOT 0.9 04/13/2019   ALKPHOS 73 11/05/2016   AST 16 04/13/2019   ALT 12 04/13/2019   PROT 7.3 04/13/2019   ALBUMIN 4.2 11/05/2016   CALCIUM 9.9 04/13/2019   GFR 83.49 07/28/2013   Lab Results  Component Value Date   CHOL 188 04/13/2019   Lab Results  Component Value Date   HDL 66 04/13/2019   Lab Results  Component Value Date   LDLCALC 107 (H) 04/13/2019   Lab Results  Component Value Date   TRIG 63 04/13/2019   Lab Results  Component Value Date   CHOLHDL 2.8 04/13/2019   Lab Results  Component Value Date   HGBA1C 5.2 05/07/2016      Assessment & Plan:   Problem List Items Addressed This Visit      Cardiovascular and Mediastinum   Essential hypertension, benign    Discussed taking the losartan and importance of controlling his BP. I encouraged him to try the medication.  Discussed side effects. Call in 2 weeks if not able to take it.  If able to take it then can change to 90 days.         Other   Vitamin D deficiency    Due to recheck Vitamin D      Relevant Orders   VITAMIN D 25 Hydroxy (Vit-D Deficiency, Fractures) (Completed)    Other Visit Diagnoses    Wellness examination    -  Primary    Uncontrolled hypertension       Relevant Orders   COMPLETE METABOLIC PANEL WITH GFR (Completed)   Lipid panel (Completed)   VITAMIN D 25 Hydroxy (Vit-D Deficiency, Fractures) (Completed)   TSH (Completed)   Right hip pain       Relevant Orders   Ambulatory referral to Orthopedic Surgery     Right hip pain - refer to orthopedics for further evaluation but it sounds like it is likely bursitis.  But he would prefer to see Ortho for consultation.  Wellness Visit - Keep up a regular exercise program and make sure you are eating a healthy diet Try to eat 4 servings of dairy a day, or if you are lactose intolerant take a calcium with vitamin D daily.  Vaccines are up to date.    Meds ordered this encounter  Medications  . AMBULATORY NON FORMULARY MEDICATION    Sig: Medication Name: Tdap x1 IM    Dispense:  1 vial    Refill:  0    Follow-up: Return in about 4 weeks (around 05/11/2019) for Hypertension.    Beatrice Lecher, MD

## 2019-04-13 NOTE — Progress Notes (Signed)
Pt brought in home BP readings. He said that his readings have been in the 160's and 170's. He stated that when he was seen at Dr. Jackalyn Lombard office they wanted to start him on Losartan 25 mg in addition to the HCTZ 25 mg. He said that he did not want to take both of these medications together because he had experienced bad side effects (dizzy, didn't feel right) from a previous BP medication that he was given (Lisinopril 20 mg).   Maryruth Eve, [place]ahoma Crocker, CMA

## 2019-04-14 ENCOUNTER — Encounter: Payer: Self-pay | Admitting: Family Medicine

## 2019-04-14 DIAGNOSIS — E559 Vitamin D deficiency, unspecified: Secondary | ICD-10-CM | POA: Insufficient documentation

## 2019-04-14 LAB — COMPLETE METABOLIC PANEL WITH GFR
AG Ratio: 1.5 (calc) (ref 1.0–2.5)
ALT: 12 U/L (ref 9–46)
AST: 16 U/L (ref 10–35)
Albumin: 4.4 g/dL (ref 3.6–5.1)
Alkaline phosphatase (APISO): 66 U/L (ref 35–144)
BUN: 12 mg/dL (ref 7–25)
CO2: 32 mmol/L (ref 20–32)
Calcium: 9.9 mg/dL (ref 8.6–10.3)
Chloride: 99 mmol/L (ref 98–110)
Creat: 0.92 mg/dL (ref 0.70–1.18)
GFR, Est African American: 91 mL/min/{1.73_m2} (ref >=60)
GFR, Est Non African American: 79 mL/min/{1.73_m2} (ref >=60)
Globulin: 2.9 g/dL (calc) (ref 1.9–3.7)
Glucose, Bld: 100 mg/dL — ABNORMAL HIGH (ref 65–99)
Potassium: 3.6 mmol/L (ref 3.5–5.3)
Sodium: 140 mmol/L (ref 135–146)
Total Bilirubin: 0.9 mg/dL (ref 0.2–1.2)
Total Protein: 7.3 g/dL (ref 6.1–8.1)

## 2019-04-14 LAB — TSH: TSH: 1.4 mIU/L (ref 0.40–4.50)

## 2019-04-14 LAB — VITAMIN D 25 HYDROXY (VIT D DEFICIENCY, FRACTURES): Vit D, 25-Hydroxy: 39 ng/mL (ref 30–100)

## 2019-04-14 LAB — LIPID PANEL
Cholesterol: 188 mg/dL (ref <200)
HDL: 66 mg/dL (ref >=40)
LDL Cholesterol (Calc): 107 mg/dL (calc) — ABNORMAL HIGH
Non-HDL Cholesterol (Calc): 122 mg/dL (calc) (ref <130)
Total CHOL/HDL Ratio: 2.8 (calc) (ref <5.0)
Triglycerides: 63 mg/dL (ref <150)

## 2019-04-14 NOTE — Assessment & Plan Note (Signed)
Due to recheck Vitamin D

## 2019-04-14 NOTE — Assessment & Plan Note (Addendum)
Discussed taking the losartan and importance of controlling his BP. I encouraged him to try the medication.  Discussed side effects. Call in 2 weeks if not able to take it.  If able to take it then can change to 90 days.

## 2019-04-22 ENCOUNTER — Ambulatory Visit: Payer: Medicare Other | Admitting: Orthopaedic Surgery

## 2019-04-29 ENCOUNTER — Other Ambulatory Visit: Payer: Self-pay

## 2019-04-29 ENCOUNTER — Ambulatory Visit: Payer: Medicare Other | Admitting: Orthopaedic Surgery

## 2019-04-29 ENCOUNTER — Encounter: Payer: Self-pay | Admitting: Orthopaedic Surgery

## 2019-04-29 ENCOUNTER — Ambulatory Visit: Payer: Self-pay

## 2019-04-29 VITALS — Ht 70.0 in | Wt 140.0 lb

## 2019-04-29 DIAGNOSIS — M545 Low back pain: Secondary | ICD-10-CM | POA: Diagnosis not present

## 2019-04-29 DIAGNOSIS — G8929 Other chronic pain: Secondary | ICD-10-CM | POA: Diagnosis not present

## 2019-04-29 NOTE — Progress Notes (Signed)
Office Visit Note   Patient: Donald Patel           Date of Birth: December 24, 1940           MRN: [id number] Visit Date: 04/29/2019              Requested by: Hali Marry, [place] [address] [address] [place],  [place] [address] PCP: Hali Marry, MD   Assessment & Plan: Visit Diagnoses:  1. Chronic right-sided low back pain without sciatica     Plan: Impression is lumbar spondylosis and low back pain.  Based on discussion patient will continue to take Tylenol as needed.  Reassurance was provided that x-rays are consistent with age-related degeneration.  Patient is agreeable to outpatient physical therapy.  Questions encouraged and answered.  Follow-up as needed.  Follow-Up Instructions: Return if symptoms worsen or fail to improve.   Orders:  Orders Placed This Encounter  Procedures  . XR Lumbar Spine 2-3 Views  . Ambulatory referral to Physical Therapy   No orders of the defined types were placed in this encounter.     Procedures: No procedures performed   Clinical Data: No additional findings.   Subjective: Chief Complaint  Patient presents with  . Right Leg - Pain    Donald Patel is a very pleasant 78 year old gentleman comes in for evaluation of right-sided lower back pain and leg pain.  This is most symptomatic at night.  He states that he feels some weakness when he walks 9 holes of golf.  He denies any groin pain.  Denies any numbness and tingling.  He takes Tylenol as needed.  Mainly he wants reassurance to make sure everything looks okay.  Denies any constitutional symptoms.   Review of Systems  Constitutional: Negative.   All other systems reviewed and are negative.    Objective: Vital Signs: Ht 5\' 10"  (1.778 m)   Wt 140 lb (63.5 kg)   BMI 20.09 kg/m   Physical Exam Vitals signs and nursing note reviewed.  Constitutional:      Appearance: He is well-developed.  HENT:     Head: Normocephalic and atraumatic.  Eyes:   Pupils: Pupils are equal, round, and reactive to light.  Neck:     Musculoskeletal: Neck supple.  Pulmonary:     Effort: Pulmonary effort is normal.  Abdominal:     Palpations: Abdomen is soft.  Musculoskeletal: Normal range of motion.  Skin:    General: Skin is warm.  Neurological:     Mental Status: He is alert and oriented to person, place, and time.  Psychiatric:        Behavior: Behavior normal.        Thought Content: Thought content normal.        Judgment: Judgment normal.     Ortho Exam Lumbar spine exam shows no tenderness palpation.  Right hip exam shows no tenderness palpation.  Full range of motion of the hip.  No sciatic tension signs. Specialty Comments:  No specialty comments available.  Imaging: Xr Lumbar Spine 2-3 Views  Result Date: 04/29/2019 No acute abnormalities.  Lumbar spondylosis.    PMFS History: Patient Active Problem List   Diagnosis Date Noted  . Vitamin D deficiency 04/14/2019  . Back pain 01/08/2018  . Stool incontinence 11/13/2016  . Other and combined forms of senile cataract 07/01/2013  . 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 AV BLOCK, COMPLETE 07/20/2008  . Essential hypertension, benign 02/25/2008  . 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 AV BLOCK, COMPLETE    s/p PPM (SJM)  . Cataract both eyes corrected  . ERECTILE DYSFUNCTION, ORGANIC   . Hypertension   . Hypertension     Family History  Problem Relation Age of Onset  . Heart attack Brother   . Diabetes Other   . Hodgkin's lymphoma Brother     Past Surgical History:  Procedure Laterality Date  . CATARACT EXTRACTION W/ INTRAOCULAR LENS IMPLANT  2013   right eye - [facility]  . CATARACT EXTRACTION W/ INTRAOCULAR LENS IMPLANT  2015   Left eye - [place]  . KNEE SURGERY    . 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 PERMANENT  PACEMAKER GENERATOR CHANGE N/A 07/29/2013   Procedure: PERMANENT PACEMAKER GENERATOR CHANGE;  Surgeon: Coralyn Mark, MD;  Location: [facility]ATH [facility];  Service: Cardiovascular;  Laterality: N/A;  . VASECTOMY     Social History   Occupational History  . Occupation: Retired  Tobacco Use  . Smoking status: Former Smoker    Packs/day: 1.50    Years: 35.00    Pack years: 52.50    Types: Cigarettes, Pipe, Cigars    Quit date: 06/10/1985    Years since quitting: 33.9  . Smokeless tobacco: Never Used  Substance and Sexual Activity  . Alcohol use: Yes    Alcohol/week: 14.0 standard drinks    Types: 14 Cans of beer per week  . Drug use: No  . Sexual activity: Never

## 2019-05-11 ENCOUNTER — Ambulatory Visit (INDEPENDENT_AMBULATORY_CARE_PROVIDER_SITE_OTHER): Payer: Medicare Other

## 2019-05-11 ENCOUNTER — Encounter: Payer: Self-pay | Admitting: Family Medicine

## 2019-05-11 ENCOUNTER — Ambulatory Visit (INDEPENDENT_AMBULATORY_CARE_PROVIDER_SITE_OTHER): Payer: Medicare Other | Admitting: Family Medicine

## 2019-05-11 ENCOUNTER — Other Ambulatory Visit: Payer: Self-pay

## 2019-05-11 VITALS — BP 137/47 | HR 65 | Ht 70.0 in | Wt 149.0 lb

## 2019-05-11 DIAGNOSIS — M25461 Effusion, right knee: Secondary | ICD-10-CM | POA: Diagnosis not present

## 2019-05-11 DIAGNOSIS — I10 Essential (primary) hypertension: Secondary | ICD-10-CM

## 2019-05-11 DIAGNOSIS — M2391 Unspecified internal derangement of right knee: Secondary | ICD-10-CM

## 2019-05-11 DIAGNOSIS — M1711 Unilateral primary osteoarthritis, right knee: Secondary | ICD-10-CM | POA: Diagnosis not present

## 2019-05-11 NOTE — Addendum Note (Signed)
Addended by: Beatrice Lecher D on: 05/11/2019 05:02 PM   Modules accepted: Orders

## 2019-05-11 NOTE — Progress Notes (Signed)
Established Patient Office Visit  Subjective:  Patient ID: Donald Patel, male    DOB: 1941-01-17  Age: 78 y.o. MRN: [id number]  CC:  Chief Complaint  Patient presents with  . Hypertension    he reports that he is doing well on Losartan 50 mg. he also brought in his BP log for review    HPI [name] presents for Hypertension-here for follow-up.  He was seen about 4 weeks ago and at that time blood pressure was mildly elevated. He recently started the losartan.  pt denies chest pain, SOB, dizziness, or heart palpitations.  Taking meds as directed w/o problems.  Denies medication side effects.  Blood pressure log looks fantastic.  Most of the blood pressures are under 140.  Average pressure over the last 4 weeks is 131/75.  With a pulse of 68.  He also wanted to let me know he was a little bit disappointed in his visit with the orthopedist.  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  Most of his back pain seems to be worse at night when he lays down he is usually pretty functional during the daytime.  But he is also had a long history of right knee problems please see prior notes.  He says if he walks a lot it will start to lock up.  He really wants to be able to play golf again and says he cannot because of the knee.  They did do lumbar spine x-rays and have referred him for physical therapy which she is starting tomorrow. 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     Past Medical History:  Diagnosis Date  . AV BLOCK, COMPLETE    s/p PPM (SJM)  . Cataract both eyes corrected  . ERECTILE DYSFUNCTION, ORGANIC   . Hypertension   . Hypertension     Past Surgical History:  Procedure Laterality Date  . CATARACT EXTRACTION W/ INTRAOCULAR LENS IMPLANT  2013   right eye - [facility]  . CATARACT  EXTRACTION W/ INTRAOCULAR LENS IMPLANT  2015   Left eye - [place]  . KNEE SURGERY    . 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 PERMANENT PACEMAKER GENERATOR CHANGE N/A 07/29/2013   Procedure: PERMANENT PACEMAKER GENERATOR CHANGE;  Surgeon: Coralyn Mark, MD;  Location: [facility] [facility]ATH [facility];  Service: Cardiovascular;  Laterality: N/A;  . VASECTOMY      Family History  Problem Relation Age of Onset  . Heart attack Brother   . Diabetes Other   . Hodgkin's lymphoma Brother     Social History   Socioeconomic History  . Marital status: Married    Spouse name: Not on file  . Number of children: Not on file  . Years of education: Not on file  . Highest education level: Not on file  Occupational History  . Occupation: Retired  Scientific laboratory technician  . Financial resource strain: Not on file  . Food insecurity    Worry: Not on file    Inability: Not on file  . Transportation needs    Medical: Not on file    Non-medical: Not on file  Tobacco Use  . Smoking status: Former Smoker  Packs/day: 1.50    Years: 35.00    Pack years: 52.50    Types: Cigarettes, Pipe, Cigars    Quit date: 06/10/1985    Years since quitting: 33.9  . Smokeless tobacco: Never Used  Substance and Sexual Activity  . Alcohol use: Yes    Alcohol/week: 14.0 standard drinks    Types: 14 Cans of beer per week  . Drug use: No  . Sexual activity: Never  Lifestyle  . Physical activity    Days per week: Not on file    Minutes per session: Not on file  . Stress: Not on file  Relationships  . Social Herbalist on phone: Not on file    Gets together: Not on file    Attends religious service: Not on file    Active member of club or organization: Not on file    Attends meetings of clubs or organizations: Not on file    Relationship status: Not on file  . Intimate partner violence    Fear of current or ex partner:  Not on file    Emotionally abused: Not on file    Physically abused: Not on file    Forced sexual activity: Not on file  Other Topics Concern  . Not on file  Social History Narrative   Recently retired,Former Furniture conservator/restorer.  Exercise.     Outpatient Medications Prior to Visit  Medication Sig Dispense Refill  . acetaminophen (TYLENOL) 325 MG tablet Take 650 mg by mouth as needed.    . AMBULATORY NON FORMULARY MEDICATION Medication Name: Tdap x1 IM 1 vial 0  . Cholecalciferol (VITAMIN D3) 25 MCG/SPRAY LIQD Take by mouth.    . hydrochlorothiazide (HYDRODIURIL) 25 MG tablet TAKE 1 TABLET BY MOUTH EVERY DAY. 90 tablet 3  . losartan (COZAAR) 25 MG tablet Take 1 tablet (25 mg total) by mouth daily. (Patient taking differently: Take 50 mg by mouth daily. ) 90 tablet 3  . Multiple Vitamins-Minerals (PRESERVISION AREDS 2 PO) Take by mouth daily.    Marland Kitchen ibuprofen (ADVIL,MOTRIN) 200 MG tablet Take 200 mg by mouth daily as needed (pain).     No facility-administered medications prior to visit.     Allergies  Allergen Reactions  . Lisinopril Other (See Comments)    ROS Review of Systems    Objective:    Physical Exam  Constitutional: He is oriented to person, place, and time. He appears well-developed and well-nourished.  HENT:  Head: Normocephalic and atraumatic.  Cardiovascular: Normal rate, regular rhythm and normal heart sounds.  Pulmonary/Chest: Effort normal and breath sounds normal.  Neurological: He is alert and oriented to person, place, and time.  Skin: Skin is warm and dry.  Psychiatric: He has a normal mood and affect. His behavior is normal.    BP (!) 137/47   Pulse 65   Ht 5\' 10"  (1.778 m)   Wt 149 lb (67.6 kg)   SpO2 100%   BMI 21.38 kg/m  Wt Readings from Last 3 Encounters:  05/11/19 149 lb (67.6 kg)  04/29/19 140 lb (63.5 kg)  04/13/19 144 lb (65.3 kg)     There are no preventive care reminders to display for this patient.  There are no preventive care  reminders to display for this patient.  Lab Results  Component Value Date   TSH 1.40 04/13/2019   Lab Results  Component Value Date   WBC 6.9 01/29/2018   HGB 16.2 01/29/2018   HCT 45.1 01/29/2018  MCV 91.1 01/29/2018   PLT 216 01/29/2018   Lab Results  Component Value Date   NA 140 04/13/2019   K 3.6 04/13/2019   CO2 32 04/13/2019   GLUCOSE 100 (H) 04/13/2019   BUN 12 04/13/2019   CREATININE 0.92 04/13/2019   BILITOT 0.9 04/13/2019   ALKPHOS 73 11/05/2016   AST 16 04/13/2019   ALT 12 04/13/2019   PROT 7.3 04/13/2019   ALBUMIN 4.2 11/05/2016   CALCIUM 9.9 04/13/2019   GFR 83.49 07/28/2013   Lab Results  Component Value Date   CHOL 188 04/13/2019   Lab Results  Component Value Date   HDL 66 04/13/2019   Lab Results  Component Value Date   LDLCALC 107 (H) 04/13/2019   Lab Results  Component Value Date   TRIG 63 04/13/2019   Lab Results  Component Value Date   CHOLHDL 2.8 04/13/2019   Lab Results  Component Value Date   HGBA1C 5.2 05/07/2016      Assessment & Plan:   Problem List Items Addressed This Visit      Cardiovascular and Mediastinum   Essential hypertension, benign - Primary    Pressures look fantastic and he feels good on his new medication we will continue with current regimen and make sure that he has refills and plan to follow-up in 6 months.        Other   Knee locking, right    I do think some of his leg pain is coming from his spine as it tends to bother him at night, when his back starts to hurt but then he also gets locking with extended walking that is been going on for years which I think is a separate issue.  Sounds concerning for significant OA versus loose body in the joint.  We will start with plain x-ray today.  We can also have them work on his knee and physical therapy as well.      Relevant Orders   DG Knee Complete 4 Views Right     Note: Diastolic pressure is a little bit low today.  I did encourage him to make  sure he is hydrating well.  He had not eaten yet before his appointment.  No orders of the defined types were placed in this encounter.   Follow-up: Return in about 6 months (around 11/09/2019) for Hypertension.    Beatrice Lecher, MD

## 2019-05-11 NOTE — Assessment & Plan Note (Signed)
I do think some of his leg pain is coming from his spine as it tends to bother him at night, when his back starts to hurt but then he also gets locking with extended walking that is been going on for years which I think is a separate issue.  Sounds concerning for significant OA versus loose body in the joint.  We will start with plain x-ray today.  We can also have them work on his knee and physical therapy as well.

## 2019-05-11 NOTE — Assessment & Plan Note (Signed)
Pressures look fantastic and he feels good on his new medication we will continue with current regimen and make sure that he has refills and plan to follow-up in 6 months.

## 2019-05-12 ENCOUNTER — Ambulatory Visit (INDEPENDENT_AMBULATORY_CARE_PROVIDER_SITE_OTHER): Payer: Medicare Other | Admitting: Physical Therapy

## 2019-05-12 ENCOUNTER — Encounter: Payer: Self-pay | Admitting: Physical Therapy

## 2019-05-12 DIAGNOSIS — M6281 Muscle weakness (generalized): Secondary | ICD-10-CM

## 2019-05-12 DIAGNOSIS — M5441 Lumbago with sciatica, right side: Secondary | ICD-10-CM

## 2019-05-12 DIAGNOSIS — G8929 Other chronic pain: Secondary | ICD-10-CM | POA: Diagnosis not present

## 2019-05-12 NOTE — Patient Instructions (Signed)
Access Code: [email or web address]  URL: [email or web address]  Date: 05/12/2019  Prepared by: Madelyn Flavors   Exercises Seated Hamstring Stretch with Chair - 3 reps - 1 sets - 30-60 sec hold - 2x daily - 7x weekly Seated Figure 4 Piriformis Stretch - 3 reps - 1 sets - 30-60 sec hold - 2x daily - 7x weekly Standing Quadratus Lumborum Stretch with Doorway - 2 reps - 1 sets - 60 sec hold - 1x daily - 7x weekly

## 2019-05-12 NOTE — Therapy (Addendum)
[place], [place], [address] Phone: [phone]   Fax:  [phone]  Physical Therapy Evaluation and Discharge Summary  Patient Details  Name: Donald Patel MRN: [id number] Date of Birth: [birth year] Referring Provider (PT): Frankey Shown   Encounter Date: 05/12/2019  PT End of Session - 05/12/19 1436    Visit Number  1    Number of Visits  12    Date for PT Re-Evaluation  06/23/19    PT Start Time  7939    PT Stop Time  1518    PT Time Calculation (min)  41 min    Activity Tolerance  Patient tolerated treatment well    Behavior During Therapy  [facility] for tasks assessed/performed       Past Medical History:  Diagnosis Date  . AV BLOCK, COMPLETE    s/p PPM (SJM)  . Cataract both eyes corrected  . ERECTILE DYSFUNCTION, ORGANIC   . Hypertension   . Hypertension     Past Surgical History:  Procedure Laterality Date  . CATARACT EXTRACTION W/ INTRAOCULAR LENS IMPLANT  2013   right eye - [facility]  . CATARACT EXTRACTION W/ INTRAOCULAR LENS IMPLANT  2015   Left eye - [place]  . KNEE SURGERY    . 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 PERMANENT PACEMAKER GENERATOR CHANGE N/A 07/29/2013   Procedure: PERMANENT PACEMAKER GENERATOR CHANGE;  Surgeon: Coralyn Mark, MD;  Location: [place] CATH [facility];  Service: Cardiovascular;  Laterality: N/A;  . VASECTOMY      There were no vitals filed for this visit.   Subjective Assessment - 05/12/19 1440    Subjective  Patient's main complaint is low back pain at night and his right leg will ache. He is having difficulty walking while playing 9 holes of golf. He can make it about half way through. Patient says knee aches so bad he has to stop.    Pertinent History  lumbar spondylosis, right knee surger when 14 to remove some "cartilage", HTN, PACEMAKER    How long  can you walk comfortably?  not limited on level ground    Diagnostic tests  xrays    Patient Stated Goals  be able to walk and play golf    Currently in Pain?  Yes    Pain Score  2     Pain Location  Leg    Pain Orientation  Right    Pain Descriptors / Indicators  Aching    Pain Type  Chronic pain    Pain Radiating Towards  right buttock to post knee    Pain Onset  More than a month ago    Pain Frequency  Constant    Aggravating Factors   sitting, walking on uneven ground    Pain Relieving Factors  OTC meds    Effect of Pain on Daily Activities  can't play golf         [facility] PT Assessment - 05/12/19 0001      Assessment   Medical Diagnosis  chronic LBP with sciatica    Referring Provider (PT)  Naiping Xu    Onset Date/Surgical Date  04/11/19      Precautions   Precautions  ICD/Pacemaker      Balance Screen   Has the patient fallen in the past 6 months  No    Has  the patient had a decrease in activity level because of a fear of falling?   No    Is the patient reluctant to leave their home because of a fear of falling?   No      Home Film/video editor residence    Additional Comments  2 story no problem with stairs      Prior Function   Level of Independence  Independent    Vocation  Retired    Museum/gallery conservator Comments  depressed right shoulder and scapula, even pelvis, tight QL with left lateral shift      ROM / Strength   AROM / PROM / Strength  AROM;Strength      AROM   Overall AROM Comments  Lumbar WFL all planes; Rt knee 0-134 flexion    AROM Assessment Site  --      Strength   Overall Strength Comments  Bil hip flex 4-/5 with poor lumbar stabilization; bil HS 4+/5, Rt hip ext 4-/5, Lt hip ext 4/5, ABD 5/5      Flexibility   Soft Tissue Assessment /Muscle Length  yes    Hamstrings  marked tightness    Quadriceps  marked tightness    Piriformis  marked tightness    Quadratus Lumborum  right  tightness      Palpation   Spinal mobility  decreased PA mobility bil Rt>Lt    Palpation comment  right HS and piriformis                Objective measurements completed on examination: See above findings.                   PT Long Term Goals - 05/12/19 1731      PT LONG TERM GOAL #1   Title  Ind with HEP for flexibility and strengthening.    Time  6    Period  Weeks    Status  New    Target Date  06/23/19      PT LONG TERM GOAL #2   Title  Patient to report decreased right leg pain by 50% or more allowing him to walk nine holes of golf.    Time  6    Period  Weeks    Status  New      PT LONG TERM GOAL #3   Title  Patient able to sleep without waking from leg pain.    Time  6    Period  Weeks    Status  New      PT LONG TERM GOAL #4   Title  Improved BLE strength to 4+/5 or better to improve function.    Time  6    Period  Weeks    Status  New             Plan - 05/12/19 1520    Clinical Impression Statement  Patient presents with c/o of low back pain with sciatica into RLE mainly in his knee. His pain is constant but is limiting mainly with sleeping and playing golf. He reports he can not finish walking 9 holes of golf due to leg pain. He can walk unlimited on even ground. Patient has marked tightness in his right QL, bil piriformis, quads and hamstrings likely affecting his pain. His lumbar ROM is WFL but he has weakness in lumbar stabilizers. His right knee ROM and strength  is [facility]. Patient will benefit from PT to decrease his pain and increase his function so he can sleep and walk on uneven ground.    Personal Factors and Comorbidities  Comorbidity 3+;Age    Comorbidities  lumbar spondylosis, right knee surger when 14 to remove some "cartilage", HTN, PACEMAKER    Examination-Activity Limitations  Sleep    Stability/Clinical Decision Making  Stable/Uncomplicated    Clinical Decision Making  Low    Rehab Potential  Good    PT Frequency   2x / week    PT Duration  6 weeks    PT Treatment/Interventions  ADLs/Self Care Home Management;Cryotherapy;Moist Heat;Traction;Therapeutic exercise;Neuromuscular re-education;Patient/family education;Dry needling;Manual techniques;Taping    PT Next Visit Plan  Flexibility BLE, Release right QL, lumbar stab, possible trial of traction. NO estim/ionto due to pacemaker    PT Home Exercise Plan  [id number]    Consulted and Agree with Plan of Care  Patient       Patient will benefit from skilled therapeutic intervention in order to improve the following deficits and impairments:  Decreased range of motion, Pain, Postural dysfunction, Decreased strength, Impaired flexibility, Decreased activity tolerance  Visit Diagnosis: Chronic right-sided low back pain with right-sided sciatica - Plan: PT plan of care cert/re-cert  Muscle weakness (generalized) - Plan: PT plan of care cert/re-cert     Problem List Patient Active Problem List   Diagnosis Date Noted  . Knee locking, right 05/11/2019  . Vitamin D deficiency 04/14/2019  . Back pain 01/08/2018  . Stool incontinence 11/13/2016  . Other and combined forms of senile cataract 07/01/2013  . 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 AV BLOCK, COMPLETE 07/20/2008  . Essential hypertension, benign 02/25/2008  . 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   Madelyn Flavors PT 05/12/2019, 5:42 PM  [facility] [place], [place], [address] Phone: [phone]   Fax:  [phone]  Name: Donald Patel MRN: [id number] Date of Birth: 1941/06/10   PHYSICAL THERAPY DISCHARGE SUMMARY  Visits from Start of Care: 1  Current functional level related to goals / functional outcomes: See above   Remaining deficits: See above   Education / Equipment: HEP Plan: Patient agrees to discharge.  Patient goals were not met. Patient is being discharged due to not returning since the last visit.   ?????    Madelyn Flavors, PT 06/28/19 10:25 AM   [facility] at [place], [place] [address]  [phone] (office) [phone] (fax)

## 2019-05-18 ENCOUNTER — Encounter: Payer: Medicare Other | Admitting: Physical Therapy

## 2019-05-20 ENCOUNTER — Encounter: Payer: Medicare Other | Admitting: Physical Therapy

## 2019-05-25 ENCOUNTER — Encounter: Payer: Medicare Other | Admitting: Physical Therapy

## 2019-05-28 ENCOUNTER — Encounter: Payer: Medicare Other | Admitting: Physical Therapy

## 2019-06-01 ENCOUNTER — Encounter: Payer: Medicare Other | Admitting: Physical Therapy

## 2019-06-03 ENCOUNTER — Ambulatory Visit (INDEPENDENT_AMBULATORY_CARE_PROVIDER_SITE_OTHER): Payer: Medicare Other | Admitting: *Deleted

## 2019-06-03 DIAGNOSIS — I442 Atrioventricular block, complete: Secondary | ICD-10-CM

## 2019-06-03 LAB — CUP PACEART REMOTE DEVICE CHECK
Battery Remaining Longevity: 104 mo
Battery Remaining Percentage: 89 %
Battery Voltage: 2.95 V
Brady Statistic AP VP Percent: 16 %
Brady Statistic AP VS Percent: 1 %
Brady Statistic AS VP Percent: 84 %
Brady Statistic AS VS Percent: 1 %
Brady Statistic RA Percent Paced: 16 %
Brady Statistic RV Percent Paced: 99 %
Date Time Interrogation Session: 20201224020016
Implantable Lead Implant Date: 20051123
Implantable Lead Implant Date: 20051123
Implantable Lead Location: 753859
Implantable Lead Location: 753860
Implantable Pulse Generator Implant Date: 20150219
Lead Channel Impedance Value: 450 Ohm
Lead Channel Impedance Value: 540 Ohm
Lead Channel Pacing Threshold Amplitude: 0.5 V
Lead Channel Pacing Threshold Amplitude: 1.25 V
Lead Channel Pacing Threshold Pulse Width: 0.5 ms
Lead Channel Pacing Threshold Pulse Width: 0.5 ms
Lead Channel Sensing Intrinsic Amplitude: 11 mV
Lead Channel Sensing Intrinsic Amplitude: 5 mV
Lead Channel Setting Pacing Amplitude: 1.5 V
Lead Channel Setting Pacing Amplitude: 2 V
Lead Channel Setting Pacing Pulse Width: 0.5 ms
Lead Channel Setting Sensing Sensitivity: 5 mV
Pulse Gen Model: 2240
Pulse Gen Serial Number: 7596156

## 2019-06-06 NOTE — Progress Notes (Signed)
PPM Remote  

## 2019-08-19 ENCOUNTER — Encounter: Payer: Self-pay | Admitting: Family Medicine

## 2019-08-19 DIAGNOSIS — Z961 Presence of intraocular lens: Secondary | ICD-10-CM | POA: Diagnosis not present

## 2019-08-19 DIAGNOSIS — H527 Unspecified disorder of refraction: Secondary | ICD-10-CM | POA: Diagnosis not present

## 2019-08-19 DIAGNOSIS — H26493 Other secondary cataract, bilateral: Secondary | ICD-10-CM | POA: Diagnosis not present

## 2019-08-19 DIAGNOSIS — H35373 Puckering of macula, bilateral: Secondary | ICD-10-CM | POA: Diagnosis not present

## 2019-09-01 DIAGNOSIS — H35351 Cystoid macular degeneration, right eye: Secondary | ICD-10-CM | POA: Diagnosis not present

## 2019-09-01 DIAGNOSIS — H359 Unspecified retinal disorder: Secondary | ICD-10-CM | POA: Diagnosis not present

## 2019-09-01 DIAGNOSIS — H35373 Puckering of macula, bilateral: Secondary | ICD-10-CM | POA: Diagnosis not present

## 2019-09-01 DIAGNOSIS — H43812 Vitreous degeneration, left eye: Secondary | ICD-10-CM | POA: Diagnosis not present

## 2019-09-02 ENCOUNTER — Ambulatory Visit (INDEPENDENT_AMBULATORY_CARE_PROVIDER_SITE_OTHER): Payer: Medicare Other | Admitting: *Deleted

## 2019-09-02 DIAGNOSIS — I442 Atrioventricular block, complete: Secondary | ICD-10-CM

## 2019-09-02 LAB — CUP PACEART REMOTE DEVICE CHECK
Battery Remaining Longevity: 104 mo
Battery Remaining Percentage: 89 %
Battery Voltage: 2.95 V
Brady Statistic AP VP Percent: 12 %
Brady Statistic AP VS Percent: 1 %
Brady Statistic AS VP Percent: 88 %
Brady Statistic AS VS Percent: 1 %
Brady Statistic RA Percent Paced: 12 %
Brady Statistic RV Percent Paced: 99 %
Date Time Interrogation Session: 20210325020013
Implantable Lead Implant Date: 20051123
Implantable Lead Implant Date: 20051123
Implantable Lead Location: 753859
Implantable Lead Location: 753860
Implantable Pulse Generator Implant Date: 20150219
Lead Channel Impedance Value: 460 Ohm
Lead Channel Impedance Value: 540 Ohm
Lead Channel Pacing Threshold Amplitude: 0.5 V
Lead Channel Pacing Threshold Amplitude: 1.375 V
Lead Channel Pacing Threshold Pulse Width: 0.5 ms
Lead Channel Pacing Threshold Pulse Width: 0.5 ms
Lead Channel Sensing Intrinsic Amplitude: 11 mV
Lead Channel Sensing Intrinsic Amplitude: 4.7 mV
Lead Channel Setting Pacing Amplitude: 1.625
Lead Channel Setting Pacing Amplitude: 2 V
Lead Channel Setting Pacing Pulse Width: 0.5 ms
Lead Channel Setting Sensing Sensitivity: 5 mV
Pulse Gen Model: 2240
Pulse Gen Serial Number: 7596156

## 2019-09-03 NOTE — Progress Notes (Signed)
PPM Remote  

## 2019-11-09 ENCOUNTER — Encounter: Payer: Self-pay | Admitting: Family Medicine

## 2019-11-09 ENCOUNTER — Ambulatory Visit (INDEPENDENT_AMBULATORY_CARE_PROVIDER_SITE_OTHER): Payer: Medicare Other | Admitting: Family Medicine

## 2019-11-09 VITALS — BP 136/76 | HR 60 | Ht 70.0 in | Wt 148.0 lb

## 2019-11-09 DIAGNOSIS — M5441 Lumbago with sciatica, right side: Secondary | ICD-10-CM

## 2019-11-09 DIAGNOSIS — Z950 Presence of cardiac pacemaker: Secondary | ICD-10-CM | POA: Diagnosis not present

## 2019-11-09 DIAGNOSIS — I10 Essential (primary) hypertension: Secondary | ICD-10-CM | POA: Diagnosis not present

## 2019-11-09 DIAGNOSIS — M2391 Unspecified internal derangement of right knee: Secondary | ICD-10-CM | POA: Diagnosis not present

## 2019-11-09 DIAGNOSIS — G8929 Other chronic pain: Secondary | ICD-10-CM | POA: Diagnosis not present

## 2019-11-09 HISTORY — DX: Lumbago with sciatica, right side: M54.41

## 2019-11-09 HISTORY — DX: Other chronic pain: G89.29

## 2019-11-09 LAB — BASIC METABOLIC PANEL WITH GFR
BUN: 15 mg/dL (ref 7–25)
CO2: 31 mmol/L (ref 20–32)
Calcium: 10 mg/dL (ref 8.6–10.3)
Chloride: 99 mmol/L (ref 98–110)
Creat: 1.01 mg/dL (ref 0.70–1.18)
GFR, Est African American: 82 mL/min/{1.73_m2} (ref >=60)
GFR, Est Non African American: 70 mL/min/{1.73_m2} (ref >=60)
Glucose, Bld: 99 mg/dL (ref 65–99)
Potassium: 4.2 mmol/L (ref 3.5–5.3)
Sodium: 138 mmol/L (ref 135–146)

## 2019-11-09 NOTE — Assessment & Plan Note (Signed)
Well controlled. Continue current regimen. Follow up in  6 mo  

## 2019-11-09 NOTE — Progress Notes (Signed)
Established Patient Office Visit  Subjective:  Patient ID: Donald Patel, male    DOB: 10-Nov-1940  Age: 79 y.o. MRN: [id number]  CC:  Chief Complaint  Patient presents with  . Hypertension    HPI Donald Patel presents for   Hypertension- Pt denies chest pain, SOB, dizziness, or heart palpitations.  Taking meds as directed w/o problems.  Denies medication side effects.  Orts home blood pressures have been running in the upper 130s to low 140s.  Chronic right-sided low back pain and right knee pain-went to one physical therapy sessions.  He says he was unable to afford the $30 co-pay more than once he has been try to do the home stretches and exercises.  Please see prior notes.  He had surgery as a child on that right knee and has had problems ever since.  He says sometimes it just feels achy on the sides and around the knee.  Sometimes it will actually most feel like it is cramping and or locking.  And sometimes the whole leg hurts.  He denies any significant weakness he does take ibuprofen.  He says lately it is been bothering him more at night.  But previously it was bothering him during his golf game.  Past Medical History:  Diagnosis Date  . AV BLOCK, COMPLETE    s/p PPM (SJM)  . Cataract both eyes corrected  . Chronic right-sided low back pain with right-sided sciatica 11/09/2019  . ERECTILE DYSFUNCTION, ORGANIC   . Hypertension   . Hypertension     Past Surgical History:  Procedure Laterality Date  . CATARACT EXTRACTION W/ INTRAOCULAR LENS IMPLANT  2013   right eye - [facility]  . CATARACT EXTRACTION W/ INTRAOCULAR LENS IMPLANT  2015   Left eye - [place]  . KNEE SURGERY    . PACEMAKER PLACEMENT  12/22/96   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, most recent generator (SJM) 05/02/04 both implanted by Dr Rebecka Apley [facility]  . PERMANENT PACEMAKER GENERATOR CHANGE N/A 07/29/2013   Procedure: PERMANENT PACEMAKER GENERATOR CHANGE;  Surgeon: Coralyn Mark, MD;  Location: [facility] CATH [facility];   Service: Cardiovascular;  Laterality: N/A;  . VASECTOMY      Family History  Problem Relation Age of Onset  . Heart attack Brother   . Diabetes Other   . Hodgkin's lymphoma Brother     Social History   Socioeconomic History  . Marital status: Married    Spouse name: Not on file  . Number of children: Not on file  . Years of education: Not on file  . Highest education level: Not on file  Occupational History  . Occupation: Retired  Tobacco Use  . Smoking status: Former Smoker    Packs/day: 1.50    Years: 35.00    Pack years: 52.50    Types: Cigarettes, Pipe, Cigars    Quit date: 06/10/1985    Years since quitting: 34.4  . Smokeless tobacco: Never Used  Substance and Sexual Activity  . Alcohol use: Yes    Alcohol/week: 14.0 standard drinks    Types: 14 Cans of beer per week  . Drug use: No  . Sexual activity: Never  Other Topics Concern  . Not on file  Social History Narrative   Recently retired,Former Furniture conservator/restorer.  Exercise.    Social Determinants of Health   Financial Resource Strain:   . Difficulty of Paying Living Expenses:   Food Insecurity:   . Worried About Charity fundraiser in  the Last Year:   . [place] in the Last Year:   Transportation Needs:   . Film/video editor (Medical):   Marland Kitchen Lack of Transportation (Non-Medical):   Physical Activity:   . Days of Exercise per Week:   . Minutes of Exercise per Session:   Stress:   . Feeling of Stress :   Social Connections:   . Frequency of Communication with Friends and Family:   . Frequency of Social Gatherings with Friends and Family:   . Attends Religious Services:   . Active Member of Clubs or Organizations:   . Attends Archivist Meetings:   Marland Kitchen Marital Status:   Intimate Partner Violence:   . Fear of Current or Ex-Partner:   . Emotionally Abused:   Marland Kitchen Physically Abused:   . Sexually Abused:     Outpatient Medications Prior to Visit  Medication Sig Dispense Refill  .  acetaminophen (TYLENOL) 325 MG tablet Take 650 mg by mouth as needed.    . AMBULATORY NON FORMULARY MEDICATION Medication Name: Tdap x1 IM 1 vial 0  . Cholecalciferol (VITAMIN D3) 25 MCG/SPRAY LIQD Take by mouth.    . hydrochlorothiazide (HYDRODIURIL) 25 MG tablet TAKE 1 TABLET BY MOUTH EVERY DAY. 90 tablet 3  . losartan (COZAAR) 25 MG tablet Take 1 tablet (25 mg total) by mouth daily. 90 tablet 3  . Multiple Vitamins-Minerals (PRESERVISION AREDS 2 PO) Take by mouth daily.     No facility-administered medications prior to visit.    Allergies  Allergen Reactions  . Lisinopril Other (See Comments)    ROS Review of Systems    Objective:    Physical Exam  Constitutional: He is oriented to person, place, and time. He appears well-developed and well-nourished.  HENT:  Head: Normocephalic and atraumatic.  Cardiovascular: Normal rate, regular rhythm and normal heart sounds.  Pulmonary/Chest: Effort normal and breath sounds normal.  Neurological: He is alert and oriented to person, place, and time.  Skin: Skin is warm and dry.  Psychiatric: He has a normal mood and affect. His behavior is normal.    BP 136/76   Pulse 60   Ht 5\' 10"  (1.778 m)   Wt 148 lb (67.1 kg)   SpO2 100%   BMI 21.24 kg/m  Wt Readings from Last 3 Encounters:  11/09/19 148 lb (67.1 kg)  05/11/19 149 lb (67.6 kg)  04/29/19 140 lb (63.5 kg)     There are no preventive care reminders to display for this patient.  There are no preventive care reminders to display for this patient.  Lab Results  Component Value Date   TSH 1.40 04/13/2019   Lab Results  Component Value Date   WBC 6.9 01/29/2018   HGB 16.2 01/29/2018   HCT 45.1 01/29/2018   MCV 91.1 01/29/2018   PLT 216 01/29/2018   Lab Results  Component Value Date   NA 140 04/13/2019   K 3.6 04/13/2019   CO2 32 04/13/2019   GLUCOSE 100 (H) 04/13/2019   BUN 12 04/13/2019   CREATININE 0.92 04/13/2019   BILITOT 0.9 04/13/2019   ALKPHOS 73  11/05/2016   AST 16 04/13/2019   ALT 12 04/13/2019   PROT 7.3 04/13/2019   ALBUMIN 4.2 11/05/2016   CALCIUM 9.9 04/13/2019   GFR 83.49 07/28/2013   Lab Results  Component Value Date   CHOL 188 04/13/2019   Lab Results  Component Value Date   HDL 66 04/13/2019   Lab Results  Component Value Date   LDLCALC 107 (H) 04/13/2019   Lab Results  Component Value Date   TRIG 63 04/13/2019   Lab Results  Component Value Date   CHOLHDL 2.8 04/13/2019   Lab Results  Component Value Date   HGBA1C 5.2 05/07/2016      Assessment & Plan:   Problem List Items Addressed This Visit      Cardiovascular and Mediastinum   Essential hypertension, benign - Primary    Well controlled. Continue current regimen. Follow up in  6 mo      Relevant Orders   BASIC METABOLIC PANEL WITH GFR     Nervous and Auditory   Chronic right-sided low back pain with right-sided sciatica     Other   PACEMAKER-[facility]   Knee locking, right     Right knee pain/right leg pain-we did discuss that we could always look at blood flow as well and make sure there is no sign of peripheral vascular disease especially since sometimes the entire leg is bothersome.  Also discussed the possibility of an injection which she declines.  No orders of the defined types were placed in this encounter.   Follow-up: Return in about 6 months (around 05/10/2020) for Hypertension.    Beatrice Lecher, MD

## 2019-11-10 NOTE — Progress Notes (Signed)
All labs are normal. 

## 2019-12-02 ENCOUNTER — Ambulatory Visit (INDEPENDENT_AMBULATORY_CARE_PROVIDER_SITE_OTHER): Payer: Medicare Other | Admitting: *Deleted

## 2019-12-02 DIAGNOSIS — I442 Atrioventricular block, complete: Secondary | ICD-10-CM | POA: Diagnosis not present

## 2019-12-02 LAB — CUP PACEART REMOTE DEVICE CHECK
Battery Remaining Longevity: 94 mo
Battery Remaining Percentage: 80 %
Battery Voltage: 2.93 V
Brady Statistic AP VP Percent: 13 %
Brady Statistic AP VS Percent: 1 %
Brady Statistic AS VP Percent: 87 %
Brady Statistic AS VS Percent: 1 %
Brady Statistic RA Percent Paced: 13 %
Brady Statistic RV Percent Paced: 99 %
Date Time Interrogation Session: 20210624020012
Implantable Lead Implant Date: 20051123
Implantable Lead Implant Date: 20051123
Implantable Lead Location: 753859
Implantable Lead Location: 753860
Implantable Pulse Generator Implant Date: 20150219
Lead Channel Impedance Value: 440 Ohm
Lead Channel Impedance Value: 510 Ohm
Lead Channel Pacing Threshold Amplitude: 0.5 V
Lead Channel Pacing Threshold Amplitude: 1.5 V
Lead Channel Pacing Threshold Pulse Width: 0.5 ms
Lead Channel Pacing Threshold Pulse Width: 0.5 ms
Lead Channel Sensing Intrinsic Amplitude: 11.2 mV
Lead Channel Sensing Intrinsic Amplitude: 5 mV
Lead Channel Setting Pacing Amplitude: 1.75 V
Lead Channel Setting Pacing Amplitude: 2 V
Lead Channel Setting Pacing Pulse Width: 0.5 ms
Lead Channel Setting Sensing Sensitivity: 5 mV
Pulse Gen Model: 2240
Pulse Gen Serial Number: 7596156

## 2019-12-03 NOTE — Progress Notes (Signed)
Remote pacemaker transmission.   

## 2020-03-02 ENCOUNTER — Ambulatory Visit (INDEPENDENT_AMBULATORY_CARE_PROVIDER_SITE_OTHER): Payer: Medicare Other | Admitting: Emergency Medicine

## 2020-03-02 DIAGNOSIS — I442 Atrioventricular block, complete: Secondary | ICD-10-CM | POA: Diagnosis not present

## 2020-03-03 LAB — CUP PACEART REMOTE DEVICE CHECK
Battery Remaining Longevity: 94 mo
Battery Remaining Percentage: 80 %
Battery Voltage: 2.93 V
Brady Statistic AP VP Percent: 15 %
Brady Statistic AP VS Percent: 1 %
Brady Statistic AS VP Percent: 85 %
Brady Statistic AS VS Percent: 1 %
Brady Statistic RA Percent Paced: 15 %
Brady Statistic RV Percent Paced: 99 %
Date Time Interrogation Session: 20210923033613
Implantable Lead Implant Date: 20051123
Implantable Lead Implant Date: 20051123
Implantable Lead Location: 753859
Implantable Lead Location: 753860
Implantable Pulse Generator Implant Date: 20150219
Lead Channel Impedance Value: 460 Ohm
Lead Channel Impedance Value: 530 Ohm
Lead Channel Pacing Threshold Amplitude: 0.5 V
Lead Channel Pacing Threshold Amplitude: 1 V
Lead Channel Pacing Threshold Pulse Width: 0.5 ms
Lead Channel Pacing Threshold Pulse Width: 0.5 ms
Lead Channel Sensing Intrinsic Amplitude: 11.2 mV
Lead Channel Sensing Intrinsic Amplitude: 5 mV
Lead Channel Setting Pacing Amplitude: 1.25 V
Lead Channel Setting Pacing Amplitude: 2 V
Lead Channel Setting Pacing Pulse Width: 0.5 ms
Lead Channel Setting Sensing Sensitivity: 5 mV
Pulse Gen Model: 2240
Pulse Gen Serial Number: 7596156

## 2020-03-07 NOTE — Progress Notes (Signed)
Remote pacemaker transmission.   

## 2020-03-31 ENCOUNTER — Other Ambulatory Visit: Payer: Self-pay | Admitting: Student

## 2020-03-31 ENCOUNTER — Other Ambulatory Visit: Payer: Self-pay | Admitting: Internal Medicine

## 2020-04-04 ENCOUNTER — Other Ambulatory Visit: Payer: Self-pay | Admitting: Internal Medicine

## 2020-04-05 ENCOUNTER — Other Ambulatory Visit: Payer: Self-pay | Admitting: Family Medicine

## 2020-05-11 ENCOUNTER — Ambulatory Visit (INDEPENDENT_AMBULATORY_CARE_PROVIDER_SITE_OTHER): Payer: Medicare Other | Admitting: Family Medicine

## 2020-05-11 VITALS — BP 140/82 | HR 60 | Ht 70.0 in | Wt 145.0 lb

## 2020-05-11 DIAGNOSIS — M5441 Lumbago with sciatica, right side: Secondary | ICD-10-CM | POA: Diagnosis not present

## 2020-05-11 DIAGNOSIS — I10 Essential (primary) hypertension: Secondary | ICD-10-CM

## 2020-05-11 DIAGNOSIS — G8929 Other chronic pain: Secondary | ICD-10-CM | POA: Diagnosis not present

## 2020-05-11 NOTE — Assessment & Plan Note (Signed)
Repeat blood pressure looks much better today.  Reports home blood pressures mostly in the 130s and low 140s which I think based on his age is acceptable.  Continue to monitor.  Did encourage him to bring his cuff in with him next visit and as well as his home blood pressure log.  He normally brings 1 and but says he forgot it today as he was in a rush.

## 2020-05-11 NOTE — Assessment & Plan Note (Signed)
Did report that he feels like his pain has been a little bit more flared ever since he got his Covid vaccine back in the spring.  He has tried physical therapy before.  He says he really just lives with it he is not wanting to do anything different at this point in time but did discuss with him that if at any point his pain worsens or flares and he really cannot seem to get it back under control just let us know.  We can do additional work-up and possibly referral to orthopedics if needed.

## 2020-05-11 NOTE — Progress Notes (Signed)
Established Patient Office Visit  Subjective:  Patient ID: Donald Patel, male    DOB: 1940-09-18  Age: 79 y.o. MRN: [id number]  CC:  Chief Complaint  Patient presents with  . Hypertension    HPI PAULO KEIMIG presents for   Hypertension- Pt denies chest pain, SOB, dizziness, or heart palpitations.  Taking meds as directed w/o problems.  Denies medication side effects.    Has had covid vaccine. Feels like his back and side and hip pain has been worse since received the vaccine.  Not interested in a booster. Declines flu vaccine   Past Medical History:  Diagnosis Date  . AV BLOCK, COMPLETE    s/p PPM (SJM)  . Cataract both eyes corrected  . Chronic right-sided low back pain with right-sided sciatica 11/09/2019  . ERECTILE DYSFUNCTION, ORGANIC   . Hypertension   . Hypertension     Past Surgical History:  Procedure Laterality Date  . CATARACT EXTRACTION W/ INTRAOCULAR LENS IMPLANT  2013   right eye - [facility]  . CATARACT EXTRACTION W/ INTRAOCULAR LENS IMPLANT  2015   Left eye - [place]  . KNEE SURGERY    . PACEMAKER PLACEMENT  12/22/96   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, most recent generator (SJM) 05/02/04 both implanted by Dr Rebecka Apley [facility]  . PERMANENT PACEMAKER GENERATOR CHANGE N/A 07/29/2013   Procedure: PERMANENT PACEMAKER GENERATOR CHANGE;  Surgeon: Coralyn Mark, MD;  Location: [facility] CATH LAB;  Service: Cardiovascular;  Laterality: N/A;  . VASECTOMY      Family History  Problem Relation Age of Onset  . Heart attack Brother   . Diabetes Other   . Hodgkin's lymphoma Brother     Social History   Socioeconomic History  . Marital status: Married    Spouse name: Not on file  . Number of children: Not on file  . Years of education: Not on file  . Highest education level: Not on file  Occupational History  . Occupation: Retired  Tobacco Use  . Smoking status: Former Smoker    Packs/day: 1.50    Years: 35.00    Pack years: 52.50    Types: Cigarettes,  Pipe, Cigars    Quit date: 06/10/1985    Years since quitting: 34.9  . Smokeless tobacco: Never Used  Substance and Sexual Activity  . Alcohol use: Yes    Alcohol/week: 14.0 standard drinks    Types: 14 Cans of beer per week  . Drug use: No  . Sexual activity: Never  Other Topics Concern  . Not on file  Social History Narrative   Recently retired,Former Furniture conservator/restorer.  Exercise.    Social Determinants of Health   Financial Resource Strain:   . Difficulty of Paying Living Expenses: Not on file  Food Insecurity:   . Worried About Charity fundraiser in the Last Year: Not on file  . Ran Out of Food in the Last Year: Not on file  Transportation Needs:   . Lack of Transportation (Medical): Not on file  . Lack of Transportation (Non-Medical): Not on file  Physical Activity:   . Days of Exercise per Week: Not on file  . Minutes of Exercise per Session: Not on file  Stress:   . Feeling of Stress : Not on file  Social Connections:   . Frequency of Communication with Friends and Family: Not on file  . Frequency of Social Gatherings with Friends and Family: Not on file  . Attends Religious Services:  Not on file  . Active Member of Clubs or Organizations: Not on file  . Attends Archivist Meetings: Not on file  . Marital Status: Not on file  Intimate Partner Violence:   . Fear of Current or Ex-Partner: Not on file  . Emotionally Abused: Not on file  . Physically Abused: Not on file  . Sexually Abused: Not on file    Outpatient Medications Prior to Visit  Medication Sig Dispense Refill  . acetaminophen (TYLENOL) 325 MG tablet Take 650 mg by mouth as needed.    . AMBULATORY NON FORMULARY MEDICATION Medication Name: Tdap x1 IM 1 vial 0  . Cholecalciferol (VITAMIN D3) 25 MCG/SPRAY LIQD Take by mouth.    . hydrochlorothiazide (HYDRODIURIL) 25 MG tablet TAKE 1 TABLET BY MOUTH EVERY DAY 90 tablet 0  . losartan (COZAAR) 25 MG tablet TAKE 1 TABLET(25 MG) BY MOUTH DAILY 90 tablet 3   . Multiple Vitamins-Minerals (PRESERVISION AREDS 2 PO) Take by mouth daily.     No facility-administered medications prior to visit.    Allergies  Allergen Reactions  . Lisinopril Other (See Comments)    ROS Review of Systems    Objective:    Physical Exam Constitutional:      Appearance: He is well-developed.  HENT:     Head: Normocephalic and atraumatic.  Cardiovascular:     Rate and Rhythm: Normal rate and regular rhythm.     Heart sounds: Normal heart sounds.  Pulmonary:     Effort: Pulmonary effort is normal.     Breath sounds: Normal breath sounds.  Skin:    General: Skin is warm and dry.  Neurological:     Mental Status: He is alert and oriented to person, place, and time.  Psychiatric:        Behavior: Behavior normal.     BP 140/82   Pulse 60   Ht 5\' 10"  (1.778 m)   Wt 145 lb (65.8 kg)   SpO2 100%   BMI 20.81 kg/m  Wt Readings from Last 3 Encounters:  05/11/20 145 lb (65.8 kg)  11/09/19 148 lb (67.1 kg)  05/11/19 149 lb (67.6 kg)     There are no preventive care reminders to display for this patient.  There are no preventive care reminders to display for this patient.  Lab Results  Component Value Date   TSH 1.40 04/13/2019   Lab Results  Component Value Date   WBC 6.9 01/29/2018   HGB 16.2 01/29/2018   HCT 45.1 01/29/2018   MCV 91.1 01/29/2018   PLT 216 01/29/2018   Lab Results  Component Value Date   NA 138 11/09/2019   K 4.2 11/09/2019   CO2 31 11/09/2019   GLUCOSE 99 11/09/2019   BUN 15 11/09/2019   CREATININE 1.01 11/09/2019   BILITOT 0.9 04/13/2019   ALKPHOS 73 11/05/2016   AST 16 04/13/2019   ALT 12 04/13/2019   PROT 7.3 04/13/2019   ALBUMIN 4.2 11/05/2016   CALCIUM 10.0 11/09/2019   GFR 83.49 07/28/2013   Lab Results  Component Value Date   CHOL 188 04/13/2019   Lab Results  Component Value Date   HDL 66 04/13/2019   Lab Results  Component Value Date   LDLCALC 107 (H) 04/13/2019   Lab Results  Component  Value Date   TRIG 63 04/13/2019   Lab Results  Component Value Date   CHOLHDL 2.8 04/13/2019   Lab Results  Component Value Date   HGBA1C 5.2  05/07/2016      Assessment & Plan:   Problem List Items Addressed This Visit      Cardiovascular and Mediastinum   Essential hypertension, benign - Primary    Repeat blood pressure looks much better today.  Reports home blood pressures mostly in the 130s and low 140s which I think based on his age is acceptable.  Continue to monitor.  Did encourage him to bring his cuff in with him next visit and as well as his home blood pressure log.  He normally brings 1 and but says he forgot it today as he was in a rush.      Relevant Orders   COMPLETE METABOLIC PANEL WITH GFR   Lipid panel     Nervous and Auditory   Chronic right-sided low back pain with right-sided sciatica    Did report that he feels like his pain has been a little bit more flared ever since he got his Covid vaccine back in the spring.  He has tried physical therapy before.  He says he really just lives with it he is not wanting to do anything different at this point in time but did discuss with him that if at any point his pain worsens or flares and he really cannot seem to get it back under control just let us know.  We can do additional work-up and possibly referral to orthopedics if needed.         Declines flu vaccine.  No orders of the defined types were placed in this encounter.   Follow-up: Return in about 6 months (around 11/09/2020) for Hypertension.    Beatrice Lecher, MD

## 2020-05-12 ENCOUNTER — Encounter: Payer: Self-pay | Admitting: Family Medicine

## 2020-05-12 DIAGNOSIS — I700 Atherosclerosis of aorta: Secondary | ICD-10-CM | POA: Insufficient documentation

## 2020-05-12 LAB — COMPLETE METABOLIC PANEL WITH GFR
AG Ratio: 1.4 (calc) (ref 1.0–2.5)
ALT: 17 U/L (ref 9–46)
AST: 20 U/L (ref 10–35)
Albumin: 4.5 g/dL (ref 3.6–5.1)
Alkaline phosphatase (APISO): 73 U/L (ref 35–144)
BUN: 13 mg/dL (ref 7–25)
CO2: 25 mmol/L (ref 20–32)
Calcium: 9.8 mg/dL (ref 8.6–10.3)
Chloride: 96 mmol/L — ABNORMAL LOW (ref 98–110)
Creat: 1 mg/dL (ref 0.70–1.11)
GFR, Est African American: 82 mL/min/{1.73_m2} (ref >=60)
GFR, Est Non African American: 71 mL/min/{1.73_m2} (ref >=60)
Globulin: 3.2 g/dL (calc) (ref 1.9–3.7)
Glucose, Bld: 64 mg/dL — ABNORMAL LOW (ref 65–99)
Potassium: 3.7 mmol/L (ref 3.5–5.3)
Sodium: 139 mmol/L (ref 135–146)
Total Bilirubin: 1.1 mg/dL (ref 0.2–1.2)
Total Protein: 7.7 g/dL (ref 6.1–8.1)

## 2020-05-12 LAB — LIPID PANEL
Cholesterol: 218 mg/dL — ABNORMAL HIGH (ref <200)
HDL: 73 mg/dL (ref >=40)
LDL Cholesterol (Calc): 123 mg/dL (calc) — ABNORMAL HIGH
Non-HDL Cholesterol (Calc): 145 mg/dL (calc) — ABNORMAL HIGH (ref <130)
Total CHOL/HDL Ratio: 3 (calc) (ref <5.0)
Triglycerides: 112 mg/dL (ref <150)

## 2020-05-12 LAB — SPECIMEN COMPROMISED

## 2020-06-01 ENCOUNTER — Ambulatory Visit (INDEPENDENT_AMBULATORY_CARE_PROVIDER_SITE_OTHER): Payer: Medicare Other

## 2020-06-01 DIAGNOSIS — I442 Atrioventricular block, complete: Secondary | ICD-10-CM | POA: Diagnosis not present

## 2020-06-01 LAB — CUP PACEART REMOTE DEVICE CHECK
Battery Remaining Longevity: 80 mo
Battery Remaining Percentage: 72 %
Battery Voltage: 2.92 V
Brady Statistic AP VP Percent: 15 %
Brady Statistic AP VS Percent: 1 %
Brady Statistic AS VP Percent: 84 %
Brady Statistic AS VS Percent: 1 %
Brady Statistic RA Percent Paced: 15 %
Brady Statistic RV Percent Paced: 99 %
Date Time Interrogation Session: 20211223020016
Implantable Lead Implant Date: 20051123
Implantable Lead Implant Date: 20051123
Implantable Lead Location: 753859
Implantable Lead Location: 753860
Implantable Pulse Generator Implant Date: 20150219
Lead Channel Impedance Value: 460 Ohm
Lead Channel Impedance Value: 540 Ohm
Lead Channel Pacing Threshold Amplitude: 0.5 V
Lead Channel Pacing Threshold Amplitude: 1.5 V
Lead Channel Pacing Threshold Pulse Width: 0.5 ms
Lead Channel Pacing Threshold Pulse Width: 0.5 ms
Lead Channel Sensing Intrinsic Amplitude: 11.2 mV
Lead Channel Sensing Intrinsic Amplitude: 5 mV
Lead Channel Setting Pacing Amplitude: 1.75 V
Lead Channel Setting Pacing Amplitude: 2 V
Lead Channel Setting Pacing Pulse Width: 0.5 ms
Lead Channel Setting Sensing Sensitivity: 5 mV
Pulse Gen Model: 2240
Pulse Gen Serial Number: 7596156

## 2020-06-15 NOTE — Progress Notes (Signed)
Remote pacemaker transmission.   

## 2020-06-29 ENCOUNTER — Other Ambulatory Visit: Payer: Self-pay | Admitting: Internal Medicine

## 2020-06-29 NOTE — Telephone Encounter (Signed)
Outpatient Medication Detail   Disp Refills Start End   hydrochlorothiazide (HYDRODIURIL) 25 MG tablet 30 tablet 0 06/29/2020    Sig - Route: Take 1 tablet (25 mg total) by mouth daily. Pt needs to call and make appt with provider for further refills - 1st attempt - Oral   Sent to pharmacy as: hydrochlorothiazide (HYDRODIURIL) 25 MG tablet   Notes to Pharmacy: Pt needs to call and make appt with provider for further refills - 1st attempt   E-Prescribing Status: Receipt confirmed by pharmacy (06/29/2020  3:01 PM EST)     [place], [place] [address] [place]

## 2020-08-02 ENCOUNTER — Other Ambulatory Visit: Payer: Self-pay | Admitting: Internal Medicine

## 2020-08-11 ENCOUNTER — Encounter: Payer: Self-pay | Admitting: Family Medicine

## 2020-08-11 DIAGNOSIS — R159 Full incontinence of feces: Secondary | ICD-10-CM

## 2020-08-11 NOTE — Telephone Encounter (Signed)
Referral signed.

## 2020-08-16 ENCOUNTER — Encounter: Payer: Self-pay | Admitting: Gastroenterology

## 2020-08-23 ENCOUNTER — Encounter: Payer: Self-pay | Admitting: Family Medicine

## 2020-08-31 ENCOUNTER — Ambulatory Visit (INDEPENDENT_AMBULATORY_CARE_PROVIDER_SITE_OTHER): Payer: Medicare Other

## 2020-08-31 DIAGNOSIS — I442 Atrioventricular block, complete: Secondary | ICD-10-CM | POA: Diagnosis not present

## 2020-09-01 ENCOUNTER — Other Ambulatory Visit: Payer: Self-pay | Admitting: Internal Medicine

## 2020-09-01 LAB — CUP PACEART REMOTE DEVICE CHECK
Battery Remaining Longevity: 80 mo
Battery Remaining Percentage: 72 %
Battery Voltage: 2.92 V
Brady Statistic AP VP Percent: 15 %
Brady Statistic AP VS Percent: 1 %
Brady Statistic AS VP Percent: 85 %
Brady Statistic AS VS Percent: 1 %
Brady Statistic RA Percent Paced: 15 %
Brady Statistic RV Percent Paced: 99 %
Date Time Interrogation Session: 20220324020012
Implantable Lead Implant Date: 20051123
Implantable Lead Implant Date: 20051123
Implantable Lead Location: 753859
Implantable Lead Location: 753860
Implantable Pulse Generator Implant Date: 20150219
Lead Channel Impedance Value: 450 Ohm
Lead Channel Impedance Value: 480 Ohm
Lead Channel Pacing Threshold Amplitude: 0.5 V
Lead Channel Pacing Threshold Amplitude: 1.5 V
Lead Channel Pacing Threshold Pulse Width: 0.5 ms
Lead Channel Pacing Threshold Pulse Width: 0.5 ms
Lead Channel Sensing Intrinsic Amplitude: 11.2 mV
Lead Channel Sensing Intrinsic Amplitude: 4.6 mV
Lead Channel Setting Pacing Amplitude: 1.75 V
Lead Channel Setting Pacing Amplitude: 2 V
Lead Channel Setting Pacing Pulse Width: 0.5 ms
Lead Channel Setting Sensing Sensitivity: 5 mV
Pulse Gen Model: 2240
Pulse Gen Serial Number: 7596156

## 2020-09-08 ENCOUNTER — Encounter: Payer: Self-pay | Admitting: Gastroenterology

## 2020-09-08 ENCOUNTER — Other Ambulatory Visit: Payer: Self-pay

## 2020-09-08 ENCOUNTER — Ambulatory Visit (INDEPENDENT_AMBULATORY_CARE_PROVIDER_SITE_OTHER): Payer: Medicare Other | Admitting: Gastroenterology

## 2020-09-08 VITALS — BP 154/84 | HR 75 | Ht 70.0 in | Wt 148.0 lb

## 2020-09-08 DIAGNOSIS — K921 Melena: Secondary | ICD-10-CM

## 2020-09-08 DIAGNOSIS — Z86010 Personal history of colonic polyps: Secondary | ICD-10-CM | POA: Diagnosis not present

## 2020-09-08 DIAGNOSIS — R159 Full incontinence of feces: Secondary | ICD-10-CM

## 2020-09-08 MED ORDER — CLENPIQ 10-3.5-12 MG-GM -GM/160ML PO SOLN: 1.0000 | ORAL | 0 refills | Status: DC

## 2020-09-08 NOTE — Patient Instructions (Addendum)
If you are age 81 or older, your body mass index should be between 23-30. Your Body mass index is 21.24 kg/m. If this is out of the aforementioned range listed, please consider follow up with your Primary Care Provider.  If you are age 57 or younger, your body mass index should be between 19-25. Your Body mass index is 21.24 kg/m. If this is out of the aformentioned range listed, please consider follow up with your Primary Care Provider.   We have sent the following medications to your pharmacy for you to pick up at your convenience:  Clenpiq for Colonoscopy   Please start a fiber supplement daily ex: Benefiber, Metamucil   Due to recent changes in healthcare laws, you may see the results of your imaging and laboratory studies on MyChart before your provider has had a chance to review them.  We understand that in some cases there may be results that are confusing or concerning to you. Not all laboratory results come back in the same time frame and the provider may be waiting for multiple results in order to interpret others.  Please give [place] 48 hours in order for your provider to thoroughly review all the results before contacting the office for clarification of your results.   Thank you for choosing me and [facility].  Vito Cirigliano, D.O.

## 2020-09-08 NOTE — Progress Notes (Signed)
Chief Complaint: Fecal seepage, polyp surveillance  Referring Provider:     Hali Marry, MD   HPI:     Donald Patel is a 80 y.o. male with a history of HTN, history of AV block s/p PPM, referred to the Gastroenterology Clinic for evaluation of fecal seepage that started after banding at outside facility 2013.  Now main issue is fecal seepage, which has been present since hemorrhoid banding in 2013. Can occur multiple times/day. No nocturnal leakage.  No urge incontinence.  Has had episodes of BRB on tissue paper, which he attributes to frequent wiping.  Will otherwise have formed stool. No abdominal pain, n/v/f/c. Has not trialed fiber supplement, etc. No prior ARM. No hx of anal fissure.   Endoscopic History: -Colonoscopy (03/2012, [facility]): Diminutive cecal polyp (SSP), 1.5 cm ascending colon polyp resected piecemeal and tattooed (TA), diminutive transverse polyp (TA), medium-sized internal/external hemorrhoids, sigmoid diverticulosis.  Recommend repeat in 6 months -Hemorrhoid banding at 3 separate sessions in 05/2012 -Colonoscopy (07/2012, Dr. Cindee Salt  at [facility]): 7-8 mm polyp at hepatic flexure, cecal polyp, prior polypectomy site in ascending colon biopsy, Scar from prior hemorrhoid banding and distal rectal, sigmoid diverticulosis.  No path review.  Repeat in 3 years -Colonoscopy (11/2015, Dr. Cindee Salt at [facility]): 5 mm tubular adenoma in ascending colon, no residual polyp at prior descending colon polypectomy site, pandiverticulosis, external hemorrhoids.  Scar from prior hemorrhoid banding in distal rectum.  Recommended repeat in 5 years   Past Medical History:  Diagnosis Date  . AV BLOCK, COMPLETE    s/p PPM (SJM)  . Cataract both eyes corrected  . Chronic right-sided low back pain with right-sided sciatica 11/09/2019  . ERECTILE DYSFUNCTION, ORGANIC   . History of colon polyps   . Hypertension   .  Hypertension      Past Surgical History:  Procedure Laterality Date  . CATARACT EXTRACTION W/ INTRAOCULAR LENS IMPLANT  2013   right eye - [facility]  . CATARACT EXTRACTION W/ INTRAOCULAR LENS IMPLANT  2015   Left eye - [place]  . COLONOSCOPY  11/29/2015   [facility]  . KNEE SURGERY    . PACEMAKER PLACEMENT  12/22/96   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, most recent generator (SJM) 05/02/04 both implanted by Dr Rebecka [facility]ple[facility]  . PERMANENT PACEMAKER GENERATOR CHANGE N/A 07/29/2013   Procedure: PERMANENT PACEMAKER GENERATOR CHANGE;  Surgeon: Coralyn Mark, MD;  Location: [place] CATH LAB;  Service: Cardiovascular;  Laterality: N/A;  . VASECTOMY     Family History  Problem Relation Age of Onset  . Breast cancer Mother   . Heart attack Brother   . Diabetes Other   . Hodgkin's lymphoma Brother   . Colon cancer Neg Hx   . Esophageal cancer Neg Hx    Social History   Tobacco Use  . Smoking status: Former Smoker    Packs/day: 1.50    Years: 35.00    Pack years: 52.50    Types: Cigarettes, Pipe, Cigars    Quit date: 06/10/1985    Years since quitting: 35.2  . Smokeless tobacco: Never Used  Substance Use Topics  . Alcohol use: Yes    Alcohol/week: 14.0 standard drinks    Types: 14 Cans of beer per week  . Drug use: No   Current Outpatient Medications  Medication Sig Dispense Refill  . Cholecalciferol (VITAMIN D3) 25  MCG/SPRAY LIQD Take by mouth.    . hydrochlorothiazide (HYDRODIURIL) 25 MG tablet Take 1 tablet (25 mg total) by mouth daily. Please make overdue appt with Dr. Rayann Heman before anymore refills. Thank you 3rd and Final Attempt 15 tablet 0  . ibuprofen (ADVIL) 200 MG tablet Take 200 mg by mouth as needed.    Marland Kitchen losartan (COZAAR) 25 MG tablet TAKE 1 TABLET(25 MG) BY MOUTH DAILY 90 tablet 3  . Multiple Vitamins-Minerals (PRESERVISION AREDS 2 PO) Take by mouth as needed.     No current facility-administered medications for this visit.   Allergies  Allergen  Reactions  . Lisinopril Other (See Comments)     Review of Systems: All systems reviewed and negative except where noted in HPI.     Physical Exam:    Wt Readings from Last 3 Encounters:  09/08/20 148 lb (67.1 kg)  05/11/20 145 lb (65.8 kg)  11/09/19 148 lb (67.1 kg)    BP (!) 154/84   Pulse 75   Ht 5\' 10"  (1.778 m)   Wt 148 lb (67.1 kg)   BMI 21.24 kg/m  Constitutional:  Pleasant, in no acute distress. Psychiatric: Normal mood and affect. Behavior is normal. EENT: Pupils normal.  Conjunctivae are normal. No scleral icterus. Neck supple. No cervical LAD. Cardiovascular: Normal rate, regular rhythm. No edema Pulmonary/chest: Effort normal and breath sounds normal. No wheezing, rales or rhonchi. Abdominal: Soft, nondistended, nontender. Bowel sounds active throughout. There are no masses palpable. No hepatomegaly. Neurological: Alert and oriented to person place and time. Skin: Skin is warm and dry. No rashes noted. Rectal: Exam deferred by patient to time of colonoscopy.    ASSESSMENT AND PLAN;   1) Fecal seepage -Unclear if this is related to prior hemorrhoid banding or just incidentally associated, but certainly requires further evaluation. -Evaluate for recurrent hemorrhoids, skin tags, distal anorectal pathology at time of colonoscopy -If colonoscopy largely unrevealing, plan for anorectal manometry -Start fiber supplement  2) History of colon polyps -Due for repeat colonoscopy for ongoing polyp surveillance  3) hematochezia -Intermittent episodes of scant BRBPR.  Evaluate for anorectal pathology at time of colonoscopy as above  The indications, risks, and benefits of colonoscopy were explained to the patient in detail. Risks include but are not limited to bleeding, perforation, adverse reaction to medications, and cardiopulmonary compromise. Sequelae include but are not limited to the possibility of surgery, hospitalization, and mortality. The patient verbalized  understanding and wished to proceed. All questions answered, referred to the scheduler and bowel prep ordered. Further recommendations pending results of the exam.     Lavena Bullion, DO, FACG  09/08/2020, 10:23 AM   Hali Marry, *

## 2020-09-12 NOTE — Progress Notes (Signed)
Remote pacemaker transmission.   

## 2020-09-18 NOTE — Progress Notes (Signed)
Electrophysiology Office Note Date: 09/19/2020  ID:  Donald Patel, DOB [birth year], MRN [id number]  PCP: Donald Marry, MD Primary Cardiologist: Donald Grayer, MD Electrophysiologist: Donald Grayer, MD   CC: Pacemaker follow-up  Donald Patel is a 80 y.o. male seen today for Donald Grayer, MD for routine electrophysiology followup. Last seen in 03/2019. Since last being seen in our clinic the patient reports doing very well.  he denies chest pain, palpitations, dyspnea, PND, orthopnea, nausea, vomiting, dizziness, syncope, edema, weight gain, or early satiety.  Device History: St. Jude Dual Chamber PPM implanted 1998, gen change 04/2004 by Donald Patel, gen change 07/2013 by Donald Patel for CHB  Past Medical History:  Diagnosis Date  . AV BLOCK, COMPLETE    s/p PPM (SJM)  . Cataract both eyes corrected  . Chronic right-sided low back pain with right-sided sciatica 11/09/2019  . ERECTILE DYSFUNCTION, ORGANIC   . History of colon polyps   . Hypertension   . Hypertension    Past Surgical History:  Procedure Laterality Date  . CATARACT EXTRACTION W/ INTRAOCULAR LENS IMPLANT  2013   right eye - [facility]  . CATARACT EXTRACTION W/ INTRAOCULAR LENS IMPLANT  2015   Left eye - [place]  . COLONOSCOPY  11/29/2015   [facility]  . KNEE SURGERY    . PACEMAKER PLACEMENT  12/22/96   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, most recent generator (SJM) 05/02/04 both implanted by Donald Patel [facility]  . PERMANENT PACEMAKER GENERATOR CHANGE N/A 07/29/2013   Procedure: PERMANENT PACEMAKER GENERATOR CHANGE;  Surgeon: Donald Mark, MD;  Location: [place] CATH LAB;  Service: Cardiovascular;  Laterality: N/A;  . VASECTOMY      Current Outpatient Medications  Medication Sig Dispense Refill  . Cholecalciferol (VITAMIN D3) 25 MCG/SPRAY LIQD Take by mouth.    Marland Kitchen ibuprofen (ADVIL) 200 MG tablet Take 200 mg by mouth as needed.    Marland Kitchen losartan (COZAAR) 25 MG tablet TAKE 1 TABLET(25 MG)  BY MOUTH DAILY 90 tablet 3  . Multiple Vitamins-Minerals (PRESERVISION AREDS 2 PO) Take by mouth as needed.    . Sod Picosulfate-Mag Ox-Cit Acd (CLENPIQ) 10-3.5-12 MG-GM -GM/160ML SOLN Take 1 kit by mouth as directed. 320 mL 0  . hydrochlorothiazide (HYDRODIURIL) 25 MG tablet Take 1 tablet (25 mg total) by mouth daily. 90 tablet 3   No current facility-administered medications for this visit.    Allergies:   Lisinopril   Social History: Social History   Socioeconomic History  . Marital status: Married    Spouse name: Not on file  . Number of children: Not on file  . Years of education: Not on file  . Highest education level: Not on file  Occupational History  . Occupation: Retired  Tobacco Use  . Smoking status: Former Smoker    Packs/day: 1.50    Years: 35.00    Pack years: 52.50    Types: Cigarettes, Pipe, Cigars    Quit date: 06/10/1985    Years since quitting: 35.3  . Smokeless tobacco: Never Used  Substance and Sexual Activity  . Alcohol use: Yes    Alcohol/week: 14.0 standard drinks    Types: 14 Cans of beer per week  . Drug use: No  . Sexual activity: Never  Other Topics Concern  . Not on file  Social History Narrative   Recently retired,Former Furniture conservator/restorer.  Exercise.    Social Determinants of Health   Financial Resource Strain: Not on file  Food Insecurity: Not on file  Transportation Needs: Not on file  Physical Activity: Not on file  Stress: Not on file  Social Connections: Not on file  Intimate Partner Violence: Not on file    Family History: Family History  Problem Relation Age of Onset  . Breast cancer Mother   . Heart attack Brother   . Diabetes Other   . Hodgkin's lymphoma Brother   . Colon cancer Neg Hx   . Esophageal cancer Neg Hx      Review of Systems: All other systems reviewed and are otherwise negative except as noted above.  Physical Exam: Vitals:   09/19/20 0953  BP: 140/68  Pulse: 67  SpO2: 96%  Weight: 146 lb (66.2 kg)   Height: 5' 10"  (1.778 m)     GEN- The patient is well appearing, alert and oriented x 3 today.   HEENT: normocephalic, atraumatic; sclera clear, conjunctiva pink; hearing intact; oropharynx clear; neck supple  Lungs- Clear to ausculation bilaterally, normal work of breathing.  No wheezes, rales, rhonchi Heart- Regular rate and rhythm, no murmurs, rubs or gallops  GI- soft, non-tender, non-distended, bowel sounds present  Extremities- no clubbing or cyanosis. No edema MS- no significant deformity or atrophy Skin- warm and dry, no rash or lesion; PPM pocket well healed Psych- euthymic mood, full affect Neuro- strength and sensation are intact  PPM Interrogation- reviewed in detail today,  See PACEART report  EKG:  EKG is ordered today. The ekg ordered today shows AS-VP at 67 bpm  Recent Labs: 05/11/2020: ALT 17; BUN 13; Creat 1.00; Potassium 3.7; Sodium 139   Wt Readings from Last 3 Encounters:  09/19/20 146 lb (66.2 kg)  09/08/20 148 lb (67.1 kg)  05/11/20 145 lb (65.8 kg)     Other studies Reviewed: Additional studies/ records that were reviewed today include: Previous EP office notes, Previous remote checks, Most recent labwork.   Assessment and Plan:  1. CHB s/p St. Jude PPM  Normal PPM function See Pace Art report No changes today  2. HTN Continue HCTZ. He feels like this medication increases his urine output. He is hesitant to be on multiple medications for one problem, but I discussed the additive effect of additional meds.  He is OK with trying Losartan 25 mg daily. It may take a week or so to bring his BP down. He will follow up with his PCP next month for further.   3. Paroxysmal atrial fibrillation Low burden by device interrogation and available EGMs appear AT with atrial rates in 150s.  Consider [place] if burden does increase or persistent AF noted  Current medicines are reviewed at length with the patient today.   The patient does not have concerns regarding  his medicines.  The following changes were made today:  none  Labs/ tests ordered today include:  Orders Placed This Encounter  Procedures  . EKG 12-Lead    Disposition:   Follow up with EP APP in 12 Months    Signed, Annamaria Helling  09/19/2020 11:32 AM  [facility] [address] [place] [address] [phone] (office) [phone] (fax)

## 2020-09-19 ENCOUNTER — Encounter: Payer: Self-pay | Admitting: Student

## 2020-09-19 ENCOUNTER — Ambulatory Visit: Payer: Medicare Other | Admitting: Student

## 2020-09-19 ENCOUNTER — Other Ambulatory Visit: Payer: Self-pay

## 2020-09-19 VITALS — BP 140/68 | HR 67 | Ht 70.0 in | Wt 146.0 lb

## 2020-09-19 DIAGNOSIS — I442 Atrioventricular block, complete: Secondary | ICD-10-CM

## 2020-09-19 DIAGNOSIS — I480 Paroxysmal atrial fibrillation: Secondary | ICD-10-CM | POA: Diagnosis not present

## 2020-09-19 DIAGNOSIS — I10 Essential (primary) hypertension: Secondary | ICD-10-CM

## 2020-09-19 MED ORDER — HYDROCHLOROTHIAZIDE 25 MG PO TABS: 25.0000 mg | ORAL_TABLET | Freq: Every day | ORAL | 3 refills | Status: DC

## 2020-09-19 NOTE — Patient Instructions (Signed)
Medication Instructions:  Your physician recommends that you continue on your current medications as directed. Please refer to the Current Medication list given to you today.  *If you need a refill on your cardiac medications before your next appointment, please call your pharmacy*   Lab Work: None If you have labs (blood work) drawn today and your tests are completely normal, you will receive your results only by: Marland Kitchen MyChart Message (if you have MyChart) OR . A paper copy in the mail If you have any lab test that is abnormal or we need to change your treatment, we will call you to review the results.  Follow-Up: At [facility], you and your health needs are our priority.  As part of our continuing mission to provide you with exceptional heart care, we have created designated Provider Care Teams.  These Care Teams include your primary Cardiologist (physician) and Advanced Practice Providers (APPs -  Physician Assistants and Nurse Practitioners) who all work together to provide you with the care you need, when you need it.  Your next appointment:   1 year(s)  The format for your next appointment:   In Person  Provider:   You may see [name], MD or one of the following Advanced Practice Providers on your designated Care Team:    [name], NP  Tommye Standard, PA-C  Legrand Como "[place]" [place], [place]

## 2020-10-03 ENCOUNTER — Ambulatory Visit (AMBULATORY_SURGERY_CENTER): Payer: Medicare Other | Admitting: Gastroenterology

## 2020-10-03 ENCOUNTER — Other Ambulatory Visit: Payer: Self-pay | Admitting: Gastroenterology

## 2020-10-03 ENCOUNTER — Encounter: Payer: Self-pay | Admitting: Gastroenterology

## 2020-10-03 ENCOUNTER — Other Ambulatory Visit: Payer: Self-pay

## 2020-10-03 VITALS — BP 145/106 | HR 60 | Temp 97.8°F | Resp 16 | Ht 70.0 in | Wt 148.0 lb

## 2020-10-03 DIAGNOSIS — Z86010 Personal history of colonic polyps: Secondary | ICD-10-CM

## 2020-10-03 DIAGNOSIS — D122 Benign neoplasm of ascending colon: Secondary | ICD-10-CM | POA: Diagnosis not present

## 2020-10-03 DIAGNOSIS — D125 Benign neoplasm of sigmoid colon: Secondary | ICD-10-CM | POA: Diagnosis not present

## 2020-10-03 DIAGNOSIS — R159 Full incontinence of feces: Secondary | ICD-10-CM

## 2020-10-03 DIAGNOSIS — D124 Benign neoplasm of descending colon: Secondary | ICD-10-CM

## 2020-10-03 DIAGNOSIS — K641 Second degree hemorrhoids: Secondary | ICD-10-CM

## 2020-10-03 DIAGNOSIS — D123 Benign neoplasm of transverse colon: Secondary | ICD-10-CM | POA: Diagnosis not present

## 2020-10-03 DIAGNOSIS — K5730 Diverticulosis of large intestine without perforation or abscess without bleeding: Secondary | ICD-10-CM

## 2020-10-03 MED ORDER — SODIUM CHLORIDE 0.9 % IV SOLN: 500.0000 mL | Freq: Once | INTRAVENOUS | Status: DC

## 2020-10-03 NOTE — Progress Notes (Signed)
Patient having difficulty passing air, abdomen is tense .  Levsin 0.25 mg given sublingual . Have had patient make side to side position changes with  abdominal massage. Patient currently sitting on commode.

## 2020-10-03 NOTE — Progress Notes (Signed)
Called to room to assist during endoscopic procedure.  Patient ID and intended procedure confirmed with present staff. Received instructions for my participation in the procedure from the performing physician.  

## 2020-10-03 NOTE — Progress Notes (Signed)
Pt's states no medical or surgical changes since previsit or office visit.  CW vitals and JD IV. 

## 2020-10-03 NOTE — Progress Notes (Signed)
1240-pt feeling bloated and abdomen is firm to the touch, sitting on toilet, pain is an 8, walk pt around , sits on toilet again, pt in no distress.  1250-pt feels some relief, walked pt around, warm tea given, went back to sit on the toilet at 1300. Update pt daughter, he is feeling better and to just have him drink warm liquids today until he started to feel better, then he can advance diet when gas pains are diminishing  1330--pt feeling better getting dressed, pt feels able to go home, instructed to drink warm liquids and advance diet after gas pains diminish, take gas - x or mylicon prn.

## 2020-10-03 NOTE — Progress Notes (Signed)
pt tolerated well. VSS. awake and to recovery. Report given to RN.  

## 2020-10-03 NOTE — Patient Instructions (Signed)
Thank you for allowing [place] to care for you today!  Await final results of the polyps removed, approximately 2 weeks.  Resume previous diet and medications today.  Return to your daily activities tomorrow.  Recommend use of daily fiber supplement  (Citrucel, Fibercon, Konsyl, or Metamucil) following directions on package.   YOU HAD AN ENDOSCOPIC PROCEDURE TODAY AT [facility]:   Refer to the procedure report that was given to you for any specific questions about what was found during the examination.  If the procedure report does not answer your questions, please call your gastroenterologist to clarify.  If you requested that your care partner not be given the details of your procedure findings, then the procedure report has been included in a sealed envelope for you to review at your convenience later.  YOU SHOULD EXPECT: Some feelings of bloating in the abdomen. Passage of more gas than usual.  Walking can help get rid of the air that was put into your GI tract during the procedure and reduce the bloating. If you had a lower endoscopy (such as a colonoscopy or flexible sigmoidoscopy) you may notice spotting of blood in your stool or on the toilet paper. If you underwent a bowel prep for your procedure, you may not have a normal bowel movement for a few days.  Please Note:  You might notice some irritation and congestion in your nose or some drainage.  This is from the oxygen used during your procedure.  There is no need for concern and it should clear up in a day or so.  SYMPTOMS TO REPORT IMMEDIATELY:   Following lower endoscopy (colonoscopy or flexible sigmoidoscopy):  Excessive amounts of blood in the stool  Significant tenderness or worsening of abdominal pains  Swelling of the abdomen that is new, acute  Fever of 100F or higher   For urgent or emergent issues, a gastroenterologist can be reached at any hour by calling [phone]. Do not use MyChart messaging for  urgent concerns.    DIET:  We do recommend a small meal at first, but then you may proceed to your regular diet.  Drink plenty of fluids but you should avoid alcoholic beverages for 24 hours.  ACTIVITY:  You should plan to take it easy for the rest of today and you should NOT DRIVE or use heavy machinery until tomorrow (because of the sedation medicines used during the test).    FOLLOW UP: Our staff will call the number listed on your records 48-72 hours following your procedure to check on you and address any questions or concerns that you may have regarding the information given to you following your procedure. If we do not reach you, we will leave a message.  We will attempt to reach you two times.  During this call, we will ask if you have developed any symptoms of COVID 19. If you develop any symptoms (ie: fever, flu-like symptoms, shortness of breath, cough etc.) before then, please call [phone].  If you test positive for Covid 19 in the 2 weeks post procedure, please call and report this information to [place].    If any biopsies were taken you will be contacted by phone or by letter within the next 1-3 weeks.  Please call us at [phone] if you have not heard about the biopsies in 3 weeks.    SIGNATURES/CONFIDENTIALITY: You and/or your care partner have signed paperwork which will be entered into your electronic medical record.  These signatures  attest to the fact that that the information above on your After Visit Summary has been reviewed and is understood.  Full responsibility of the confidentiality of this discharge information lies with you and/or your care-partner.

## 2020-10-03 NOTE — Op Note (Signed)
[facility] Patient Name: Donald Patel Procedure Date: 10/03/2020 11:16 AM MRN: [id number] Endoscopist: Gerrit Heck , MD Age: 80 Referring MD:  Date of Birth: [birth year] Gender: Male Account #: [id number] Procedure:                Colonoscopy Indications:              Surveillance: Personal history of adenomatous                            polyps on last colonoscopy 5 years ago                           ?"Colonoscopy (03/2012, [facility]): Diminutive cecal polyp (SSP), 1.5 cm                            ascending colon polyp resected piecemeal and                            tattooed (TA), diminutive transverse polyp (TA),                            medium-sized internal/external hemorrhoids, sigmoid                            diverticulosis. Recommend repeat in 6 months                           ?"Hemorrhoid banding at 3 separate sessions in                            05/2012                           ?"Colonoscopy (07/2012, Dr. Cindee Salt at [facility]): 7-8 mm polyp at hepatic flexure,                            cecal polyp, prior polypectomy site in ascending                            colon biopsy, Scar from prior hemorrhoid banding                            and distal rectal, sigmoid diverticulosis. No path                            review. Repeat in 3 years                           ?"Colonoscopy (11/2015, Dr. Cindee Salt at [facility]): 5 mm tubular adenoma in ascending                            colon, no residual polyp at prior descending colon                            polypectomy site, pandiverticulosis, external                            hemorrhoids. Scar from prior hemorrhoid banding in                            distal rectum. Recommended repeat in 5 years Medicines:                Monitored Anesthesia Care Procedure:                 Pre-Anesthesia Assessment:                           - Prior to the procedure, a History and Physical                            was performed, and patient medications and                            allergies were reviewed. The patient's tolerance of                            previous anesthesia was also reviewed. The risks                            and benefits of the procedure and the sedation                            options and risks were discussed with the patient.                            All questions were answered, and informed consent                            was obtained. Prior Anticoagulants: The patient has                            taken no previous anticoagulant or antiplatelet                            agents. ASA Grade Assessment: II - A patient with                            mild systemic disease. After reviewing the risks                            and benefits, the patient was deemed in  satisfactory condition to undergo the procedure.                           After obtaining informed consent, the colonoscope                            was passed under direct vision. Throughout the                            procedure, the patient's blood pressure, pulse, and                            oxygen saturations were monitored continuously. The                            Olympus CF-HQ190L ([id number]) Colonoscope was                            introduced through the anus and advanced to the the                            cecum, identified by appendiceal orifice and                            ileocecal valve. The colonoscopy was performed                            without difficulty. The patient tolerated the                            procedure well. The quality of the bowel                            preparation was good. The ileocecal valve,                            appendiceal orifice, and rectum were photographed. Scope In: 11:34:10  AM Scope Out: 11:54:49 AM Scope Withdrawal Time: 0 hours 16 minutes 26 seconds  Total Procedure Duration: 0 hours 20 minutes 39 seconds  Findings:                 Hemorrhoids were found on perianal exam.                           A tattoo was seen in the proximal ascending colon.                           A small post polypectomy scar was found in the                            proximal ascending colon in the area of the tattoo.                            There was very subtle 2-3 mm area of nodularity  adjacent to the scar site. Biopsies were taken with                            a cold forceps for histology. Estimated blood loss                            was minimal.                           Three sessile polyps were found in the sigmoid                            colon, descending colon and transverse colon. The                            polyps were 3 to 5 mm in size. These polyps were                            removed with a cold snare. Resection and retrieval                            were complete. Estimated blood loss was minimal.                           Multiple small and large-mouthed diverticula were                            found in the sigmoid colon.                           Non-bleeding internal hemorrhoids were found during                            retroflexion. The hemorrhoids were small. There                            were well healed scars from prior hemorrhoid                            bandings noted. Complications:            No immediate complications. Estimated Blood Loss:     Estimated blood loss was minimal. Impression:               - Hemorrhoids found on perianal exam.                           - A tattoo was seen in the proximal ascending colon.                           - Post-polypectomy scar in the proximal ascending                            colon. Biopsied.                           -  Three 3 to 5 mm polyps in the  sigmoid colon, in                            the descending colon and in the transverse colon,                            removed with a cold snare. Resected and retrieved.                           - Diverticulosis in the sigmoid colon.                           - Non-bleeding internal hemorrhoids. Recommendation:           - Patient has a contact number available for                            emergencies. The signs and symptoms of potential                            delayed complications were discussed with the                            patient. Return to normal activities tomorrow.                            Written discharge instructions were provided to the                            patient.                           - Resume previous diet.                           - Continue present medications.                           - Await pathology results.                           - Repeat colonoscopy for surveillance based on                            pathology results.                           - Return to GI clinic PRN.                           - Use fiber, for example Citrucel, Fibercon, Konsyl                            or Metamucil. Gerrit Heck, MD 10/03/2020 12:04:59 PM

## 2020-10-05 ENCOUNTER — Telehealth: Payer: Self-pay | Admitting: *Deleted

## 2020-10-05 NOTE — Telephone Encounter (Signed)
  Follow up Call-  Call back number 10/03/2020  Post procedure Call Back phone  # [phone]  Permission to leave phone message Yes  Some recent data might be hidden     Patient questions:  Do you have a fever, pain , or abdominal swelling? No. Pain Score  0 *  Have you tolerated food without any problems? Yes.    Have you been able to return to your normal activities? Yes.    Do you have any questions about your discharge instructions: Diet   No. Medications  No. Follow up visit  No.  Do you have questions or concerns about your Care? No.  Actions: * If pain score is 4 or above: No action needed, pain <4.

## 2020-10-18 ENCOUNTER — Encounter: Payer: Self-pay | Admitting: Gastroenterology

## 2020-11-09 ENCOUNTER — Other Ambulatory Visit: Payer: Self-pay

## 2020-11-09 ENCOUNTER — Ambulatory Visit (INDEPENDENT_AMBULATORY_CARE_PROVIDER_SITE_OTHER): Payer: Medicare Other | Admitting: Family Medicine

## 2020-11-09 ENCOUNTER — Encounter: Payer: Self-pay | Admitting: Family Medicine

## 2020-11-09 ENCOUNTER — Telehealth: Payer: Self-pay | Admitting: Family Medicine

## 2020-11-09 ENCOUNTER — Ambulatory Visit (INDEPENDENT_AMBULATORY_CARE_PROVIDER_SITE_OTHER): Payer: Medicare Other

## 2020-11-09 VITALS — BP 122/68 | HR 79 | Ht 70.0 in | Wt 146.0 lb

## 2020-11-09 DIAGNOSIS — I10 Essential (primary) hypertension: Secondary | ICD-10-CM

## 2020-11-09 DIAGNOSIS — J439 Emphysema, unspecified: Secondary | ICD-10-CM | POA: Insufficient documentation

## 2020-11-09 DIAGNOSIS — I700 Atherosclerosis of aorta: Secondary | ICD-10-CM | POA: Diagnosis not present

## 2020-11-09 DIAGNOSIS — R911 Solitary pulmonary nodule: Secondary | ICD-10-CM | POA: Diagnosis not present

## 2020-11-09 DIAGNOSIS — M791 Myalgia, unspecified site: Secondary | ICD-10-CM | POA: Diagnosis not present

## 2020-11-09 DIAGNOSIS — R519 Headache, unspecified: Secondary | ICD-10-CM

## 2020-11-09 DIAGNOSIS — R0789 Other chest pain: Secondary | ICD-10-CM

## 2020-11-09 DIAGNOSIS — E618 Deficiency of other specified nutrient elements: Secondary | ICD-10-CM | POA: Diagnosis not present

## 2020-11-09 DIAGNOSIS — E538 Deficiency of other specified B group vitamins: Secondary | ICD-10-CM | POA: Diagnosis not present

## 2020-11-09 HISTORY — DX: Emphysema, unspecified: J43.9

## 2020-11-09 NOTE — Telephone Encounter (Signed)
Please call patient.  I went back and looked at his old tests he did have a CT scan done of his chest back in 2019 and they did notice a couple of nodules we were supposed to repeat his scan in 6 months.  At the time I think he was good to follow back up with cardiology.  We need to get that scheduled for him as well I also want a go ahead and get an echocardiogram as I do not see one done in the last several years.

## 2020-11-09 NOTE — Assessment & Plan Note (Signed)
Blood pressure looks absolutely phenomenal today.  He did bring in his home cuff it does seem like he is getting a little bit higher pressures at home than what we are getting here.  But again it looks great.

## 2020-11-09 NOTE — Progress Notes (Signed)
Pt has been checking his bp at home is average has been: 135/71.  He checked it today in the office it was 151/80 p: 78

## 2020-11-09 NOTE — Progress Notes (Signed)
Established Patient Office Visit  Subjective:  Patient ID: Donald Patel, male    DOB: Oct 06, 1940  Age: 80 y.o. MRN: [id number]  CC:  Chief Complaint  Patient presents with  . Hypertension         HPI Donald Patel presents for   Hypertension- Pt denies chest pain, SOB, dizziness, or heart palpitations.  Taking meds as directed w/o problems.  Denies medication side effects.    Donald Patel says for about the last year ever since Donald Patel had his COVID-vaccine, [employer], Donald Patel is just not felt right.  Donald Patel has had a lot of pressure in his chest mostly in the upper anterior and posterior his posterior chest area no radiation down his arms.  No numbness or tingling in the arms no discoloration of the hands.  Donald Patel says it really just feels like a pressure Donald Patel denies any soreness or tenderness in the muscles.  Donald Patel denies any weakness of his arms or upper chest or shoulder area.  Donald Patel denies any significant breathing difficulty but does report that Donald Patel does occasionally get headaches and pressure in his head.  Sometimes Donald Patel also feels like his ears get stopped up.  Not necessarily at the same time.  Donald Patel has tried ibuprofen and Tylenol but says it does not seem to help at all.  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  Donald Patel does have a pacemaker and follows with cardiology regularly.  Known history of aortic atherosclerosis.  Not currently on a statin.  Donald Patel does have known emphysema seen on CT angio back in 2019.  Donald Patel is a former smoker.  Donald Patel was also noted to have several pulmonary nodules and recommendation was to have those followed up in about 6 months.  At that time Donald Patel just wanted to discuss further with cardiology and did not want to do any follow-up.  Donald Patel also reports that occasionally pills are getting stuck in his upper chest but Donald Patel says this is been going on for years.  Donald Patel says it never happens with food only with pills and so a lot of times  Donald Patel will crush them or even use liquids again this is not new.   Past Medical History:  Diagnosis Date  . AV BLOCK, COMPLETE    s/p PPM ([facility])  . Cataract both eyes corrected  . Chronic right-sided low back pain with right-sided sciatica 11/09/2019  . Emphysema lung ([place]) 11/09/2020  . ERECTILE DYSFUNCTION, ORGANIC   . History of colon polyps   . Hypertension   . Hypertension     Past Surgical History:  Procedure Laterality Date  . CATARACT EXTRACTION W/ INTRAOCULAR LENS IMPLANT  2013   right eye - [facility]  . CATARACT EXTRACTION W/ INTRAOCULAR LENS IMPLANT  2015   Left eye - [facility]  . COLONOSCOPY  11/29/2015   [facility]  . KNEE SURGERY    . PACEMAKER PLACEMENT  12/22/96   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, most recent generator ([facility]) 05/02/04 both implanted by Dr Rebecka Apley [facility]  . PERMANENT PACEMAKER GENERATOR CHANGE N/A 07/29/2013   Procedure: PERMANENT PACEMAKER GENERATOR CHANGE;  Surgeon: Coralyn Mark, MD;  Location: [facility];  Service: Cardiovascular;  Laterality: N/A;  . VASECTOMY      Family History  Problem Relation Age of Onset  . Breast cancer Mother   . Heart attack Brother   . Diabetes Other   .  Hodgkin's lymphoma Brother   . Colon cancer Neg Hx   . Esophageal cancer Neg Hx   . Rectal cancer Neg Hx   . Stomach cancer Neg Hx     Social History   Socioeconomic History  . Marital status: Married    Spouse name: Not on file  . Number of children: Not on file  . Years of education: Not on file  . Highest education level: Not on file  Occupational History  . Occupation: Retired  Tobacco Use  . Smoking status: Former Smoker    Packs/day: 1.50    Years: 35.00    Pack years: 52.50    Types: Cigarettes, Pipe, Cigars    Quit date: 06/10/1985    Years since quitting: 35.4  . Smokeless tobacco: Never Used  Substance and Sexual Activity  . Alcohol use: Yes    Alcohol/week: 14.0 standard drinks    Types: 14 Cans of beer per week  . Drug  use: No  . Sexual activity: Never  Other Topics Concern  . Not on file  Social History Narrative   Recently retired,Former Furniture conservator/restorer.  Exercise.    Social Determinants of Health   Financial Resource Strain: Not on file  Food Insecurity: Not on file  Transportation Needs: Not on file  Physical Activity: Not on file  Stress: Not on file  Social Connections: Not on file  Intimate Partner Violence: Not on file    Outpatient Medications Prior to Visit  Medication Sig Dispense Refill  . Cholecalciferol (VITAMIN D3) 25 MCG/SPRAY LIQD Take by mouth.    . hydrochlorothiazide (HYDRODIURIL) 25 MG tablet Take 1 tablet (25 mg total) by mouth daily. 90 tablet 3  . ibuprofen (ADVIL) 200 MG tablet Take 200 mg by mouth as needed.    Marland Kitchen losartan (COZAAR) 25 MG tablet TAKE 1 TABLET(25 MG) BY MOUTH DAILY 90 tablet 3  . Multiple Vitamins-Minerals (PRESERVISION AREDS 2 PO) Take by mouth as needed.    . Psyllium (METAMUCIL) 28.3 % POWD Take by mouth daily as needed.     No facility-administered medications prior to visit.    Allergies  Allergen Reactions  . Lisinopril Other (See Comments)    ROS Review of Systems    Objective:    Physical Exam Constitutional:      Appearance: Donald Patel is well-developed.  HENT:     Head: Normocephalic and atraumatic.  Cardiovascular:     Rate and Rhythm: Normal rate and regular rhythm.     Heart sounds: Normal heart sounds.  Pulmonary:     Effort: Pulmonary effort is normal.     Breath sounds: Normal breath sounds.  Musculoskeletal:     Right lower leg: No edema.     Left lower leg: No edema.  Skin:    General: Skin is warm and dry.  Neurological:     Mental Status: Donald Patel is alert and oriented to person, place, and time.  Psychiatric:        Behavior: Behavior normal.     BP 122/68 (Cuff Size: Normal)   Pulse 79   Ht 5\' 10"  (1.778 m)   Wt 146 lb (66.2 kg)   SpO2 98%   BMI 20.95 kg/m  Wt Readings from Last 3 Encounters:  11/09/20 146 lb (66.2 kg)   10/03/20 148 lb (67.1 kg)  09/19/20 146 lb (66.2 kg)     There are no preventive care reminders to display for this patient.  There are no preventive care reminders to display  for this patient.  Lab Results  Component Value Date   TSH 1.40 04/13/2019   Lab Results  Component Value Date   WBC 6.9 01/29/2018   HGB 16.2 01/29/2018   HCT 45.1 01/29/2018   MCV 91.1 01/29/2018   PLT 216 01/29/2018   Lab Results  Component Value Date   NA 139 05/11/2020   K 3.7 05/11/2020   CO2 25 05/11/2020   GLUCOSE 64 (L) 05/11/2020   BUN 13 05/11/2020   CREATININE 1.00 05/11/2020   BILITOT 1.1 05/11/2020   ALKPHOS 73 11/05/2016   AST 20 05/11/2020   ALT 17 05/11/2020   PROT 7.7 05/11/2020   ALBUMIN 4.2 11/05/2016   CALCIUM 9.8 05/11/2020   GFR 83.49 07/28/2013   Lab Results  Component Value Date   CHOL 218 (H) 05/11/2020   Lab Results  Component Value Date   HDL 73 05/11/2020   Lab Results  Component Value Date   LDLCALC 123 (H) 05/11/2020   Lab Results  Component Value Date   TRIG 112 05/11/2020   Lab Results  Component Value Date   CHOLHDL 3.0 05/11/2020   Lab Results  Component Value Date   HGBA1C 5.2 05/07/2016      Assessment & Plan:   Problem List Items Addressed This Visit      Cardiovascular and Mediastinum   Essential hypertension, benign - Primary    Blood pressure looks absolutely phenomenal today.  Donald Patel did bring in his home cuff it does seem like Donald Patel is getting a little bit higher pressures at home than what we are getting here.  But again it looks great.      Relevant Orders   BASIC METABOLIC PANEL WITH GFR   CK (Creatine Kinase)   Sedimentation rate   C-reactive protein   DG Chest 2 View   Alpha-1-antitrypsin   CBC with Differential/Platelet   B12 and Folate Panel   Vitamin B1   Vitamin B6   Aortic atherosclerosis (HCC)   Relevant Orders   BASIC METABOLIC PANEL WITH GFR   CK (Creatine Kinase)   Sedimentation rate   C-reactive protein    DG Chest 2 View   Alpha-1-antitrypsin   CBC with Differential/Platelet   B12 and Folate Panel   Vitamin B1   Vitamin B6     Respiratory   Emphysema lung (HCC)   Relevant Orders   CT Chest Wo Contrast     Other   Pulmonary nodule seen on imaging study   Relevant Orders   CT Chest Wo Contrast    Other Visit Diagnoses    Mineral deficiency       Relevant Orders   CBC with Differential/Platelet   B12 and Folate Panel   Vitamin B1   Vitamin B6   Atypical chest pain       Relevant Orders   BASIC METABOLIC PANEL WITH GFR   CK (Creatine Kinase)   Sedimentation rate   C-reactive protein   DG Chest 2 View   Alpha-1-antitrypsin   CBC with Differential/Platelet   B12 and Folate Panel   Vitamin B1   Vitamin B6   ECHOCARDIOGRAM COMPLETE   Myalgia       Relevant Orders   BASIC METABOLIC PANEL WITH GFR   CK (Creatine Kinase)   Sedimentation rate   C-reactive protein   DG Chest 2 View   Alpha-1-antitrypsin   CBC with Differential/Platelet   B12 and Folate Panel   Vitamin B1   Vitamin B6  Nonintractable episodic headache, unspecified headache type       Relevant Orders   BASIC METABOLIC PANEL WITH GFR   CK (Creatine Kinase)   Sedimentation rate   C-reactive protein   DG Chest 2 View   Alpha-1-antitrypsin   CBC with Differential/Platelet   B12 and Folate Panel   Vitamin B1   Vitamin B6      Atypical chest pain consider alternative diagnoses such as polymyalgia rheumatica we will check inflammatory markers as well as a CK level.  Also consider this could be related to underlying pulmonary disease Donald Patel has known emphysema seen on CT of the chest about 3 years ago Donald Patel also was noted to have some pulmonary nodules and never had follow-up testing.  We will plan to repeat that but will start with at least a plain film chest x-ray today.  Again Donald Patel denies any significant shortness of breath or cough symptoms.  His weight has been stable over the last couple of years.  Also  consider further cardiac work-up with stress test and echocardiogram.  Pulmonary nodules-recommend repeat CT.  Emphysema even though Donald Patel is not describing shortness of breath it still could be related to the pressure sensation that Donald Patel is getting.  Though Donald Patel sometimes feels like it is almost worse when Donald Patel is sitting which is a little unusual.  But consider further work-up with spirometry.  We helpful to get a baseline on him anyway.  Donald Patel did have colonoscopy done recently and they did remove 3 adenomatous polyps.  Donald Patel was having some fecal incontinence and was hoping to get some relief from that.  No orders of the defined types were placed in this encounter.   Follow-up: No follow-ups on file.   I spent 42 minutes on the day of the encounter to include pre-visit record review, face-to-face time with the patient and post visit ordering of test.   Beatrice Lecher, MD

## 2020-11-09 NOTE — Telephone Encounter (Signed)
Pt advised, he is fine with moving forward with whatever scans/testing that is needed.

## 2020-11-10 ENCOUNTER — Ambulatory Visit (INDEPENDENT_AMBULATORY_CARE_PROVIDER_SITE_OTHER): Payer: Medicare Other

## 2020-11-10 DIAGNOSIS — R079 Chest pain, unspecified: Secondary | ICD-10-CM | POA: Diagnosis not present

## 2020-11-10 DIAGNOSIS — J432 Centrilobular emphysema: Secondary | ICD-10-CM | POA: Diagnosis not present

## 2020-11-10 DIAGNOSIS — R911 Solitary pulmonary nodule: Secondary | ICD-10-CM | POA: Diagnosis not present

## 2020-11-10 DIAGNOSIS — I700 Atherosclerosis of aorta: Secondary | ICD-10-CM | POA: Diagnosis not present

## 2020-11-10 DIAGNOSIS — J439 Emphysema, unspecified: Secondary | ICD-10-CM

## 2020-11-10 DIAGNOSIS — M549 Dorsalgia, unspecified: Secondary | ICD-10-CM

## 2020-11-10 DIAGNOSIS — R918 Other nonspecific abnormal finding of lung field: Secondary | ICD-10-CM | POA: Diagnosis not present

## 2020-11-15 LAB — BASIC METABOLIC PANEL WITH GFR
BUN: 13 mg/dL (ref 7–25)
CO2: 31 mmol/L (ref 20–32)
Calcium: 9.8 mg/dL (ref 8.6–10.3)
Chloride: 99 mmol/L (ref 98–110)
Creat: 1.01 mg/dL (ref 0.70–1.11)
GFR, Est African American: 81 mL/min/{1.73_m2} (ref >=60)
GFR, Est Non African American: 70 mL/min/{1.73_m2} (ref >=60)
Glucose, Bld: 103 mg/dL — ABNORMAL HIGH (ref 65–99)
Potassium: 3.9 mmol/L (ref 3.5–5.3)
Sodium: 138 mmol/L (ref 135–146)

## 2020-11-15 LAB — CBC WITH DIFFERENTIAL/PLATELET
Absolute Monocytes: 806 cells/uL (ref 200–950)
Basophils Absolute: 62 cells/uL (ref 0–200)
Basophils Relative: 1 %
Eosinophils Absolute: 211 cells/uL (ref 15–500)
Eosinophils Relative: 3.4 %
HCT: 47.6 % (ref 38.5–50.0)
Hemoglobin: 16.4 g/dL (ref 13.2–17.1)
Lymphs Abs: 1128 cells/uL (ref 850–3900)
MCH: 32.3 pg (ref 27.0–33.0)
MCHC: 34.5 g/dL (ref 32.0–36.0)
MCV: 93.7 fL (ref 80.0–100.0)
MPV: 11.9 fL (ref 7.5–12.5)
Monocytes Relative: 13 %
Neutro Abs: 3993 cells/uL (ref 1500–7800)
Neutrophils Relative %: 64.4 %
Platelets: 198 10*3/uL (ref 140–400)
RBC: 5.08 10*6/uL (ref 4.20–5.80)
RDW: 12.4 % (ref 11.0–15.0)
Total Lymphocyte: 18.2 %
WBC: 6.2 10*3/uL (ref 3.8–10.8)

## 2020-11-15 LAB — C-REACTIVE PROTEIN: CRP: 3.3 mg/L (ref <8.0)

## 2020-11-15 LAB — B12 AND FOLATE PANEL
Folate: 11.6 ng/mL
Vitamin B-12: 268 pg/mL (ref 200–1100)

## 2020-11-15 LAB — VITAMIN B1: Vitamin B1 (Thiamine): 13 nmol/L (ref 8–30)

## 2020-11-15 LAB — ALPHA-1-ANTITRYPSIN: A-1 Antitrypsin, Ser: 179 mg/dL (ref 83–199)

## 2020-11-15 LAB — CK: Total CK: 66 U/L (ref 44–196)

## 2020-11-15 LAB — SEDIMENTATION RATE: Sed Rate: 9 mm/h (ref 0–20)

## 2020-11-15 LAB — VITAMIN B6: Vitamin B6: 10.7 ng/mL (ref 2.1–21.7)

## 2020-11-19 ENCOUNTER — Encounter: Payer: Self-pay | Admitting: Family Medicine

## 2020-11-30 ENCOUNTER — Ambulatory Visit (INDEPENDENT_AMBULATORY_CARE_PROVIDER_SITE_OTHER): Payer: Medicare Other

## 2020-11-30 DIAGNOSIS — I442 Atrioventricular block, complete: Secondary | ICD-10-CM

## 2020-11-30 LAB — CUP PACEART REMOTE DEVICE CHECK
Battery Remaining Longevity: 29 mo
Battery Remaining Percentage: 25 %
Battery Voltage: 2.9 V
Brady Statistic AP VP Percent: 12 %
Brady Statistic AP VS Percent: 1 %
Brady Statistic AS VP Percent: 88 %
Brady Statistic AS VS Percent: 1 %
Brady Statistic RA Percent Paced: 11 %
Brady Statistic RV Percent Paced: 99 %
Date Time Interrogation Session: 20220623020014
Implantable Lead Implant Date: 20051123
Implantable Lead Implant Date: 20051123
Implantable Lead Location: 753859
Implantable Lead Location: 753860
Implantable Pulse Generator Implant Date: 20150219
Lead Channel Impedance Value: 430 Ohm
Lead Channel Impedance Value: 480 Ohm
Lead Channel Pacing Threshold Amplitude: 0.5 V
Lead Channel Pacing Threshold Amplitude: 1.5 V
Lead Channel Pacing Threshold Pulse Width: 0.5 ms
Lead Channel Pacing Threshold Pulse Width: 0.5 ms
Lead Channel Sensing Intrinsic Amplitude: 11.2 mV
Lead Channel Sensing Intrinsic Amplitude: 4.8 mV
Lead Channel Setting Pacing Amplitude: 1.75 V
Lead Channel Setting Pacing Amplitude: 2 V
Lead Channel Setting Pacing Pulse Width: 0.5 ms
Lead Channel Setting Sensing Sensitivity: 5 mV
Pulse Gen Model: 2240
Pulse Gen Serial Number: 7596156

## 2020-12-06 ENCOUNTER — Other Ambulatory Visit: Payer: Self-pay

## 2020-12-06 ENCOUNTER — Ambulatory Visit (HOSPITAL_BASED_OUTPATIENT_CLINIC_OR_DEPARTMENT_OTHER)
Admission: RE | Admit: 2020-12-06 | Discharge: 2020-12-06 | Disposition: A | Discharge status: Home / Self Care | Payer: Medicare Other | Source: Ambulatory Visit | Attending: Family Medicine | Admitting: Family Medicine

## 2020-12-06 DIAGNOSIS — R0789 Other chest pain: Secondary | ICD-10-CM | POA: Insufficient documentation

## 2020-12-06 LAB — ECHOCARDIOGRAM COMPLETE
AR max vel: 3.33 cm2
AV Area VTI: 3.17 cm2
AV Area mean vel: 3.18 cm2
AV Mean grad: 3 mmHg
AV Peak grad: 5.1 mmHg
Ao pk vel: 1.13 m/s
Area-P 1/2: 1.55 cm2
Calc EF: 46.6 %
S' Lateral: 3.09 cm
Single Plane A2C EF: 44.6 %
Single Plane A4C EF: 48.8 %

## 2020-12-06 NOTE — Progress Notes (Signed)
*  PRELIMINARY RESULTS* Echocardiogram 2D Echocardiogram has been performed.  Luisa Hart RDCS 12/06/2020, 12:55 PM

## 2020-12-19 NOTE — Progress Notes (Signed)
Remote pacemaker transmission.   

## 2020-12-26 ENCOUNTER — Telehealth: Payer: Self-pay | Admitting: Internal Medicine

## 2020-12-26 ENCOUNTER — Telehealth: Payer: Self-pay | Admitting: *Deleted

## 2020-12-26 DIAGNOSIS — I77819 Aortic ectasia, unspecified site: Secondary | ICD-10-CM

## 2020-12-26 DIAGNOSIS — R0789 Other chest pain: Secondary | ICD-10-CM

## 2020-12-26 NOTE — Telephone Encounter (Signed)
Gave advisement regarding referral and echo results. The patient verbalized understanding.

## 2020-12-26 NOTE — Telephone Encounter (Signed)
Patient has question's regarding why a referral was placed to general cardiology from Dr. Rayann Heman. Please advise.

## 2020-12-26 NOTE — Telephone Encounter (Signed)
-----   Message from [name], MD sent at 12/23/2020  5:09 PM EDT ----- Results reviewed.  Donald Patel, please refer him to the general cardiology team for further evaluation and management.

## 2021-01-03 NOTE — Progress Notes (Deleted)
Cardiology Office Note:    Date:  01/03/2021   ID:  Donald Patel, DOB 01/26/1941, MRN [id number]  PCP:  Hali Marry, MD  Cardiologist:  [name], MD  Electrophysiologist:  [name], MD   Referring MD: [name], MD   No chief complaint on file. ***  History of Present Illness:    Donald Patel is a 80 y.o. male with a hx of complete heart block status post PPM, hypertension, COPD who presents for follow-up.  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  Device interrogation on 11/30/2020 showed 99% RV pacing.  Past Medical History:  Diagnosis Date   AV BLOCK, COMPLETE    s/p PPM (SJM)   Cataract both eyes corrected   Chronic right-sided low back pain with right-sided sciatica 11/09/2019   Emphysema lung ([place]) 11/09/2020   ERECTILE DYSFUNCTION, ORGANIC    History of colon polyps    Hypertension    Hypertension     Past Surgical History:  Procedure Laterality Date   CATARACT EXTRACTION W/ INTRAOCULAR LENS IMPLANT  2013   right eye - [facility]   CATARACT EXTRACTION W/ INTRAOCULAR LENS IMPLANT  2015   Left eye - [place]   COLONOSCOPY  11/29/2015   [facility]   KNEE SURGERY     PACEMAKER PLACEMENT  12/22/96   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, most recent generator (SJM) 05/02/04 both implanted by Dr Rebecka Aple[facility]   PERMANENT PACEMAKER GENERATOR CHANGE N/A 07/29/2013   Procedure: PERMANENT PACEMAKER GENERATOR CHANGE;  Surgeon: Coralyn Mark, MD;  Location: [place] CATH LAB;  Service: Cardiovascular;  Laterality: N/A;   VASECTOMY      Current Medications: No outpatient medications have been marked as taking for the 01/04/21 encounter (Appointment) with Donato Heinz, MD.     Allergies:   Lisinopril   Social History   Socioeconomic History   Marital status: Married    Spouse name: Not on file   Number of children: Not on file    Years of education: Not on file   Highest education level: Not on file  Occupational History   Occupation: Retired  Tobacco Use   Smoking status: Former    Packs/day: 1.50    Years: 35.00    Pack years: 52.50    Types: Cigarettes, Pipe, Cigars    Quit date: 06/10/1985    Years since quitting: 35.5   Smokeless tobacco: Never  Substance and Sexual Activity   Alcohol use: Yes    Alcohol/week: 14.0 standard drinks    Types: 14 Cans of beer per week   Drug use: No   Sexual activity: Never  Other Topics Concern   Not on file  Social History Narrative   Recently retired,Former Furniture conservator/restorer.  Exercise.    Social Determinants of Health   Financial Resource Strain: Not on file  Food Insecurity: Not on file  Transportation Needs: Not on file  Physical Activity: Not on file  Stress: Not on file  Social Connections: Not on file     Family History: The patient's ***family history includes Breast cancer in his mother; Diabetes in an other family member; Heart attack in his brother; Hodgkin's lymphoma in his brother. There is no history of Colon cancer, Esophageal cancer, Rectal cancer, or Stomach cancer.  ROS:   Please see the history of present illness.    *** All other systems reviewed and are  negative.  EKGs/Labs/Other Studies Reviewed:    The following studies were reviewed today: ***  EKG:  EKG is *** ordered today.  The ekg ordered today demonstrates ***  Recent Labs: 05/11/2020: ALT 17 11/09/2020: BUN 13; Creat 1.01; Hemoglobin 16.4; Platelets 198; Potassium 3.9; Sodium 138  Recent Lipid Panel    Component Value Date/Time   CHOL 218 (H) 05/11/2020 0000   TRIG 112 05/11/2020 0000   HDL 73 05/11/2020 0000   CHOLHDL 3.0 05/11/2020 0000   VLDL 17 11/13/2015 0830   LDLCALC 123 (H) 05/11/2020 0000    Physical Exam:    VS:  There were no vitals taken for this visit.    Wt Readings from Last 3 Encounters:  11/09/20 146 lb (66.2 kg)  10/03/20 148 lb (67.1 kg)  09/19/20  146 lb (66.2 kg)     GEN: *** Well nourished, well developed in no acute distress HEENT: Normal NECK: No JVD; No carotid bruits LYMPHATICS: No lymphadenopathy CARDIAC: ***RRR, no murmurs, rubs, gallops RESPIRATORY:  Clear to auscultation without rales, wheezing or rhonchi  ABDOMEN: Soft, non-tender, non-distended MUSCULOSKELETAL:  No edema; No deformity  SKIN: Warm and dry NEUROLOGIC:  Alert and oriented x 3 PSYCHIATRIC:  Normal affect   ASSESSMENT:    No diagnosis found. PLAN:    Chronic combined systolic and diastolic heart failure: Recent diagnosis, echo with EF 40 to 45%. -Appears euvolemic -Recommend ischemia evaluation.  Discussed coronary CTA versus catheterization, he would prefer noninvasive approach.  Had recent CT chest without coronary calcifications, I think would be a good candidate for coronary CTA.  We will proceed with coronary CTA -Continue losartan 25 mg daily -Start carvedilol 6.25 mg twice daily -Follow-up in pharmacy heart failure clinic in 2 weeks to continue to titrate heart failure medications   Hypertension: On losartan 25 mg daily and hydrochlorothiazide 25 mg daily.  BP elevated in clinic today, adding carvedilol as above  RTC in 2 months   Medication Adjustments/Labs and Tests Ordered: Current medicines are reviewed at length with the patient today.  Concerns regarding medicines are outlined above.  No orders of the defined types were placed in this encounter.  No orders of the defined types were placed in this encounter.   There are no Patient Instructions on file for this visit.   Signed, Donato Heinz, MD  01/03/2021 8:54 PM    [place]

## 2021-01-04 ENCOUNTER — Other Ambulatory Visit: Payer: Self-pay

## 2021-01-04 ENCOUNTER — Ambulatory Visit (HOSPITAL_BASED_OUTPATIENT_CLINIC_OR_DEPARTMENT_OTHER): Payer: Medicare Other | Admitting: Cardiology

## 2021-01-04 VITALS — BP 158/80 | HR 60 | Ht 70.0 in | Wt 147.8 lb

## 2021-01-04 DIAGNOSIS — R079 Chest pain, unspecified: Secondary | ICD-10-CM | POA: Diagnosis not present

## 2021-01-04 DIAGNOSIS — I5042 Chronic combined systolic (congestive) and diastolic (congestive) heart failure: Secondary | ICD-10-CM

## 2021-01-04 DIAGNOSIS — Z13220 Encounter for screening for lipoid disorders: Secondary | ICD-10-CM | POA: Diagnosis not present

## 2021-01-04 MED ORDER — CARVEDILOL 6.25 MG PO TABS: 6.2500 mg | ORAL_TABLET | Freq: Two times a day (BID) | ORAL | 3 refills | Status: DC

## 2021-01-04 NOTE — Patient Instructions (Addendum)
Medication Instructions:  START carvedilol (Coreg) 6.25 mg two times daily  *If you need a refill on your cardiac medications before your next appointment, please call your pharmacy*   Lab Work: BMET, Lipid  (prior to CT scan)  If you have labs (blood work) drawn today and your tests are completely normal, you will receive your results only by: [place] (if you have MyChart) OR A paper copy in the mail If you have any lab test that is abnormal or we need to change your treatment, we will call you to review the results.   Testing/Procedures: Coronary CTA-see instructions below  Follow-Up: At [facility], you and your health needs are our priority.  As part of our continuing mission to provide you with exceptional heart care, we have created designated Provider Care Teams.  These Care Teams include your primary Cardiologist (physician) and Advanced Practice Providers (APPs -  Physician Assistants and Nurse Practitioners) who all work together to provide you with the care you need, when you need it.  We recommend signing up for the patient portal called "MyChart".  Sign up information is provided on this After Visit Summary.  MyChart is used to connect with patients for Virtual Visits (Telemedicine).  Patients are able to view lab/test results, encounter notes, upcoming appointments, etc.  Non-urgent messages can be sent to your provider as well.   To learn more about what you can do with MyChart, go to [email or web address].    Your next appointment:   2 weeks with pharmacist (HF med titration) 2 months with Dr. Gardiner Rhyme  Other Instructions   Your cardiac CT will be scheduled at one of the below locations:   [facility] [address] [place], [place] [address] [phone]   If scheduled at [facility], please arrive at the [facility] main entrance (entrance A) of [facility] 30 minutes prior to test start time. Proceed to the [facility] Radiology Department (first floor) to check-in and test prep.  Please follow these instructions carefully (unless otherwise directed):  Hold all erectile dysfunction medications at least 3 days (72 hrs) prior to test.  On the Night Before the Test: Be sure to Drink plenty of water. Do not consume any caffeinated/decaffeinated beverages or chocolate 12 hours prior to your test. Do not take any antihistamines 12 hours prior to your test.  On the Day of the Test: Drink plenty of water until 1 hour prior to the test. Do not eat any food 4 hours prior to the test. You may take your regular medications prior to the test.  HOLD Hydrochlorothiazide morning of the test.      After the Test: Drink plenty of water. After receiving IV contrast, you may experience a mild flushed feeling. This is normal. On occasion, you may experience a mild rash up to 24 hours after the test. This is not dangerous. If this occurs, you can take Benadryl 25 mg and increase your fluid intake. If you experience trouble breathing, this can be serious. If it is severe call 911 IMMEDIATELY. If it is mild, please call our office. If you take any of these medications: Glipizide/Metformin, Avandament, Glucavance, please do not take 48 hours after completing test unless otherwise instructed.  Please allow 2-4 weeks for scheduling of routine cardiac CTs. Some insurance companies require a pre-authorization which may delay scheduling of this test.   For non-scheduling related questions, please contact the cardiac imaging nurse navigator should you have any  questions/concerns: Marchia Bond, Cardiac Imaging Nurse Navigator Gordy Clement, Cardiac Imaging Nurse Navigator [facility] Direct Office Dial: [phone]   For scheduling needs, including cancellations and rescheduling, please call [place], [phone].

## 2021-01-04 NOTE — Progress Notes (Signed)
Cardiology Office Note:    Date:  01/04/2021   ID:  KORION NGO, DOB [birth year], MRN [id number]  PCP:  Hali Marry, MD  Cardiologist:  [name], MD  Electrophysiologist:  [name], MD   Referring MD: [name], MD   Chief Complaint  Patient presents with   Congestive Heart Failure    History of Present Illness:    Donald Patel is a 80 y.o. male with a hx of complete heart block status post PPM, hypertension, COPD, paroxysmal atrial fibrillation, recently diagnosed heart failure who presents for follow-up. 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  Device interrogation on 11/30/2020 showed 99% RV pacing. He was referred by Dr.Allred for an evaluation of heart failure. He says since his johnson&johnson vaccine (09/20/2019), he started experiencing persistent chest pains. The chest pains are located on both sides of chest and feels like pressure. He rates the pain 6/10.  Reports chest pain occurs all the time, has not noticed anything that makes it better or worse.  He also has shortness of breath when overexerting himself, and lower back and right knee pain.  Denies any syncope. Denies LE edema or palpitations.  Does not exercise due to knee pain.  He smoked a pack of cigarettes a day for 30 years but has stopped 33 years ago. His brother had MS and died of a heart attack at 81 yo.  Past Medical History:  Diagnosis Date   AV BLOCK, COMPLETE    s/p PPM (SJM)   Cataract both eyes corrected   Chronic right-sided low back pain with right-sided sciatica 11/09/2019   Emphysema lung ([place]) 11/09/2020   ERECTILE DYSFUNCTION, ORGANIC    History of colon polyps    Hypertension    Hypertension     Past Surgical History:  Procedure Laterality Date   CATARACT EXTRACTION W/ INTRAOCULAR LENS IMPLANT  2013   right eye - [facility]   CATARACT EXTRACTION W/ INTRAOCULAR LENS IMPLANT   2015   Left eye - [place]   COLONOSCOPY  11/29/2015   [facility]   KNEE SURGERY     PACEMAKER PLACEMENT  12/22/96   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, most recent generator (SJM) 05/02/04 both implanted by Dr Rebecka [facility]ple[facility]   PERMANENT PACEMAKER GENERATOR CHANGE N/A 07/29/2013   Procedure: PERMANENT PACEMAKER GENERATOR CHANGE;  Surgeon: Coralyn Mark, MD;  Location: [place] CATH LAB;  Service: Cardiovascular;  Laterality: N/A;   VASECTOMY      Current Medications: Current Meds  Medication Sig   carvedilol (COREG) 6.25 MG tablet Take 1 tablet (6.25 mg total) by mouth 2 (two) times daily.   Cholecalciferol (VITAMIN D3) 25 MCG/SPRAY LIQD Take by mouth.   hydrochlorothiazide (HYDRODIURIL) 25 MG tablet Take 1 tablet (25 mg total) by mouth daily.   ibuprofen (ADVIL) 200 MG tablet Take 200 mg by mouth as needed.   losartan (COZAAR) 25 MG tablet TAKE 1 TABLET(25 MG) BY MOUTH DAILY   Multiple Vitamins-Minerals (PRESERVISION AREDS 2 PO) Take by mouth as needed.   Psyllium (METAMUCIL) 28.3 % POWD Take by mouth daily as needed.     Allergies:   Lisinopril   Social History   Socioeconomic History   Marital status: Married    Spouse name: Not on file   Number of children: Not on file   Years of education: Not on file   Highest education level: Not  on file  Occupational History   Occupation: Retired  Tobacco Use   Smoking status: Former    Packs/day: 1.50    Years: 35.00    Pack years: 52.50    Types: Cigarettes, Pipe, Cigars    Quit date: 06/10/1985    Years since quitting: 35.5   Smokeless tobacco: Never  Substance and Sexual Activity   Alcohol use: Yes    Alcohol/week: 14.0 standard drinks    Types: 14 Cans of beer per week   Drug use: No   Sexual activity: Never  Other Topics Concern   Not on file  Social History Narrative   Recently retired,Former Furniture conservator/restorer.  Exercise.    Social Determinants of Health   Financial Resource Strain: Not on file  Food  Insecurity: Not on file  Transportation Needs: Not on file  Physical Activity: Not on file  Stress: Not on file  Social Connections: Not on file     Family History: The patient's family history includes Breast cancer in his mother; Diabetes in an other family member; Heart attack in his brother; Hodgkin's lymphoma in his brother. There is no history of Colon cancer, Esophageal cancer, Rectal cancer, or Stomach cancer.  ROS:   Please see the history of present illness.    (+) chest pains, located both sides of chest  (+) lower back pain (+) right knee pain  (+) shortness of breath   All other systems reviewed and are negative.  EKGs/Labs/Other Studies Reviewed:    The following studies were reviewed today: ECHO 06/22:  IMPRESSIONS    1. Left ventricular ejection fraction, by estimation, is 40 to 45%. The  left ventricle has mildly decreased function. The left ventricle has no  regional wall motion abnormalities. Left ventricular diastolic parameters  are indeterminate.   2. Right ventricular systolic function is normal. The right ventricular  size is normal.   3. The mitral valve is normal in structure. No evidence of mitral valve  regurgitation. No evidence of mitral stenosis.   4. The aortic valve is normal in structure. Aortic valve regurgitation is  trivial. No aortic stenosis is present.   5. Aortic dilatation noted. There is dilatation of the aortic root,  measuring 40 mm.   6. The inferior vena cava is normal in size with greater than 50%  respiratory variability, suggesting right atrial pressure of 3 mmHg.   Comparison(s): No prior Echocardiogram.   EKG:  07/22: AV pace rhythm, rate 60  Recent Labs: 05/11/2020: ALT 17 11/09/2020: BUN 13; Creat 1.01; Hemoglobin 16.4; Platelets 198; Potassium 3.9; Sodium 138  Recent Lipid Panel    Component Value Date/Time   CHOL 218 (H) 05/11/2020 0000   TRIG 112 05/11/2020 0000   HDL 73 05/11/2020 0000   CHOLHDL 3.0 05/11/2020  0000   VLDL 17 11/13/2015 0830   LDLCALC 123 (H) 05/11/2020 0000    Physical Exam:    VS:  BP (!) 158/80   Pulse 60   Ht '5\' 10"'$  (1.778 m)   Wt 147 lb 12.8 oz (67 kg)   BMI 21.21 kg/m     Wt Readings from Last 3 Encounters:  01/04/21 147 lb 12.8 oz (67 kg)  11/09/20 146 lb (66.2 kg)  10/03/20 148 lb (67.1 kg)     GEN:  Well nourished, well developed in no acute distress HEENT: Normal NECK: No JVD; No carotid bruits LYMPHATICS: No lymphadenopathy CARDIAC: RRR, no murmurs, rubs, gallops RESPIRATORY:  Clear to auscultation without rales, wheezing or  rhonchi  ABDOMEN: Soft, non-tender, non-distended MUSCULOSKELETAL:  No edema; No deformity  SKIN: Warm and dry NEUROLOGIC:  Alert and oriented x 3 PSYCHIATRIC:  Normal affect   ASSESSMENT:    1. Chronic combined systolic (congestive) and diastolic (congestive) heart failure (HCC)   2. Chest pain of uncertain etiology   3. Lipid screening    PLAN:    Chronic combined systolic and diastolic heart failure: Recent diagnosis, echo with EF 40 to 45%.   -Appears euvolemic -Recommend ischemia evaluation.  Discussed coronary CTA versus catheterization, he would prefer noninvasive approach.  Had recent CT chest without coronary calcifications and baseline HR is AV paced at 60bpm, I think would be a good candidate for coronary CTA.  Will proceed with coronary CTA -Continue losartan 25 mg daily -Start carvedilol 6.25 mg twice daily -Follow-up in pharmacy heart failure clinic in 2 weeks to continue to titrate heart failure medications  Hypertension: On losartan 25 mg daily and hydrochlorothiazide 25 mg daily.  BP elevated in clinic today, adding carvedilol as above  Lipid screening: Check lipid panel  RTC in 2 months  Medication Adjustments/Labs and Tests Ordered: Current medicines are reviewed at length with the patient today.  Concerns regarding medicines are outlined above.  Orders Placed This Encounter  Procedures   CT CORONARY  MORPH W/CTA COR W/SCORE W/CA W/CM &/OR WO/CM   Basic metabolic panel   Lipid panel   EKG 12-Lead   Meds ordered this encounter  Medications   carvedilol (COREG) 6.25 MG tablet    Sig: Take 1 tablet (6.25 mg total) by mouth 2 (two) times daily.    Dispense:  180 tablet    Refill:  3    Patient Instructions  Medication Instructions:  START carvedilol (Coreg) 6.25 mg two times daily  *If you need a refill on your cardiac medications before your next appointment, please call your pharmacy*   Lab Work: BMET, Lipid  (prior to CT scan)  If you have labs (blood work) drawn today and your tests are completely normal, you will receive your results only by: [place] (if you have MyChart) OR A paper copy in the mail If you have any lab test that is abnormal or we need to change your treatment, we will call you to review the results.   Testing/Procedures: Coronary CTA-see instructions below  Follow-Up: At [facility], you and your health needs are our priority.  As part of our continuing mission to provide you with exceptional heart care, we have created designated Provider Care Teams.  These Care Teams include your primary Cardiologist (physician) and Advanced Practice Providers (APPs -  Physician Assistants and Nurse Practitioners) who all work together to provide you with the care you need, when you need it.  We recommend signing up for the patient portal called "MyChart".  Sign up information is provided on this After Visit Summary.  MyChart is used to connect with patients for Virtual Visits (Telemedicine).  Patients are able to view lab/test results, encounter notes, upcoming appointments, etc.  Non-urgent messages can be sent to your provider as well.   To learn more about what you can do with MyChart, go to [email or web address].    Your next appointment:   2 weeks with pharmacist (HF med titration) 2 months with Dr. Gardiner Rhyme  Other Instructions   Your cardiac CT  will be scheduled at one of the below locations:   [facility] [address] [place], [place] [address] [phone]  If scheduled at [facility], please arrive at the [facility] main entrance (entrance A) of [facility] 30 minutes prior to test start time. Proceed to the [facility] Radiology Department (first floor) to check-in and test prep.  Please follow these instructions carefully (unless otherwise directed):  Hold all erectile dysfunction medications at least 3 days (72 hrs) prior to test.  On the Night Before the Test: Be sure to Drink plenty of water. Do not consume any caffeinated/decaffeinated beverages or chocolate 12 hours prior to your test. Do not take any antihistamines 12 hours prior to your test.  On the Day of the Test: Drink plenty of water until 1 hour prior to the test. Do not eat any food 4 hours prior to the test. You may take your regular medications prior to the test.  HOLD Hydrochlorothiazide morning of the test.      After the Test: Drink plenty of water. After receiving IV contrast, you may experience a mild flushed feeling. This is normal. On occasion, you may experience a mild rash up to 24 hours after the test. This is not dangerous. If this occurs, you can take Benadryl 25 mg and increase your fluid intake. If you experience trouble breathing, this can be serious. If it is severe call 911 IMMEDIATELY. If it is mild, please call our office. If you take any of these medications: Glipizide/Metformin, Avandament, Glucavance, please do not take 48 hours after completing test unless otherwise instructed.  Please allow 2-4 weeks for scheduling of routine cardiac CTs. Some insurance companies require a pre-authorization which may delay scheduling of this test.   For non-scheduling related questions, please contact the cardiac imaging nurse navigator should you have any questions/concerns: Marchia Bond, Cardiac Imaging Nurse  Navigator Gordy Clement, Cardiac Imaging Nurse Navigator [facility] Direct Office Dial: [phone]   For scheduling needs, including cancellations and rescheduling, please call [place], [phone].    I,Jada Bradford,acting as a Education administrator for Donato Heinz, MD.,have documented all relevant documentation on the behalf of Donato Heinz, MD,as directed by  Donato Heinz, MD while in the presence of Donato Heinz, MD.  I, Donato Heinz, MD, have reviewed all documentation for this visit. The documentation on 01/04/21 for the exam, diagnosis, procedures, and orders are all accurate and complete.  Signed, Donato Heinz, MD  01/04/2021 6:13 PM    [facility]

## 2021-01-05 ENCOUNTER — Encounter: Payer: Self-pay | Admitting: Family Medicine

## 2021-01-05 DIAGNOSIS — J439 Emphysema, unspecified: Secondary | ICD-10-CM

## 2021-01-08 NOTE — Telephone Encounter (Signed)
Okay to place referral to pulm as below.

## 2021-01-09 DIAGNOSIS — I5020 Unspecified systolic (congestive) heart failure: Secondary | ICD-10-CM | POA: Diagnosis not present

## 2021-01-09 DIAGNOSIS — R079 Chest pain, unspecified: Secondary | ICD-10-CM | POA: Diagnosis not present

## 2021-01-09 DIAGNOSIS — Z13220 Encounter for screening for lipoid disorders: Secondary | ICD-10-CM | POA: Diagnosis not present

## 2021-01-09 LAB — BASIC METABOLIC PANEL
BUN/Creatinine Ratio: 13 (ref 10–24)
BUN: 13 mg/dL (ref 8–27)
CO2: 24 mmol/L (ref 20–29)
Calcium: 9.7 mg/dL (ref 8.6–10.2)
Chloride: 97 mmol/L (ref 96–106)
Creatinine, Ser: 1.03 mg/dL (ref 0.76–1.27)
Glucose: 96 mg/dL (ref 65–99)
Potassium: 3.9 mmol/L (ref 3.5–5.2)
Sodium: 139 mmol/L (ref 134–144)
eGFR: 73 mL/min/{1.73_m2} (ref >59)

## 2021-01-09 LAB — LIPID PANEL
Chol/HDL Ratio: 3.3 ratio (ref 0.0–5.0)
Cholesterol, Total: 230 mg/dL — ABNORMAL HIGH (ref 100–199)
HDL: 69 mg/dL (ref >39)
LDL Chol Calc (NIH): 139 mg/dL — ABNORMAL HIGH (ref 0–99)
Triglycerides: 128 mg/dL (ref 0–149)
VLDL Cholesterol Cal: 22 mg/dL (ref 5–40)

## 2021-01-12 ENCOUNTER — Telehealth (HOSPITAL_COMMUNITY): Payer: Self-pay | Admitting: Emergency Medicine

## 2021-01-12 NOTE — Telephone Encounter (Signed)
Reaching out to patient to offer assistance regarding upcoming cardiac imaging study; pt verbalizes understanding of appt date/time, parking situation and where to check in, pre-test NPO status and medications ordered, and verified current allergies; name and call back number provided for further questions should they arise Marchia Bond RN Navigator Cardiac Imaging [facility] [phone] office [phone] cell  Denies iv issues Denies claustro Has PPM  Regular meds , holding HCTZ

## 2021-01-15 ENCOUNTER — Ambulatory Visit (HOSPITAL_COMMUNITY)
Admission: RE | Admit: 2021-01-15 | Discharge: 2021-01-15 | Disposition: A | Discharge status: Home / Self Care | Payer: Medicare Other | Source: Ambulatory Visit | Attending: Cardiology | Admitting: Cardiology

## 2021-01-15 ENCOUNTER — Encounter (HOSPITAL_COMMUNITY): Payer: Self-pay

## 2021-01-15 ENCOUNTER — Other Ambulatory Visit: Payer: Self-pay

## 2021-01-15 DIAGNOSIS — I5042 Chronic combined systolic (congestive) and diastolic (congestive) heart failure: Secondary | ICD-10-CM | POA: Diagnosis not present

## 2021-01-15 DIAGNOSIS — R079 Chest pain, unspecified: Secondary | ICD-10-CM | POA: Diagnosis not present

## 2021-01-15 MED ORDER — IOHEXOL 350 MG/ML SOLN
95.0000 mL | Freq: Once | INTRAVENOUS | Status: AC | PRN
  Administered 2021-01-15: 95 mL via INTRAVENOUS

## 2021-01-15 MED ORDER — NITROGLYCERIN 0.4 MG SL SUBL
0.8000 mg | SUBLINGUAL_TABLET | Freq: Once | SUBLINGUAL | Status: AC
  Administered 2021-01-15: 0.8 mg via SUBLINGUAL

## 2021-01-15 MED ORDER — NITROGLYCERIN 0.4 MG SL SUBL
SUBLINGUAL_TABLET | SUBLINGUAL | Status: AC
  Filled 2021-01-15: qty 2

## 2021-01-16 NOTE — Progress Notes (Signed)
Synopsis: Referred for emphysema by Hali Marry, *  Subjective:   PATIENT ID: Donald Patel GENDER: male DOB: 11-06-1940, MRN: [id number]  Chief Complaint  Patient presents with   Consult    Consult for Emphysema. Patient reports after getting the Wynetta Emery and [place] vaccine he developed SOB, chest pain, and back pain.       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   He says he quit smoking in 1989 and since then he knows he's always had emphysema and he would occasionally feel chest pressure anteriorly and posteriorly - it is not necessarily exertional, feels it at rest sometimes, and it isn't clearly worsened by exertion. It has accelerated per his report after he got [employer] vaccine 09/20/19). Had coronary CTA ordered by cardiology which did not reveal evidence of large vessel. Very little in way of DOE - does not limit his activities, has never tried inhaler. No exacerbations of COPD.      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 He felt like he always had good access to masks, vacuuming to minimize exposure to metals, solvents, etc. From [place] originally, moved to [facility] for [employer]. Lives near [place] currently.    No family history of lung cancer, lung disease  Otherwise pertinent review of systems is negative.  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   ERECTILE DYSFUNCTION, ORGANIC    History of colon polyps    Hypertension    Hypertension      Family History  Problem Relation Age of Onset   Breast cancer Mother    Heart attack Brother    Diabetes Other    Hodgkin's lymphoma Brother    Colon cancer Neg Hx    Esophageal cancer Neg Hx    Rectal  cancer Neg Hx    Stomach cancer Neg Hx      Past Surgical History:  Procedure Laterality Date   CATARACT EXTRACTION W/ INTRAOCULAR LENS IMPLANT  2013   right eye - [facility]   CATARACT EXTRACTION W/ INTRAOCULAR LENS IMPLANT  2015   Left eye - [facility]   COLONOSCOPY  11/29/2015   [facility]   KNEE SURGERY     PACEMAKER PLACEMENT  12/22/96   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, most recent generator (SJM) 05/02/04 both implanted by Dr Rebecka Apley [facility]   PERMANENT PACEMAKER GENERATOR CHANGE N/A 07/29/2013   Procedure: PERMANENT PACEMAKER GENERATOR CHANGE;  Surgeon: Coralyn Mark, MD;  Location: [place] CATH LAB;  Service: Cardiovascular;  Laterality: N/A;   VASECTOMY      Social History   Socioeconomic History   Marital status: Married    Spouse name: Not on file   Number of children: Not on file   Years of education: Not on file   Highest education level: Not on file  Occupational History   Occupation: Retired  Tobacco Use   Smoking status: Former    Packs/day: 1.50    Years: 35.00    Pack years: 52.50    Types: Cigarettes, Pipe, Cigars    Quit date: 06/10/1985    Years since quitting: 35.6   Smokeless tobacco: Never  Substance and Sexual Activity   Alcohol use:  Yes    Alcohol/week: 14.0 standard drinks    Types: 14 Cans of beer per week   Drug use: No   Sexual activity: Never  Other Topics Concern   Not on file  Social History Narrative   Recently retired,Former Furniture conservator/restorer.  Exercise.    Social Determinants of Health   Financial Resource Strain: Not on file  Food Insecurity: Not on file  Transportation Needs: Not on file  Physical Activity: Not on file  Stress: Not on file  Social Connections: Not on file  Intimate Partner Violence: Not on file     Allergies  Allergen Reactions   Lisinopril Other (See Comments)     Outpatient Medications Prior to Visit  Medication Sig Dispense Refill   carvedilol (COREG) 6.25 MG tablet Take 1 tablet (6.25 mg  total) by mouth 2 (two) times daily. 180 tablet 3   Cholecalciferol (VITAMIN D3) 25 MCG/SPRAY LIQD Take by mouth.     hydrochlorothiazide (HYDRODIURIL) 25 MG tablet Take 1 tablet (25 mg total) by mouth daily. 90 tablet 3   ibuprofen (ADVIL) 200 MG tablet Take 200 mg by mouth as needed.     losartan (COZAAR) 25 MG tablet TAKE 1 TABLET(25 MG) BY MOUTH DAILY 90 tablet 3   Multiple Vitamins-Minerals (PRESERVISION AREDS 2 PO) Take by mouth as needed.     Psyllium (METAMUCIL) 28.3 % POWD Take by mouth daily as needed.     No facility-administered medications prior to visit.       Objective:   Physical Exam:  General appearance: 80 y.o., male, NAD, conversant  Eyes: anicteric sclerae, moist conjunctivae; no lid-lag; PERRL, tracking appropriately HENT: NCAT; oropharynx, MMM, no mucosal ulcerations; normal hard and soft palate Neck: Trachea midline; no lymphadenopathy, no JVD Lungs: CTAB, no crackles, no wheeze, with normal respiratory effort CV: RRR, no MRGs  Abdomen: Soft, non-tender; non-distended, BS present  Extremities: No peripheral edema, radial and DP pulses present bilaterally  Skin: Normal temperature, turgor and texture; no rash Psych: Appropriate affect Neuro: Alert and oriented to person and place, no focal deficit    Vitals:   01/17/21 1132  BP: 128/80  Pulse: 60  SpO2: 100%  Weight: 149 lb (67.6 kg)  Height: '5\' 10"'$  (1.778 m)   100% on RA BMI Readings from Last 3 Encounters:  01/17/21 21.38 kg/m  01/04/21 21.21 kg/m  11/09/20 20.95 kg/m   Wt Readings from Last 3 Encounters:  01/17/21 149 lb (67.6 kg)  01/04/21 147 lb 12.8 oz (67 kg)  11/09/20 146 lb (66.2 kg)     CBC    Component Value Date/Time   WBC 6.2 11/09/2020 0000   RBC 5.08 11/09/2020 0000   HGB 16.4 11/09/2020 0000   HCT 47.6 11/09/2020 0000   PLT 198 11/09/2020 0000   MCV 93.7 11/09/2020 0000   MCH 32.3 11/09/2020 0000   MCHC 34.5 11/09/2020 0000   RDW 12.4 11/09/2020 0000   LYMPHSABS  1,128 11/09/2020 0000   MONOABS 1.2 (H) 07/28/2013 1202   EOSABS 211 11/09/2020 0000   BASOSABS 62 11/09/2020 0000    Low level eosinophilia currently  Chest Imaging: CT Chest 11/10/20 reviewed by me and remarkable for biapical scarring and multiple small nodules which appear stable in comparison with CT Chest from 2019, upper lobe predominant emphysema, apical bullae  Pulmonary Functions Testing Results: No flowsheet data found.   Echocardiogram: TTE 6/29 with dilatoed aortic root 46m, EF 40-45%  Heart Catheterization: None    Assessment & Plan:   #  Emphysema: if his chest pressure sensation is just the way he's describing dyspnea on exertion and it is referable to COPD then he would be a Gold functional group B, not an exacerbator. His chest pressure sensation to me raises concern alternatively for GERD (also mentions ongoing trouble with dysphagia), or microvascular coronary disease.    # Multiple pulmonary nodules: =< 10m, stable from 2019-2022, likely benign  Plan: - trial anoro 1 puff once dail, samples given in clinic - SABA prn - PFTs, instructed to hold anoro for 1-2 days before PFTs     NMaryjane Hurter MD [facility] 01/17/2021 12:00 PM

## 2021-01-17 ENCOUNTER — Ambulatory Visit: Payer: Medicare Other | Admitting: Student

## 2021-01-17 ENCOUNTER — Other Ambulatory Visit: Payer: Self-pay

## 2021-01-17 ENCOUNTER — Encounter: Payer: Self-pay | Admitting: Student

## 2021-01-17 VITALS — BP 128/80 | HR 60 | Ht 70.0 in | Wt 149.0 lb

## 2021-01-17 DIAGNOSIS — R918 Other nonspecific abnormal finding of lung field: Secondary | ICD-10-CM | POA: Diagnosis not present

## 2021-01-17 DIAGNOSIS — J432 Centrilobular emphysema: Secondary | ICD-10-CM

## 2021-01-17 MED ORDER — ANORO ELLIPTA 62.5-25 MCG/INH IN AEPB: 1.0000 | INHALATION_SPRAY | Freq: Every day | RESPIRATORY_TRACT | 11 refills | Status: DC

## 2021-01-17 NOTE — Patient Instructions (Signed)
-   You will schedule breathing tests today (PFTs), stop the inhaler below for 1-2 days before your breathing tests - You can pick up anoro (new inhaler) at the pharmacy today, start taking 1 puff once daily - See you in 6 months or sooner if need be, call or send a message

## 2021-01-23 ENCOUNTER — Ambulatory Visit: Payer: Medicare Other

## 2021-01-23 ENCOUNTER — Telehealth: Payer: Self-pay | Admitting: Student

## 2021-01-23 DIAGNOSIS — J432 Centrilobular emphysema: Secondary | ICD-10-CM

## 2021-01-23 NOTE — Telephone Encounter (Signed)
Called and spoke with Donald Patel letting him know the info stated by [facility] and he verbalized understanding.  Donald Patel is aware that this will be addressed by Dr. Verlee Monte tomorrow when he returns to clinic.  What Donald Patel wants to know is, can he go ahead and have the PFT performed now instead of waiting until February 2023 so that way we can determine then what different inhaler therapy would be the best one to put him on.  Routing to Dr. Verlee Monte.

## 2021-01-23 NOTE — Telephone Encounter (Signed)
Called and spoke with pt about reactions he has been having from the anoro. Pt said that he started experiencing side effects about 2 days after he started taking the anoro.  Pt said that he was experiencing a headache, dizziness, blurry vision, and also was having chest discomfort from using it.  Pt said that he stopped taking the anoro yesterday 8/15 due to the side effects he had been having.  Asked pt how he was feeling today as he did not take it and he said that he was feeling better.  Due to this, pt wants to know if there is an alternate therapy that pt might be able to try.  With Dr. Verlee Monte being out of the office today, routing to TP.  Tammy, please advise.

## 2021-01-23 NOTE — Telephone Encounter (Signed)
Agree with stopping ANORO for time being. Dr. Verlee Monte is here tomorrow and can address this.

## 2021-01-23 NOTE — Telephone Encounter (Signed)
Pt stated that the Anoro inhaler has not been working for him and he stopped taking it yesterday because it caused him to have a headache, dizziness, blurry eyes, and he felt more chest pain using it.  Pharmacy; [facility] [place], [place] - [address] AT [facility] [address]  [place], [place] [address]   Pls regard; [phone]

## 2021-01-24 NOTE — Telephone Encounter (Signed)
Called and discussed how he felt with anoro. Tried it for a couple days had some blurry vision, dizziness. Chest discomfort a bit worse with it. Has been off it for a few days now. Still wondering if GERD or microvascular CAD playing role. We'll wait on GI evaluation, PFTs before trialing alternative inhaler. All questions addressed.   [place]

## 2021-01-26 NOTE — Telephone Encounter (Signed)
Spoke to patient he stated he cannot take Carvedilol 6.25 mg,causing him to feel bad,blurred vision,dizzy,light headed.Stated his B/P has been normal.Advised I will send message to [place] for advice.

## 2021-01-29 MED ORDER — METOPROLOL SUCCINATE ER 25 MG PO TB24: 25.0000 mg | ORAL_TABLET | Freq: Every day | ORAL | 3 refills | Status: DC

## 2021-01-29 NOTE — Telephone Encounter (Signed)
Would recommend beta-blocker given his heart failure.  Recommend switching from carvedilol to Toprol-XL 25 mg daily.

## 2021-02-02 ENCOUNTER — Other Ambulatory Visit: Payer: Self-pay

## 2021-02-02 ENCOUNTER — Ambulatory Visit: Payer: Medicare Other | Admitting: Student

## 2021-02-02 DIAGNOSIS — J432 Centrilobular emphysema: Secondary | ICD-10-CM

## 2021-02-02 NOTE — Progress Notes (Signed)
Full PFT performed today. °

## 2021-02-02 NOTE — Patient Instructions (Signed)
Full PFT performed. 

## 2021-02-05 ENCOUNTER — Other Ambulatory Visit: Payer: Self-pay

## 2021-02-05 ENCOUNTER — Ambulatory Visit (INDEPENDENT_AMBULATORY_CARE_PROVIDER_SITE_OTHER): Payer: Medicare Other | Admitting: Pharmacist

## 2021-02-05 VITALS — BP 148/62 | HR 71

## 2021-02-05 DIAGNOSIS — I10 Essential (primary) hypertension: Secondary | ICD-10-CM | POA: Diagnosis not present

## 2021-02-05 NOTE — Patient Instructions (Addendum)
Start taking metoprolol tartrate '25mg'$  daily  I will call you in 2 weeks to see how you are doing  Follow up 9/28 with Dr. Gardiner Rhyme

## 2021-02-05 NOTE — Progress Notes (Signed)
Patient ID: Donald Patel                 DOB: [birth year]                      MRN: [id number]     HPI: ONDRE SOHM is a 80 y.o. male referred by Dr. Gardiner Rhyme to pharmacy clinic for HF medication management. PMH is significant for complete heart block status post PPM, hypertension, COPD, paroxysmal atrial fibrillation, recently diagnosed heart failure who presents for follow-up. 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   Today he presents to pharmacy clinic for further medication titration. At last visit with MD carvedilol 6.'25mg'$  BID was started. However he sent a mychart message stating that it was causing him to have blurred vision, dizziness and lightheadedness. Carvedilol was stopped and changed to metoprolol succinate '25mg'$  daily. However, patient reports he has not started yet. He wanted to ask about the warning about his emphysema. Symptomatically, he denies dizziness, lightheadedness, and fatigue. Feels SOB when he exerts himself. Able to complete all ADLs. Activity level is good. He does not checks her weight at home. No LEE, PND, or orthopnea.   He states that he as white coat syndrome and BP is good at home. Home readings ranged from 150-120's mostly. He states his PCP verified the accuracy of his BP cuff. He does not like to take medications. Contributes his chest pain to his johnson and johnson vaccine. Takes homeopathic medication during flu season.   Current CHF meds: losartan '25mg'$  daily, metoprolol succinate '25mg'$  daily Other BP medications: HCTZ '25mg'$  daily Previously tried: carvedilol 6.'25mg'$  BID (blurred vision, dizziness and lightheadedness) BP goal: <130/80  Family History: The patient's family history includes Breast cancer in his mother; Diabetes in an other family member; Heart attack in his brother; Hodgkin's lymphoma in his brother. There is no history of Colon cancer,  Esophageal cancer, Rectal cancer, or Stomach cancer.  Social History: former smoker, shot of vodka ever days  Diet: not discussed  Exercise: limited due to knee pain  Home BP readings: 135/70, 122/65, 125/68, 140/75, 145/79, 136/74, 112/62, 125/71, 125/63, 138/74, 107/57, 136/68, 143/68 131/78, 122/67, Average 133/70  Wt Readings from Last 3 Encounters:  01/17/21 149 lb (67.6 kg)  01/04/21 147 lb 12.8 oz (67 kg)  11/09/20 146 lb (66.2 kg)   BP Readings from Last 3 Encounters:  01/17/21 128/80  01/15/21 (!) 149/88  01/04/21 (!) 158/80   Pulse Readings from Last 3 Encounters:  01/17/21 60  01/04/21 60  11/09/20 79    Renal function: CrCl cannot be calculated (Patient's most recent lab result is older than the maximum 21 days allowed.).  Past Medical History:  Diagnosis Date   AV BLOCK, COMPLETE    s/p PPM (SJM)   Cataract both eyes corrected   Chronic right-sided low back pain with right-sided sciatica 11/09/2019   Emphysema lung ([place]) 11/09/2020   ERECTILE DYSFUNCTION, ORGANIC    History of colon polyps    Hypertension    Hypertension     Current Outpatient Medications on File Prior to Visit  Medication Sig Dispense Refill   Cholecalciferol (VITAMIN D3) 25 MCG/SPRAY LIQD Take by mouth.     hydrochlorothiazide (HYDRODIURIL) 25 MG tablet Take 1 tablet (25 mg total) by mouth daily. 90 tablet 3   ibuprofen (ADVIL) 200 MG tablet Take 200 mg by mouth as needed.  losartan (COZAAR) 25 MG tablet TAKE 1 TABLET(25 MG) BY MOUTH DAILY 90 tablet 3   metoprolol succinate (TOPROL XL) 25 MG 24 hr tablet Take 1 tablet (25 mg total) by mouth daily. 30 tablet 3   Multiple Vitamins-Minerals (PRESERVISION AREDS 2 PO) Take by mouth as needed.     Psyllium (METAMUCIL) 28.3 % POWD Take by mouth daily as needed.     umeclidinium-vilanterol (ANORO ELLIPTA) 62.5-25 MCG/INH AEPB Inhale 1 puff into the lungs daily. 1 each 11   No current facility-administered medications on file prior to visit.     Allergies  Allergen Reactions   Lisinopril Other (See Comments)     Assessment/Plan:  1. CHF - Blood pressure above goal of <130/80 in clinic. Better controlled at home, but still higher than I would like. Patient is agreeable to starting metoprolol succinate '25mg'$  daily once I explained the selectivity of this medication at low doses. I will call patient in 2 weeks to see how he is doing. At that time I would discuss with patient increasing losartan to '50mg'$  if patient agreeable. He has f/u with Dr. Gardiner Rhyme on 9/28. We did review the reasoning behind the addition of a beta blocker and the results of his echo.  Thank you,  Ramond Dial, Pharm.D, BCPS, CPP [place]  [id number] [place], [place] [address]  Phone: [phone]; Fax: [phone]

## 2021-02-07 ENCOUNTER — Telehealth: Payer: Self-pay | Admitting: Student

## 2021-02-07 NOTE — Telephone Encounter (Signed)
Called and spoke with patient. He was calling to request the results of his PFT that he completed on 02/02/21. I looked at his chart and the PFT has not been loaded to his chart. I advised him that once we hear back from Dr. Verlee Monte, we will call him back. He verbalized understanding.   Will ask for PFT to be loaded to his chart tomorrow since it is after 5pm and the PFT staff has left for the day.

## 2021-02-08 NOTE — Telephone Encounter (Signed)
Please advise on patient PFT results.

## 2021-02-09 LAB — PULMONARY FUNCTION TEST
DL/VA % pred: 78 %
DL/VA: 3.61 ml/min/mmHg/L
DLCO cor % pred: 48 %
DLCO cor: 15.81 ml/min/mmHg
DLCO unc % pred: 48 %
DLCO unc: 15.81 ml/min/mmHg
FEF 25-75 Post: 0.91 L/sec
FEF 25-75 Pre: 0.83 L/sec
FEF2575-%Change-Post: 9 %
FEF2575-%Pred-Post: 48 %
FEF2575-%Pred-Pre: 44 %
FEV1-%Change-Post: 5 %
FEV1-%Pred-Post: 64 %
FEV1-%Pred-Pre: 61 %
FEV1-Post: 1.71 L
FEV1-Pre: 1.63 L
FEV1FVC-%Change-Post: 2 %
FEV1FVC-%Pred-Pre: 76 %
FEV6-%Change-Post: 3 %
FEV6-Post: 2.89 L
FEV6-Pre: 2.81 L
FVC-%Change-Post: 2 %
FVC-%Pred-Post: 81 %
FVC-%Pred-Pre: 79 %
FVC-Post: 2.89 L
FVC-Pre: 2.83 L
Post FEV1/FVC ratio: 59 %
Post FEV6/FVC ratio: 100 %
Pre FEV1/FVC ratio: 57 %
Pre FEV6/FVC Ratio: 100 %
RV % pred: 101 %
RV: 2.72 L
TLC % pred: 78 %
TLC: 5.53 L

## 2021-02-09 NOTE — Telephone Encounter (Signed)
Noted! Thank you

## 2021-02-09 NOTE — Telephone Encounter (Signed)
Per Dr Verlee Monte:   His PFT didn't include lower limit of normals for me to be able to read it. Do you know how we can add that to his results in [place]?   Thanks!    Davy Pique, can you please help with this? Thanks!

## 2021-02-09 NOTE — Telephone Encounter (Signed)
Called and discussed his PFTs which showed fixed moderately severe airway obstruction, reduced diffusing capacity. He'll try anoro again now that he's on an alternative BP medication and let us know if he has the same dizziness issue, blurry vision.

## 2021-02-13 ENCOUNTER — Other Ambulatory Visit: Payer: Self-pay

## 2021-02-13 ENCOUNTER — Encounter: Payer: Self-pay | Admitting: Gastroenterology

## 2021-02-13 ENCOUNTER — Ambulatory Visit (INDEPENDENT_AMBULATORY_CARE_PROVIDER_SITE_OTHER): Payer: Medicare Other | Admitting: Gastroenterology

## 2021-02-13 VITALS — BP 146/68 | HR 60 | Ht 70.0 in | Wt 149.4 lb

## 2021-02-13 DIAGNOSIS — K641 Second degree hemorrhoids: Secondary | ICD-10-CM | POA: Diagnosis not present

## 2021-02-13 DIAGNOSIS — R1310 Dysphagia, unspecified: Secondary | ICD-10-CM

## 2021-02-13 DIAGNOSIS — R159 Full incontinence of feces: Secondary | ICD-10-CM

## 2021-02-13 DIAGNOSIS — Z86010 Personal history of colonic polyps: Secondary | ICD-10-CM | POA: Diagnosis not present

## 2021-02-13 NOTE — Progress Notes (Signed)
Chief Complaint: Dysphagia  GI history: Donald Patel is a 80 y.o. male with a history of HTN, history of AV block s/p PPM.  Initially seen in the GI clinic on 09/08/2020 for evaluation of fecal seepage which started after hemorrhoid banding at outside facility in 2013.  No nocturnal leakage.  No urge incontinence. - Colonoscopy (09/2020): Tattoo with adjacent scar and subtle nodularity in proximal ascending colon (biopsied: Benign), 3 subcentimeter tubular adenomas, sigmoid diverticulosis, internal hemorrhoids with scars from prior banding.  Recommend repeat in 3 years for continued surveillance   Endoscopic History: -Colonoscopy (03/2012, [facility]): Diminutive cecal polyp (SSP), 1.5 cm ascending colon polyp resected piecemeal and tattooed (TA), diminutive transverse polyp (TA), medium-sized internal/external hemorrhoids, sigmoid diverticulosis.  Recommend repeat in 6 months -Hemorrhoid banding at 3 separate sessions in 05/2012 -Colonoscopy (07/2012, Dr. Cindee Salt  at [facility]): 7-8 mm polyp at hepatic flexure, cecal polyp, prior polypectomy site in ascending colon biopsy, Scar from prior hemorrhoid banding and distal rectal, sigmoid diverticulosis.  No path review.  Repeat in 3 years -Colonoscopy (11/2015, Dr. Cindee Salt at [facility]): 5 mm tubular adenoma in ascending colon, no residual polyp at prior descending colon polypectomy site, pandiverticulosis, external hemorrhoids.  Scar from prior hemorrhoid banding in distal rectum.  Recommended repeat in 5 years - Colonoscopy (09/2020): Tattoo with adjacent scar and subtle nodularity in proximal ascending colon (biopsied: Benign), 3 subcentimeter tubular adenomas, sigmoid diverticulosis, internal hemorrhoids with scars from prior banding.  Recommend repeat in 3-5 years for continued surveillance  HPI:     Donald Patel is a 80 y.o. male referred to the Gastroenterology Clinic for  evaluation of dysphagia with pills.  No issue tolerating solids or liquids otherwise.  Does have dentures. "Only occurs when pill is >200 mg". Points to suprasternal notch and will occasional regurgitate pill back out.  Otherwise, weight is stable.  No fever, chills, nausea, vomiting.  Does not think he has had a prior EGD.  Not endorsing any other complaints today.  Past Medical History:  Diagnosis Date   AV BLOCK, COMPLETE    s/p PPM (SJM)   Cataract both eyes corrected   Chronic right-sided low back pain with right-sided sciatica 11/09/2019   Emphysema lung ([place]) 11/09/2020   ERECTILE DYSFUNCTION, ORGANIC    History of colon polyps    Hypertension    Hypertension      Past Surgical History:  Procedure Laterality Date   CATARACT EXTRACTION W/ INTRAOCULAR LENS IMPLANT  2013   right eye - [facility]   CATARACT EXTRACTION W/ INTRAOCULAR LENS IMPLANT  2015   Left eye - [facility]   COLONOSCOPY  11/29/2015   [facility]   KNEE SURGERY     PACEMAKER PLACEMENT  12/22/96   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, most recent generator (SJM) 05/02/04 both implanted by Dr Rebecka Apley [facility]   PERMANENT PACEMAKER GENERATOR CHANGE N/A 07/29/2013   Procedure: PERMANENT PACEMAKER GENERATOR CHANGE;  Surgeon: Coralyn Mark, MD;  Location: [place] CATH LAB;  Service: Cardiovascular;  Laterality: N/A;   VASECTOMY     Family History  Problem Relation Age of Onset   Breast cancer Mother    Heart attack Brother    Diabetes Other    Hodgkin's lymphoma Brother    Colon cancer Neg Hx    Esophageal cancer Neg Hx    Rectal cancer Neg Hx  Stomach cancer Neg Hx    Social History   Tobacco Use   Smoking status: Former    Packs/day: 1.50    Years: 35.00    Pack years: 52.50    Types: Cigarettes, Pipe, Cigars    Quit date: 06/10/1985    Years since quitting: 35.7   Smokeless tobacco: Never  Substance Use Topics   Alcohol use: Yes    Alcohol/week: 14.0 standard drinks    Types: 14 Cans of beer  per week   Drug use: No   Current Outpatient Medications  Medication Sig Dispense Refill   Cholecalciferol (VITAMIN D3) 25 MCG/SPRAY LIQD Take by mouth.     hydrochlorothiazide (HYDRODIURIL) 25 MG tablet Take 1 tablet (25 mg total) by mouth daily. 90 tablet 3   ibuprofen (ADVIL) 200 MG tablet Take 200 mg by mouth as needed.     losartan (COZAAR) 25 MG tablet TAKE 1 TABLET(25 MG) BY MOUTH DAILY 90 tablet 3   metoprolol succinate (TOPROL XL) 25 MG 24 hr tablet Take 1 tablet (25 mg total) by mouth daily. 30 tablet 3   Multiple Vitamins-Minerals (PRESERVISION AREDS 2 PO) Take by mouth as needed.     Psyllium (METAMUCIL) 28.3 % POWD Take by mouth daily as needed.     umeclidinium-vilanterol (ANORO ELLIPTA) 62.5-25 MCG/INH AEPB Inhale 1 puff into the lungs daily. (Patient not taking: Reported on 02/13/2021) 1 each 11   No current facility-administered medications for this visit.   Allergies  Allergen Reactions   Lisinopril Other (See Comments)     Review of Systems: All systems reviewed and negative except where noted in HPI.     Physical Exam:    Wt Readings from Last 3 Encounters:  02/13/21 149 lb 6 oz (67.8 kg)  01/17/21 149 lb (67.6 kg)  01/04/21 147 lb 12.8 oz (67 kg)    BP (!) 146/68   Pulse 60   Ht '5\' 10"'$  (1.778 m)   Wt 149 lb 6 oz (67.8 kg)   SpO2 98%   BMI 21.43 kg/m  Constitutional:  Pleasant, in no acute distress. Neck supple. No cervical LAD. Abdominal: Soft, nondistended, nontender. Bowel sounds active throughout. There are no masses palpable. No hepatomegaly. Neurological: Alert and oriented to person place and time. Skin: Skin is warm and dry. No rashes noted.   ASSESSMENT AND PLAN;   1) Dysphagia - Discussed DDx for dysphagia.  Discussed diagnostic options to include 1) esophagram, 2) EGD.  He prefers evaluation with esophagram - Barium esophagram with barium tablet - Depending on esophagram results, can proceed with EGD as appropriate - In the meantime,  continue cutting food into small pieces, chewing thoroughly, and drinking plenty of fluids with meals  2) History of colon polyps - Colonoscopy earlier this year with 3 subcentimeter tubular adenomas - Per current guidelines, could consider repeat colonoscopy in 3-5 years.  Based on age and comorbidities (emphysema), can revisit this in 3-5 years with continued surveillance as appropriate  3) Internal hemorrhoids - Conservative management  4) Fecal seepage - If symptoms recur, could consider anorectal manometry - Continue fiber supplement   Lavena Bullion, DO, FACG  02/13/2021, 2:23 PM   Hali Marry, *

## 2021-02-13 NOTE — Patient Instructions (Signed)
If you are age 81 or older, your body mass index should be between 23-30. Your Body mass index is 21.43 kg/m. If this is out of the aforementioned range listed, please consider follow up with your Primary Care Provider.  If you are age 59 or younger, your body mass index should be between 19-25. Your Body mass index is 21.43 kg/m. If this is out of the aformentioned range listed, please consider follow up with your Primary Care Provider.   __________________________________________________________  The [facility] providers would like to encourage you to use [facility] to communicate with providers for non-urgent requests or questions.  Due to long hold times on the telephone, sending your provider a message by [facility] may be a faster and more efficient way to get a response.  Please allow 48 business hours for a response.  Please remember that this is for non-urgent requests.   You have been scheduled for a Barium Esophogram at [place] on  02-21-2021  at 1030am . Please arrive 15 minutes prior to your appointment for registration. Make certain not to have anything to eat or drink 3 hours prior to your test. If you need to reschedule for any reason, please contact radiology at [phone] to do so. __________________________________________________________________ A barium swallow is an examination that concentrates on views of the esophagus. This tends to be a double contrast exam (barium and two liquids which, when combined, create a gas to distend the wall of the oesophagus) or single contrast (non-ionic iodine based). The study is usually tailored to your symptoms so a good history is essential. Attention is paid during the study to the form, structure and configuration of the esophagus, looking for functional disorders (such as aspiration, dysphagia, achalasia, motility and reflux) EXAMINATION You may be asked to change into a gown, depending on the type of swallow being performed. A  radiologist and radiographer will perform the procedure. The radiologist will advise you of the type of contrast selected for your procedure and direct you during the exam. You will be asked to stand, sit or lie in several different positions and to hold a small amount of fluid in your mouth before being asked to swallow while the imaging is performed .In some instances you may be asked to swallow barium coated marshmallows to assess the motility of a solid food bolus. The exam can be recorded as a digital or video fluoroscopy procedure. POST PROCEDURE It will take 1-2 days for the barium to pass through your system. To facilitate this, it is important, unless otherwise directed, to increase your fluids for the next 24-48hrs and to resume your normal diet.  This test typically takes about 30 minutes to perform. __________________________________________________________________________________  Please call with any questions or concerns.  It was a pleasure to see you today!  Vito Cirigliano, D.O.

## 2021-02-19 ENCOUNTER — Telehealth: Payer: Self-pay | Admitting: Pharmacist

## 2021-02-19 NOTE — Telephone Encounter (Signed)
Called patient to see how he was doing on metoprolol. He states terrible. He stopped taking yesterday. States it was making his ache, dizzy, "cant function." Patient upset and states he doesn't understand why he needs to be on all this medicine and wants to talk to Dr. Gardiner Rhyme about this at this apt at the end of the month. States he is doing fine on losartan an HCTZ. BP systolic 0000000, [id number] (reports mostly <130) Patient has blood pressure room to increase losartan, however patient is resistant to any change. I will defer any changes at this time. Follow up with Dr. Gardiner Rhyme 9/28. I asked him to bring her list of BP to that visit.

## 2021-02-21 ENCOUNTER — Ambulatory Visit (HOSPITAL_COMMUNITY): Payer: Medicare Other

## 2021-02-22 ENCOUNTER — Ambulatory Visit (HOSPITAL_COMMUNITY)
Admission: RE | Admit: 2021-02-22 | Discharge: 2021-02-22 | Disposition: A | Discharge status: Home / Self Care | Payer: Medicare Other | Source: Ambulatory Visit | Attending: Gastroenterology | Admitting: Gastroenterology

## 2021-02-22 ENCOUNTER — Other Ambulatory Visit: Payer: Self-pay

## 2021-02-22 DIAGNOSIS — R1310 Dysphagia, unspecified: Secondary | ICD-10-CM | POA: Diagnosis not present

## 2021-02-22 DIAGNOSIS — T17320A Food in larynx causing asphyxiation, initial encounter: Secondary | ICD-10-CM | POA: Diagnosis not present

## 2021-02-27 ENCOUNTER — Telehealth: Payer: Self-pay | Admitting: General Surgery

## 2021-02-27 NOTE — Telephone Encounter (Signed)
Notified the patient of his results to the Barium esophagram. The patient has decided to do nothing further. He states he will call if anything else changes but he states he is swallowing better.

## 2021-02-27 NOTE — Telephone Encounter (Signed)
error 

## 2021-02-27 NOTE — Telephone Encounter (Signed)
-----   Message from Oto, DO sent at 02/26/2021 12:54 PM EDT ----- Barium esophagram demonstrates some piecemeal swallowing with laryngeal penetration to the cords and a prominent cricopharyngeus muscle.  Small type I hiatal hernia and a 1.9 cm nonobstructing Schatzki's ring.  Did have some apprehension about swallowing the barium tablet, but once he did, tablet passed without issue into the stomach.  Recommend the following:  - Referral to Speech Pathology for evaluation and possible MBS -Per previous discussion with the patient, he would like to hold off on upper endoscopy at this juncture.  After Speech Pathology evaluation, we can discuss the role/utility of EGD with dilation of both the cricopharyngeal bar and Schatzki's ring as appropriate in the GI clinic

## 2021-03-01 ENCOUNTER — Ambulatory Visit (INDEPENDENT_AMBULATORY_CARE_PROVIDER_SITE_OTHER): Payer: Medicare Other

## 2021-03-01 DIAGNOSIS — I442 Atrioventricular block, complete: Secondary | ICD-10-CM

## 2021-03-03 LAB — CUP PACEART REMOTE DEVICE CHECK
Battery Remaining Longevity: 25 mo
Battery Remaining Percentage: 22 %
Battery Voltage: 2.9 V
Brady Statistic AP VP Percent: 20 %
Brady Statistic AP VS Percent: 1 %
Brady Statistic AS VP Percent: 80 %
Brady Statistic AS VS Percent: 1 %
Brady Statistic RA Percent Paced: 20 %
Brady Statistic RV Percent Paced: 99 %
Date Time Interrogation Session: 20220922020015
Implantable Lead Implant Date: 20051123
Implantable Lead Implant Date: 20051123
Implantable Lead Location: 753859
Implantable Lead Location: 753860
Implantable Pulse Generator Implant Date: 20150219
Lead Channel Impedance Value: 440 Ohm
Lead Channel Impedance Value: 490 Ohm
Lead Channel Pacing Threshold Amplitude: 0.5 V
Lead Channel Pacing Threshold Amplitude: 1.25 V
Lead Channel Pacing Threshold Pulse Width: 0.5 ms
Lead Channel Pacing Threshold Pulse Width: 0.5 ms
Lead Channel Sensing Intrinsic Amplitude: 12 mV
Lead Channel Sensing Intrinsic Amplitude: 4.8 mV
Lead Channel Setting Pacing Amplitude: 1.5 V
Lead Channel Setting Pacing Amplitude: 2 V
Lead Channel Setting Pacing Pulse Width: 0.5 ms
Lead Channel Setting Sensing Sensitivity: 5 mV
Pulse Gen Model: 2240
Pulse Gen Serial Number: 7596156

## 2021-03-05 NOTE — Progress Notes (Signed)
Cardiology Office Note:    Date:  03/07/2021   ID:  [name], DOB [birth year], MRN [id number]  PCP:  Hali Marry, MD  Cardiologist:  [name], MD  Electrophysiologist:  [name], MD   Referring MD: Hali Marry, *   Chief Complaint  Patient presents with   Congestive Heart Failure     History of Present Illness:    Donald Patel is a 80 y.o. male with a hx of chronic combined systolic and diastolic heart failure, complete heart block status post PPM, hypertension, COPD, paroxysmal atrial fibrillation who presents for follow-up. 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  Device interrogation on 11/30/2020 showed 99% RV pacing. He was referred by Dr.Allred for an evaluation of heart failure, initially seen on 12/27/2020.  He says since his johnson&johnson vaccine (09/20/2019), he started experiencing persistent chest pains. The chest pains are located on both sides of chest and feels like pressure. He rates the pain 6/10.  Reports chest pain occurs all the time, has not noticed anything that makes it better or worse.  He also has shortness of breath when overexerting himself, and lower back and right knee pain.  Denies any syncope. Denies LE edema or palpitations.  Does not exercise due to knee pain.  He smoked a pack of cigarettes a day for 30 years but has stopped 33 years ago. His brother had MS and died of a heart attack at 81 yo.  Coronary CTA on 01/15/2021 showed minimal noncalcified plaque in D1, calcium score 0.  Also showed multiple small pulmonary nodules stable since 2019, considered definitively benign.  Since last clinic visit, he reports he is doing okay.  Reports chronic chest pain, no recent changes.  Denies any dyspnea, lower extremity edema, or palpitations.  Reports occasional lightheadedness, denies any syncope.  Did not tolerate carvedilol due to  lightheadedness.  Reports had issues with lightheadedness with metoprolol at first but is taking it now without any problems.  Brought home BP log, has been 120s to 140s over 60s and 70s.   Past Medical History:  Diagnosis Date   AV BLOCK, COMPLETE    s/p PPM ([facility])   Cataract both eyes corrected   Chronic right-sided low back pain with right-sided sciatica 11/09/2019   Emphysema lung ([place]) 11/09/2020   ERECTILE DYSFUNCTION, ORGANIC    History of colon polyps    Hypertension    Hypertension     Past Surgical History:  Procedure Laterality Date   CATARACT EXTRACTION W/ INTRAOCULAR LENS IMPLANT  2013   right eye - [facility]   CATARACT EXTRACTION W/ INTRAOCULAR LENS IMPLANT  2015   Left eye - [facility]   COLONOSCOPY  11/29/2015   [facility]   KNEE SURGERY     PACEMAKER PLACEMENT  12/22/96   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, most recent generator ([facility]) 05/02/04 both implanted by Dr Rebecka Aple[facility]   PERMANENT PACEMAKER GENERATOR CHANGE N/A 07/29/2013   Procedure: PERMANENT PACEMAKER GENERATOR CHANGE;  Surgeon: Coralyn Mark, MD;  Location: [facility] CATH LAB;  Service: Cardiovascular;  Laterality: N/A;   VASECTOMY      Current Medications: Current Meds  Medication Sig   Cholecalciferol (VITAMIN D3) 25 MCG/SPRAY LIQD Take by mouth.   hydrochlorothiazide (HYDRODIURIL) 25 MG tablet Take 1 tablet (25 mg total) by mouth daily.   ibuprofen (ADVIL) 200 MG tablet Take 200 mg by  mouth as needed.   losartan (COZAAR) 25 MG tablet TAKE 1 TABLET(25 MG) BY MOUTH DAILY   Multiple Vitamins-Minerals (PRESERVISION AREDS 2 PO) Take by mouth as needed.   Psyllium (METAMUCIL) 28.3 % POWD Take by mouth daily as needed.   [DISCONTINUED] umeclidinium-vilanterol (ANORO ELLIPTA) 62.5-25 MCG/INH AEPB Inhale 1 puff into the lungs daily.     Allergies:   Lisinopril   Social History   Socioeconomic History   Marital status: Married    Spouse name: Not on file   Number of children: Not on file    Years of education: Not on file   Highest education level: Not on file  Occupational History   Occupation: Retired  Tobacco Use   Smoking status: Former    Packs/day: 1.50    Years: 35.00    Pack years: 52.50    Types: Cigarettes, Pipe, Cigars    Quit date: 06/10/1985    Years since quitting: 35.7   Smokeless tobacco: Never  Substance and Sexual Activity   Alcohol use: Yes    Alcohol/week: 14.0 standard drinks    Types: 14 Cans of beer per week   Drug use: No   Sexual activity: Never  Other Topics Concern   Not on file  Social History Narrative   Recently retired,Former Furniture conservator/restorer.  Exercise.    Social Determinants of Health   Financial Resource Strain: Not on file  Food Insecurity: Not on file  Transportation Needs: Not on file  Physical Activity: Not on file  Stress: Not on file  Social Connections: Not on file     Family History: The patient's family history includes Breast cancer in his mother; Diabetes in an other family member; Heart attack in his brother; Hodgkin's lymphoma in his brother. There is no history of Colon cancer, Esophageal cancer, Rectal cancer, or Stomach cancer.  ROS:   Please see the history of present illness.    (+) chest pains, located both sides of chest  (+) lower back pain (+) right knee pain  (+) shortness of breath   All other systems reviewed and are negative.  EKGs/Labs/Other Studies Reviewed:    The following studies were reviewed today: ECHO 06/22:  IMPRESSIONS    1. Left ventricular ejection fraction, by estimation, is 40 to 45%. The  left ventricle has mildly decreased function. The left ventricle has no  regional wall motion abnormalities. Left ventricular diastolic parameters  are indeterminate.   2. Right ventricular systolic function is normal. The right ventricular  size is normal.   3. The mitral valve is normal in structure. No evidence of mitral valve  regurgitation. No evidence of mitral stenosis.   4. The aortic  valve is normal in structure. Aortic valve regurgitation is  trivial. No aortic stenosis is present.   5. Aortic dilatation noted. There is dilatation of the aortic root,  measuring 40 mm.   6. The inferior vena cava is normal in size with greater than 50%  respiratory variability, suggesting right atrial pressure of 3 mmHg.   Comparison(s): No prior Echocardiogram.   EKG:  07/22: AV pace rhythm, rate 60  Recent Labs: 05/11/2020: ALT 17 11/09/2020: Hemoglobin 16.4; Platelets 198 01/09/2021: BUN 13; Creatinine, Ser 1.03; Potassium 3.9; Sodium 139  Recent Lipid Panel    Component Value Date/Time   CHOL 230 (H) 01/09/2021 0906   TRIG 128 01/09/2021 0906   HDL 69 01/09/2021 0906   CHOLHDL 3.3 01/09/2021 0906   CHOLHDL 3.0 05/11/2020 0000   VLDL  17 11/13/2015 0830   LDLCALC 139 (H) 01/09/2021 0906   LDLCALC 123 (H) 05/11/2020 0000    Physical Exam:    VS:  BP (!) 184/88   Pulse 62   Ht 5\' 10"  (1.778 m)   Wt 150 lb 12.8 oz (68.4 kg)   SpO2 99%   BMI 21.64 kg/m     Wt Readings from Last 3 Encounters:  03/07/21 150 lb 12.8 oz (68.4 kg)  02/13/21 149 lb 6 oz (67.8 kg)  01/17/21 149 lb (67.6 kg)     GEN:  Well nourished, well developed in no acute distress HEENT: Normal NECK: No JVD; No carotid bruits CARDIAC: RRR, no murmurs, rubs, gallops RESPIRATORY:  Clear to auscultation without rales, wheezing or rhonchi  ABDOMEN: Soft, non-tender, non-distended MUSCULOSKELETAL:  No edema; No deformity  SKIN: Warm and dry NEUROLOGIC:  Alert and oriented x 3 PSYCHIATRIC:  Normal affect   ASSESSMENT:    1. Chronic combined systolic (congestive) and diastolic (congestive) heart failure ([place])   2. Essential hypertension, benign   3. Hyperlipidemia, unspecified hyperlipidemia type     PLAN:    Chronic combined systolic and diastolic heart failure: Echo 12/06/20 with EF 40 to 45%.  Coronary CTA on 01/15/2021 showed minimal noncalcified plaque in D1, calcium score 0.  -Appears  euvolemic -Unable to obtain MRI for NICM work-up due to PPM -Continue losartan 25 mg daily -Continue toprol XL 25 mg daily -Check BMP, magnesium -Discuss starting spironolactone or SGLT2 inhibitor but he would like to research these medications before starting.  Hypertension: On losartan 25 mg daily and hydrochlorothiazide 25 mg daily and Toprol-XL 25 mg daily.  BP elevated in clinic today, discussed adding spironolactone but he would like to hold off on resurge medication of restarting  Hyperlipidemia: LDL 139 on 01/09/2021.  Calcium score 0 on 01/15/21  RTC in 3 months  Medication Adjustments/Labs and Tests Ordered: Current medicines are reviewed at length with the patient today.  Concerns regarding medicines are outlined above.  Orders Placed This Encounter  Procedures   Basic metabolic panel   Magnesium    No orders of the defined types were placed in this encounter.   Patient Instructions  Medication Instructions:  Your physician recommends that you continue on your current medications as directed. Please refer to the Current Medication list given to you today.  **Recommend spironolactone and Farxiga**  *If you need a refill on your cardiac medications before your next appointment, please call your pharmacy*   Lab Work: BMET, [place] today  If you have labs (blood work) drawn today and your tests are completely normal, you will receive your results only by: [place] (if you have MyChart) OR A paper copy in the mail If you have any lab test that is abnormal or we need to change your treatment, we will call you to review the results.  Follow-Up: At [facility], you and your health needs are our priority.  As part of our continuing mission to provide you with exceptional heart care, we have created designated Provider Care Teams.  These Care Teams include your primary Cardiologist (physician) and Advanced Practice Providers (APPs -  Physician Assistants and Nurse  Practitioners) who all work together to provide you with the care you need, when you need it.  We recommend signing up for the patient portal called "MyChart".  Sign up information is provided on this After Visit Summary.  MyChart is used to connect with patients for Virtual Visits (Telemedicine).  Patients are able to view lab/test results, encounter notes, upcoming appointments, etc.  Non-urgent messages can be sent to your provider as well.   To learn more about what you can do with MyChart, go to [email or web address].    Your next appointment:   Tuesday, January 3rd at 3:40 pm with Dr. Gardiner Rhyme    Signed, Donato Heinz, MD  03/07/2021 12:29 PM    [place]

## 2021-03-07 ENCOUNTER — Other Ambulatory Visit: Payer: Self-pay

## 2021-03-07 ENCOUNTER — Encounter: Payer: Self-pay | Admitting: Cardiology

## 2021-03-07 ENCOUNTER — Ambulatory Visit: Payer: Medicare Other | Admitting: Cardiology

## 2021-03-07 VITALS — BP 184/88 | HR 62 | Ht 70.0 in | Wt 150.8 lb

## 2021-03-07 DIAGNOSIS — E785 Hyperlipidemia, unspecified: Secondary | ICD-10-CM

## 2021-03-07 DIAGNOSIS — I5042 Chronic combined systolic (congestive) and diastolic (congestive) heart failure: Secondary | ICD-10-CM | POA: Diagnosis not present

## 2021-03-07 DIAGNOSIS — I10 Essential (primary) hypertension: Secondary | ICD-10-CM | POA: Diagnosis not present

## 2021-03-07 NOTE — Patient Instructions (Signed)
Medication Instructions:  Your physician recommends that you continue on your current medications as directed. Please refer to the Current Medication list given to you today.  **Recommend spironolactone and Farxiga**  *If you need a refill on your cardiac medications before your next appointment, please call your pharmacy*   Lab Work: BMET, [place] today  If you have labs (blood work) drawn today and your tests are completely normal, you will receive your results only by: [place] (if you have MyChart) OR A paper copy in the mail If you have any lab test that is abnormal or we need to change your treatment, we will call you to review the results.  Follow-Up: At [facility], you and your health needs are our priority.  As part of our continuing mission to provide you with exceptional heart care, we have created designated Provider Care Teams.  These Care Teams include your primary Cardiologist (physician) and Advanced Practice Providers (APPs -  Physician Assistants and Nurse Practitioners) who all work together to provide you with the care you need, when you need it.  We recommend signing up for the patient portal called "MyChart".  Sign up information is provided on this After Visit Summary.  MyChart is used to connect with patients for Virtual Visits (Telemedicine).  Patients are able to view lab/test results, encounter notes, upcoming appointments, etc.  Non-urgent messages can be sent to your provider as well.   To learn more about what you can do with MyChart, go to [email or web address].    Your next appointment:   Tuesday, January 3rd at 3:40 pm with Dr. Gardiner Rhyme

## 2021-03-08 LAB — BASIC METABOLIC PANEL
BUN/Creatinine Ratio: 16 (ref 10–24)
BUN: 16 mg/dL (ref 8–27)
CO2: 25 mmol/L (ref 20–29)
Calcium: 9.5 mg/dL (ref 8.6–10.2)
Chloride: 97 mmol/L (ref 96–106)
Creatinine, Ser: 1 mg/dL (ref 0.76–1.27)
Glucose: 97 mg/dL (ref 70–99)
Potassium: 3.8 mmol/L (ref 3.5–5.2)
Sodium: 138 mmol/L (ref 134–144)
eGFR: 76 mL/min/{1.73_m2} (ref >59)

## 2021-03-08 LAB — MAGNESIUM: Magnesium: 1.9 mg/dL (ref 1.6–2.3)

## 2021-03-08 NOTE — Progress Notes (Signed)
Remote pacemaker transmission.   

## 2021-03-21 ENCOUNTER — Other Ambulatory Visit: Payer: Self-pay | Admitting: Student

## 2021-05-15 ENCOUNTER — Telehealth: Payer: Self-pay | Admitting: Family Medicine

## 2021-05-15 ENCOUNTER — Encounter: Payer: Self-pay | Admitting: Family Medicine

## 2021-05-15 ENCOUNTER — Other Ambulatory Visit: Payer: Self-pay

## 2021-05-15 ENCOUNTER — Ambulatory Visit (INDEPENDENT_AMBULATORY_CARE_PROVIDER_SITE_OTHER): Payer: Medicare Other | Admitting: Family Medicine

## 2021-05-15 VITALS — BP 134/72 | HR 77 | Temp 97.9°F | Resp 16 | Ht 70.0 in

## 2021-05-15 DIAGNOSIS — K222 Esophageal obstruction: Secondary | ICD-10-CM | POA: Insufficient documentation

## 2021-05-15 DIAGNOSIS — J439 Emphysema, unspecified: Secondary | ICD-10-CM

## 2021-05-15 DIAGNOSIS — R0789 Other chest pain: Secondary | ICD-10-CM | POA: Diagnosis not present

## 2021-05-15 DIAGNOSIS — H538 Other visual disturbances: Secondary | ICD-10-CM

## 2021-05-15 DIAGNOSIS — R519 Headache, unspecified: Secondary | ICD-10-CM | POA: Diagnosis not present

## 2021-05-15 DIAGNOSIS — I10 Essential (primary) hypertension: Secondary | ICD-10-CM

## 2021-05-15 LAB — BASIC METABOLIC PANEL WITH GFR
BUN: 15 mg/dL (ref 7–25)
CO2: 32 mmol/L (ref 20–32)
Calcium: 10 mg/dL (ref 8.6–10.3)
Chloride: 96 mmol/L — ABNORMAL LOW (ref 98–110)
Creat: 1.08 mg/dL (ref 0.70–1.22)
Glucose, Bld: 88 mg/dL (ref 65–99)
Potassium: 3.9 mmol/L (ref 3.5–5.3)
Sodium: 136 mmol/L (ref 135–146)
eGFR: 69 mL/min/{1.73_m2} (ref >=60)

## 2021-05-15 NOTE — Telephone Encounter (Signed)
Spoke with pt who stated that he cannot have MRIs because he has a pacemaker.  Advised pt that Dr. Madilyn Fireman may want to do a CT scan of the head.  Pt states that he will consider having imaging and call us back if he decides to wishes to proceed with this.  Charyl Bigger, CMA

## 2021-05-15 NOTE — Assessment & Plan Note (Signed)
He still having chest pressure and pain that he feels is limiting his ability to do things.  Unsure how much of this is long versus cardiac related he just feels frustrated that he has been seeing the specialist but is not really getting any improvement or relief that is actually improving his quality of life.  He says that he had a follow-up appointment in January with the cardiologist but had to reschedule and unfortunately did not have anything else for 3 more months out.  He would prefer to see someone more locally anyway he does not want to drive a long distance to be seen.  Place referral for [facility] location.

## 2021-05-15 NOTE — Assessment & Plan Note (Signed)
I did encourage him to retry the Anoro.  I do think it would really be worthwhile to see if it is helpful now that his other medications are stable if it does trigger more headaches then the pulmonologist was willing to send in a second medication that he could try which I think would be reasonable to see if it really does improve his lung symptoms but did remind him it may take a couple of weeks on the medication to notice a significant improvement.

## 2021-05-15 NOTE — Progress Notes (Signed)
Established Patient Office Visit  Subjective:  Patient ID: Donald Patel, male    DOB: 02-19-1941  Age: 80 y.o. MRN: [id number]  CC:  Chief Complaint  Patient presents with   Hypertension    Follow up     HPI [name] presents for 21-monthfollow-up.  Hypertension- Pt denies chest pain, SOB, dizziness, or heart palpitations.  Taking meds as directed w/o problems.  Denies medication side effects.  Brought in home blood pressure log today.  Patient states he takes 1/2 tablet daily of Losartan. Patient states Losartan dose was changed by Cardiologist Dr. SNechama Guard  He is following with cardiology for combined systolic and diastolic heart failure.  He saw pulmonology in August for his COPD.  They recommended a trial of Anoro 1 puff daily.  He says he tried the Anoro and felt like it was giving some some headaches.  But he did also started a new blood pressure medication around that time and so was not sure what was causing it so he just stopped the Anoro but says he does plan to retry it.  He still has some samples at home.  Encouraged him to schedule PFTs.  Also saw GI for his dysphagia and saw Dr. CBryan Lemmain September.  They did perform a barium esophagram which did show some piecemeal swallowing with laryngeal penetration to the cords.  Also noticed a hiatal hernia and a 1.9 cm Schatzki's ring that was not causing significant obstruction.  They recommended referral to speech pathology for evaluation and possible MBS.  He feels like his swallowing has actually gotten better he says he just drinks a little bit more water when he takes his pills and that seems to have corrected the issue for him so he is not interested in any further testing or evaluation at this point.  He is still very frustrated because he still continues to have chest pain and upper back pain and feels like it radiates around his head Advil does nothing it does not provide any relief so he really does not feel like  it is musculoskeletal.  It started after his last COVID-vaccine and he is convinced that it was the main trigger.  He is also not happy with the fact that he is not getting any relief with his symptoms.  He is also still having some occasional frontal head pressure and blurry vision that occurs intermittently.  Past Medical History:  Diagnosis Date   AV BLOCK, COMPLETE    s/p PPM (SJM)   Cataract both eyes corrected   Chronic right-sided low back pain with right-sided sciatica 11/09/2019   Emphysema lung ([place] 11/09/2020   ERECTILE DYSFUNCTION, ORGANIC    History of colon polyps    Hypertension    Hypertension     Past Surgical History:  Procedure Laterality Date   CATARACT EXTRACTION W/ INTRAOCULAR LENS IMPLANT  2013   right eye - [facility]   CATARACT EXTRACTION W/ INTRAOCULAR LENS IMPLANT  2015   [place]  COLONOSCOPY  11/29/2015   [facility]   KNEE SURGERY     PACEMAKER PLACEMENT  12/22/96   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, most recent generator ([facility]JM) 05/02/04 [facility]  PERMANENT PACEMAKER GENERATOR CHANGE N/A 07/29/2013   Procedure: PERMANENT PACEMAKER GENERATOR CHANGE;  Surgeon: JCoralyn Mark MD;  [facility];  Service: Cardiovascular;  Laterality: N/A;   VASECTOMY  Family History  Problem Relation Age of Onset   Breast cancer Mother    Heart attack Brother    Diabetes Other    Hodgkin's lymphoma Brother    Colon cancer Neg Hx    Esophageal cancer Neg Hx    Rectal cancer Neg Hx    Stomach cancer Neg Hx     Social History   Socioeconomic History   Marital status: Married    Spouse name: Not on file   Number of children: Not on file   Years of education: Not on file   Highest education level: Not on file  Occupational History   Occupation: Retired  Tobacco Use   Smoking status: Former    Packs/day: 1.50    Years: 35.00    Pack years: 52.50    Types: Cigarettes, Pipe, Cigars    Quit date:  06/10/1985    Years since quitting: 35.9   Smokeless tobacco: Never  Substance and Sexual Activity   Alcohol use: Yes    Alcohol/week: 14.0 standard drinks    Types: 14 Cans of beer per week   Drug use: No   Sexual activity: Never  Other Topics Concern   Not on file  Social History Narrative   Recently retired,Former machinist.  Exercise.    Social Determinants of Health   Financial Resource Strain: Not on file  Food Insecurity: Not on file  Transportation Needs: Not on file  Physical Activity: Not on file  Stress: Not on file  Social Connections: Not on file  Intimate Partner Violence: Not on file    Outpatient Medications Prior to Visit  Medication Sig Dispense Refill   Cholecalciferol (VITAMIN D3) 25 MCG/SPRAY LIQD Take by mouth.     hydrochlorothiazide (HYDRODIURIL) 25 MG tablet Take 1 tablet (25 mg total) by mouth daily. 90 tablet 3   ibuprofen (ADVIL) 200 MG tablet Take 200 mg by mouth as needed.     losartan (COZAAR) 25 MG tablet TAKE 1 TABLET(25 MG) BY MOUTH DAILY (Patient taking differently: Patient states he takes 1/2 tablet once a day) 90 tablet 3   metoprolol succinate (TOPROL-XL) 25 MG 24 hr tablet Take 25 mg by mouth daily.     Multiple Vitamins-Minerals (PRESERVISION AREDS 2 PO) Take by mouth as needed.     Psyllium (METAMUCIL) 28.3 % POWD Take by mouth daily as needed.     No facility-administered medications prior to visit.    Allergies  Allergen Reactions   Lisinopril Other (See Comments)    ROS Review of Systems    Objective:    Physical Exam Constitutional:      Appearance: Normal appearance. He is well-developed.  HENT:     Head: Normocephalic and atraumatic.  Cardiovascular:     Rate and Rhythm: Normal rate and regular rhythm.     Heart sounds: Normal heart sounds.  Pulmonary:     Effort: Pulmonary effort is normal.     Breath sounds: Normal breath sounds.  Skin:    General: Skin is warm and dry.  Neurological:     Mental Status: He  is alert and oriented to person, place, and time. Mental status is at baseline.  Psychiatric:        Behavior: Behavior normal.    BP 134/72 Comment: home BP this AM  Pulse 77   Temp 97.9 F (36.6 C)   Resp 16   Ht 5' 10" (1.778 m)   SpO2 100%   BMI 21.64 kg/m  Wt Readings   from Last 3 Encounters:  03/07/21 150 lb 12.8 oz (68.4 kg)  02/13/21 149 lb 6 oz (67.8 kg)  01/17/21 149 lb (67.6 kg)     There are no preventive care reminders to display for this patient.  There are no preventive care reminders to display for this patient.  Lab Results  Component Value Date   TSH 1.40 04/13/2019   Lab Results  Component Value Date   WBC 6.2 11/09/2020   HGB 16.4 11/09/2020   HCT 47.6 11/09/2020   MCV 93.7 11/09/2020   PLT 198 11/09/2020   Lab Results  Component Value Date   NA 138 03/07/2021   K 3.8 03/07/2021   CO2 25 03/07/2021   GLUCOSE 97 03/07/2021   BUN 16 03/07/2021   CREATININE 1.00 03/07/2021   BILITOT 1.1 05/11/2020   ALKPHOS 73 11/05/2016   AST 20 05/11/2020   ALT 17 05/11/2020   PROT 7.7 05/11/2020   ALBUMIN 4.2 11/05/2016   CALCIUM 9.5 03/07/2021   EGFR 76 03/07/2021   GFR 83.49 07/28/2013   Lab Results  Component Value Date   CHOL 230 (H) 01/09/2021   Lab Results  Component Value Date   HDL 69 01/09/2021   Lab Results  Component Value Date   LDLCALC 139 (H) 01/09/2021   Lab Results  Component Value Date   TRIG 128 01/09/2021   Lab Results  Component Value Date   CHOLHDL 3.3 01/09/2021   Lab Results  Component Value Date   HGBA1C 5.2 05/07/2016      Assessment & Plan:   Problem List Items Addressed This Visit       Cardiovascular and Mediastinum   Essential hypertension, benign - Primary    Home blood pressures mostly in the 130s and 140s.  He has a couple of lower blood pressures around 115 and 120.  Some of them are in the low 150s.  But most of them are under 140.  The average blood pressure was 135/70 with a pulse of 69.   Blood pressure here is a little bit elevated but again home pressures on average look okay.  He says he is actually splitting his losartan but per the MyChart note it looks like he is actually splitting the metoprolol so I am not sure which when he is actually splitting.        Relevant Orders   BASIC METABOLIC PANEL WITH GFR     Respiratory   Emphysema lung ([place])    I did encourage him to retry the Anoro.  I do think it would really be worthwhile to see if it is helpful now that his other medications are stable if it does trigger more headaches then the pulmonologist was willing to send in a second medication that he could try which I think would be reasonable to see if it really does improve his lung symptoms but did remind him it may take a couple of weeks on the medication to notice a significant improvement.        Digestive   Schatzki's ring    Discussed with him that his barium esophagram did show finding consistent with Schatzki's ring and right now he feels like he is swallowing without significant problems or difficulty but I did discuss with him that he needs to keep an eye on the situation and if over time he feels like he is having more difficulty or improper problems then that will have to be addressed.Marland Kitchen  He says he will  keep an eye out and let me know if symptoms worsen or progress.        Other   Atypical chest pain    He still having chest pressure and pain that he feels is limiting his ability to do things.  Unsure how much of this is long versus cardiac related he just feels frustrated that he has been seeing the specialist but is not really getting any improvement or relief that is actually improving his quality of life.  He says that he had a follow-up appointment in January with the cardiologist but had to reschedule and unfortunately did not have anything else for 3 more months out.  He would prefer to see someone more locally anyway he does not want to drive a long  distance to be seen.  Place [facility].      Relevant Orders   Ambulatory referral to Cardiology   Other Visit Diagnoses     Nonintractable headache, unspecified chronicity pattern, unspecified headache type           Still getting occasional headaches and blurry vision-unclear etiology.  Consider visual problems.  Just need to make sure he keeps yearly appointment with his eye doctor.  Also consider other causes such as subacute or chronic sinusitis.  No orders of the defined types were placed in this encounter.   Follow-up: Return in about 6 months (around 11/13/2021) for bp.    Beatrice Lecher, MD

## 2021-05-15 NOTE — Assessment & Plan Note (Addendum)
Home blood pressures mostly in the 130s and 140s.  He has a couple of lower blood pressures around 115 and 120.  Some of them are in the low 150s.  But most of them are under 140.  The average blood pressure was 135/70 with a pulse of 69.  Blood pressure here is a little bit elevated but again home pressures on average look okay.  He says he is actually splitting his losartan but per the MyChart note it looks like he is actually splitting the metoprolol so I am not sure which when he is actually splitting.

## 2021-05-15 NOTE — Telephone Encounter (Signed)
These call patient and see if he would consider doing some imaging of his head since he is still continuing to have headaches and intermittent blurry vision.  If he is okay with that then please let me know and I will place an order.

## 2021-05-15 NOTE — Progress Notes (Signed)
Patient states he takes 1/2 tablet daily of Losartan. Patient states Losartan dose was changed by Cardiologist Dr. Nechama Guard.

## 2021-05-15 NOTE — Assessment & Plan Note (Signed)
Discussed with him that his barium esophagram did show finding consistent with Schatzki's ring and right now he feels like he is swallowing without significant problems or difficulty but I did discuss with him that he needs to keep an eye on the situation and if over time he feels like he is having more difficulty or improper problems then that will have to be addressed.Donald Patel  He says he will keep an eye out and let me know if symptoms worsen or progress.

## 2021-05-16 ENCOUNTER — Other Ambulatory Visit: Payer: Self-pay

## 2021-05-16 MED ORDER — LOSARTAN POTASSIUM 25 MG PO TABS: 25.0000 mg | ORAL_TABLET | Freq: Every day | ORAL | 3 refills | Status: DC

## 2021-05-16 MED ORDER — METOPROLOL SUCCINATE ER 25 MG PO TB24: 12.5000 mg | ORAL_TABLET | Freq: Every day | ORAL | Status: DC

## 2021-05-16 NOTE — Progress Notes (Signed)
Your lab work is within acceptable range and there are no concerning findings.   ?

## 2021-05-16 NOTE — Telephone Encounter (Signed)
Medication list updated.  Charyl Bigger, CMA

## 2021-05-16 NOTE — Telephone Encounter (Signed)
Orders Placed This Encounter  Procedures   CT HEAD W & WO CONTRAST (5MM)    Prefer to get MRI but patient says he has a pacemaker and cannot have an MRI.    Standing Status:   Future    Standing Expiration Date:   05/16/2022    Order Specific Question:   If indicated for the ordered procedure, I authorize the administration of contrast media per Radiology protocol    Answer:   Yes    Order Specific Question:   Preferred imaging location?    Answer:   [facility]

## 2021-05-16 NOTE — Telephone Encounter (Signed)
Please update med list.  He is not splitting the losartan, but he is the metoprolol.

## 2021-05-21 ENCOUNTER — Ambulatory Visit (INDEPENDENT_AMBULATORY_CARE_PROVIDER_SITE_OTHER): Payer: Medicare Other

## 2021-05-21 ENCOUNTER — Other Ambulatory Visit: Payer: Self-pay

## 2021-05-21 DIAGNOSIS — R519 Headache, unspecified: Secondary | ICD-10-CM

## 2021-05-21 DIAGNOSIS — H538 Other visual disturbances: Secondary | ICD-10-CM | POA: Diagnosis not present

## 2021-05-21 DIAGNOSIS — G319 Degenerative disease of nervous system, unspecified: Secondary | ICD-10-CM | POA: Diagnosis not present

## 2021-05-21 MED ORDER — IOHEXOL 300 MG/ML  SOLN
100.0000 mL | Freq: Once | INTRAMUSCULAR | Status: AC | PRN
  Administered 2021-05-21: 75 mL via INTRAVENOUS

## 2021-05-21 NOTE — Progress Notes (Signed)
Hi Donald Patel, CT of the head overall looks good no sign of mass or extra fluid.  There is a little bit of what is called generalized atrophy this is not surprising based on your age so it is not out of the normal range.  No sign of persistent sinusitis or fluid which is great.  Thing specific to explain your headaches.

## 2021-05-31 ENCOUNTER — Ambulatory Visit (INDEPENDENT_AMBULATORY_CARE_PROVIDER_SITE_OTHER): Payer: Medicare Other

## 2021-05-31 DIAGNOSIS — I442 Atrioventricular block, complete: Secondary | ICD-10-CM

## 2021-05-31 LAB — CUP PACEART REMOTE DEVICE CHECK
Battery Remaining Longevity: 22 mo
Battery Remaining Percentage: 19 %
Battery Voltage: 2.89 V
Brady Statistic AP VP Percent: 23 %
Brady Statistic AP VS Percent: 1 %
Brady Statistic AS VP Percent: 77 %
Brady Statistic AS VS Percent: 1 %
Brady Statistic RA Percent Paced: 22 %
Brady Statistic RV Percent Paced: 99 %
Date Time Interrogation Session: 20221222040353
Implantable Lead Implant Date: 20051123
Implantable Lead Implant Date: 20051123
Implantable Lead Location: 753859
Implantable Lead Location: 753860
Implantable Pulse Generator Implant Date: 20150219
Lead Channel Impedance Value: 430 Ohm
Lead Channel Impedance Value: 490 Ohm
Lead Channel Pacing Threshold Amplitude: 0.5 V
Lead Channel Pacing Threshold Amplitude: 1.125 V
Lead Channel Pacing Threshold Pulse Width: 0.5 ms
Lead Channel Pacing Threshold Pulse Width: 0.5 ms
Lead Channel Sensing Intrinsic Amplitude: 12 mV
Lead Channel Sensing Intrinsic Amplitude: 3.9 mV
Lead Channel Setting Pacing Amplitude: 1.375
Lead Channel Setting Pacing Amplitude: 2 V
Lead Channel Setting Pacing Pulse Width: 0.5 ms
Lead Channel Setting Sensing Sensitivity: 5 mV
Pulse Gen Model: 2240
Pulse Gen Serial Number: 7596156

## 2021-06-12 ENCOUNTER — Ambulatory Visit: Payer: Medicare Other | Admitting: Cardiology

## 2021-06-12 NOTE — Progress Notes (Signed)
Remote pacemaker transmission.   

## 2021-06-21 NOTE — Progress Notes (Signed)
HPI: Follow-up chronic combined systolic/diastolic congestive heart failure, history of pacemaker, hypertension and paroxysmal atrial fibrillation.  Previously followed by Dr. Gardiner Rhyme.  Last echocardiogram June 2022 showed ejection fraction 40 to 45%, trace aortic insufficiency and mildly dilated aortic root at 40 mm.  Cardiac CTA August 2022 showed calcium score 0, minimal plaque in the first diagonal and distal myocardial bridge.  There was note of aortic atherosclerosis.  Since last seen he has some dyspnea on exertion but no orthopnea, PND, pedal edema, palpitations or syncope.  He does have chest pressure occasionally which is unchanged.  Current Outpatient Medications  Medication Sig Dispense Refill   Cholecalciferol (VITAMIN D3) 25 MCG/SPRAY LIQD Take by mouth.     hydrochlorothiazide (HYDRODIURIL) 25 MG tablet Take 1 tablet (25 mg total) by mouth daily. 90 tablet 3   ibuprofen (ADVIL) 200 MG tablet Take 200 mg by mouth as needed.     losartan (COZAAR) 25 MG tablet Take 1 tablet (25 mg total) by mouth daily. TAKE 1 TABLET(25 MG) BY MOUTH DAILY Strength: 25 mg 90 tablet 3   metoprolol succinate (TOPROL-XL) 25 MG 24 hr tablet Take 0.5 tablets (12.5 mg total) by mouth daily.     Multiple Vitamins-Minerals (PRESERVISION AREDS 2 PO) Take by mouth as needed.     Psyllium (METAMUCIL) 28.3 % POWD Take by mouth daily as needed.     No current facility-administered medications for this visit.     Past Medical History:  Diagnosis Date   AV BLOCK, COMPLETE    s/p PPM (SJM)   Cataract both eyes corrected   Chronic right-sided low back pain with right-sided sciatica 11/09/2019   Emphysema lung ([place]) 11/09/2020   ERECTILE DYSFUNCTION, ORGANIC    History of colon polyps    Hypertension    Hypertension     Past Surgical History:  Procedure Laterality Date   CATARACT EXTRACTION W/ INTRAOCULAR LENS IMPLANT  2013   right eye - [facility]   CATARACT EXTRACTION W/ INTRAOCULAR LENS IMPLANT   2015   [place] eye - [place]   COLONOSCOPY  11/29/2015   [facility]   KNEE SURGERY     PACEMAKER PLACEMENT  12/22/96   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, most recent generator (SJM) 05/02/04 both implanted by Dr [facility]becka [facility]   PERMANENT PACEMAKER GENERATOR CHANGE N/A 07/29/2013   Procedure: PERMANENT PACEMAKER GENERATOR CHANGE;  Surgeon: Coralyn Mark, MD;  Location: [place] [facility]ATH [facility];  Service: Cardiovascular;  Laterality: N/A;   VASECTOMY      Social History   Socioeconomic History   Marital status: Married    Spouse name: Not on file   Number of children: Not on file   Years of education: Not on file   Highest education level: Not on file  Occupational History   Occupation: Retired  Tobacco Use   Smoking status: Former    Packs/day: 1.50    Years: 35.00    Pack years: 52.50    Types: Cigarettes, Pipe, Cigars    Quit date: 06/10/1985    Years since quitting: 36.0   Smokeless tobacco: Never  Substance and Sexual Activity   Alcohol use: Yes    Alcohol/week: 14.0 standard drinks    Types: 14 Cans of beer per week   Drug use: No   Sexual activity: Never  Other Topics Concern   Not on file  Social History Narrative   Recently retired,Former Furniture conservator/restorer.  Exercise.    Social Determinants  of Health   Financial Resource Strain: Not on file  Food Insecurity: Not on file  Transportation Needs: Not on file  Physical Activity: Not on file  Stress: Not on file  Social Connections: Not on file  Intimate Partner Violence: Not on file    Family History  Problem Relation Age of Onset   Breast cancer Mother    Heart attack Brother    Diabetes Other    Hodgkin's lymphoma Brother    Colon cancer Neg Hx    Esophageal cancer Neg Hx    Rectal cancer Neg Hx    Stomach cancer Neg Hx     ROS: no fevers or chills, productive cough, hemoptysis, dysphasia, odynophagia, melena, hematochezia, dysuria, hematuria, rash, seizure activity, orthopnea, PND, pedal edema,  claudication. Remaining systems are negative.  Physical Exam: Well-developed well-nourished in no acute distress.  Skin is warm and dry.  HEENT is normal.  Neck is supple.  Chest is clear to auscultation with normal expansion.  Cardiovascular exam is regular rate and rhythm.  Abdominal exam nontender or distended. No masses palpated. Extremities show no edema. neuro grossly intact  ECG-sinus rhythm with ventricular pacing.  Personally reviewed  A/P  1 chronic combined systolic/diastolic congestive heart failure-he is euvolemic on examination.  We will continue diuretic at present dose.  We discussed [place] [facility] but he would prefer to avoid at this point.  2 nonischemic cardiomyopathy-LV function mildly reduced.  Continue beta-blocker and losartan.  Previous CTA showed no coronary disease.  3 paroxysmal atrial fibrillation-he is in sinus rhythm today.  In reviewing previous notes he had episodes lasting less than 30 seconds and the plan would be to initiate anticoagulation if the burden increases.  Continue beta-blocker.  4 hypertension-blood pressure elevated; increase losartan to 50 mg daily and follow blood pressure.  Check potassium and renal function in 1 week.  5 history of hyperlipidemia-Per primary care.  Kirk Ruths, MD

## 2021-07-02 ENCOUNTER — Encounter: Payer: Self-pay | Admitting: Cardiology

## 2021-07-02 ENCOUNTER — Other Ambulatory Visit: Payer: Self-pay

## 2021-07-02 ENCOUNTER — Ambulatory Visit: Payer: Medicare Other | Admitting: Cardiology

## 2021-07-02 VITALS — BP 170/90 | HR 76 | Ht 70.0 in | Wt 149.1 lb

## 2021-07-02 DIAGNOSIS — E785 Hyperlipidemia, unspecified: Secondary | ICD-10-CM

## 2021-07-02 DIAGNOSIS — I480 Paroxysmal atrial fibrillation: Secondary | ICD-10-CM

## 2021-07-02 DIAGNOSIS — I10 Essential (primary) hypertension: Secondary | ICD-10-CM

## 2021-07-02 DIAGNOSIS — I420 Dilated cardiomyopathy: Secondary | ICD-10-CM

## 2021-07-02 DIAGNOSIS — I5042 Chronic combined systolic (congestive) and diastolic (congestive) heart failure: Secondary | ICD-10-CM | POA: Diagnosis not present

## 2021-07-02 MED ORDER — LOSARTAN POTASSIUM 50 MG PO TABS: 50.0000 mg | ORAL_TABLET | Freq: Every day | ORAL | 3 refills | Status: DC

## 2021-07-02 NOTE — Patient Instructions (Signed)
Medication Instructions:   INCREASE LOSARTAN TO 50 MG ONCE DAILY= 2 OF THE 25 MG TABLETS ONCE DAILY  *If you need a refill on your cardiac medications before your next appointment, please call your pharmacy*   Lab Work:  Your physician recommends that you return for lab work in: [place]  If you have labs (blood work) drawn today and your tests are completely normal, you will receive your results only by: [employer] (if you have MyChart) OR A paper copy in the mail If you have any lab test that is abnormal or we need to change your treatment, we will call you to review the results.   Follow-Up: At [facility], you and your health needs are our priority.  As part of our continuing mission to provide you with exceptional heart care, we have created designated Provider Care Teams.  These Care Teams include your primary Cardiologist (physician) and Advanced Practice Providers (APPs -  Physician Assistants and Nurse Practitioners) who all work together to provide you with the care you need, when you need it.  We recommend signing up for the patient portal called "MyChart".  Sign up information is provided on this After Visit Summary.  MyChart is used to connect with patients for Virtual Visits (Telemedicine).  Patients are able to view lab/test results, encounter notes, upcoming appointments, etc.  Non-urgent messages can be sent to your provider as well.   To learn more about what you can do with MyChart, go to [email or web address].    Your next appointment:   6 month(s)  The format for your next appointment:   In Person  Provider:   Kirk Ruths MD

## 2021-07-04 ENCOUNTER — Encounter: Payer: Self-pay | Admitting: Cardiology

## 2021-07-04 NOTE — Telephone Encounter (Signed)
Lelon Perla, MD  You 9 minutes ago (10:43 AM)   Pacemaker FU (lives in [place] so should be with [facility] when he is due)  Kirk Ruths

## 2021-07-04 NOTE — Telephone Encounter (Signed)
Patient see's Dr. Rayann Heman but is told to follow up with Dr. Curt Bears in [place] now. Not sure how to go about scheduling this.

## 2021-07-09 DIAGNOSIS — I5042 Chronic combined systolic (congestive) and diastolic (congestive) heart failure: Secondary | ICD-10-CM | POA: Diagnosis not present

## 2021-07-10 LAB — BASIC METABOLIC PANEL
BUN: 16 mg/dL (ref 7–25)
CO2: 29 mmol/L (ref 20–32)
Calcium: 9.4 mg/dL (ref 8.6–10.3)
Chloride: 99 mmol/L (ref 98–110)
Creat: 1.15 mg/dL (ref 0.70–1.22)
Glucose, Bld: 94 mg/dL (ref 65–139)
Potassium: 3.7 mmol/L (ref 3.5–5.3)
Sodium: 137 mmol/L (ref 135–146)

## 2021-07-25 ENCOUNTER — Encounter: Payer: Self-pay | Admitting: Cardiology

## 2021-07-25 DIAGNOSIS — I5042 Chronic combined systolic (congestive) and diastolic (congestive) heart failure: Secondary | ICD-10-CM

## 2021-07-25 MED ORDER — LOSARTAN POTASSIUM 50 MG PO TABS: 50.0000 mg | ORAL_TABLET | Freq: Every day | ORAL | 3 refills | Status: DC

## 2021-08-03 ENCOUNTER — Other Ambulatory Visit: Payer: Self-pay | Admitting: Cardiology

## 2021-08-17 DIAGNOSIS — H527 Unspecified disorder of refraction: Secondary | ICD-10-CM | POA: Diagnosis not present

## 2021-08-17 DIAGNOSIS — Z961 Presence of intraocular lens: Secondary | ICD-10-CM | POA: Diagnosis not present

## 2021-08-17 DIAGNOSIS — H26493 Other secondary cataract, bilateral: Secondary | ICD-10-CM | POA: Diagnosis not present

## 2021-08-17 DIAGNOSIS — H35373 Puckering of macula, bilateral: Secondary | ICD-10-CM | POA: Diagnosis not present

## 2021-08-30 ENCOUNTER — Ambulatory Visit (INDEPENDENT_AMBULATORY_CARE_PROVIDER_SITE_OTHER): Payer: Medicare Other

## 2021-08-30 DIAGNOSIS — I420 Dilated cardiomyopathy: Secondary | ICD-10-CM | POA: Diagnosis not present

## 2021-08-30 DIAGNOSIS — I442 Atrioventricular block, complete: Secondary | ICD-10-CM

## 2021-08-31 LAB — CUP PACEART REMOTE DEVICE CHECK
Battery Remaining Longevity: 19 mo
Battery Remaining Percentage: 17 %
Battery Voltage: 2.87 V
Brady Statistic AP VP Percent: 22 %
Brady Statistic AP VS Percent: 1 %
Brady Statistic AS VP Percent: 78 %
Brady Statistic AS VS Percent: 1 %
Brady Statistic RA Percent Paced: 22 %
Brady Statistic RV Percent Paced: 99 %
Date Time Interrogation Session: 20230323020015
Implantable Lead Implant Date: 20051123
Implantable Lead Implant Date: 20051123
Implantable Lead Location: 753859
Implantable Lead Location: 753860
Implantable Pulse Generator Implant Date: 20150219
Lead Channel Impedance Value: 440 Ohm
Lead Channel Impedance Value: 490 Ohm
Lead Channel Pacing Threshold Amplitude: 0.5 V
Lead Channel Pacing Threshold Amplitude: 1.375 V
Lead Channel Pacing Threshold Pulse Width: 0.5 ms
Lead Channel Pacing Threshold Pulse Width: 0.5 ms
Lead Channel Sensing Intrinsic Amplitude: 12 mV
Lead Channel Sensing Intrinsic Amplitude: 3.9 mV
Lead Channel Setting Pacing Amplitude: 1.625
Lead Channel Setting Pacing Amplitude: 2 V
Lead Channel Setting Pacing Pulse Width: 0.5 ms
Lead Channel Setting Sensing Sensitivity: 5 mV
Pulse Gen Model: 2240
Pulse Gen Serial Number: 7596156

## 2021-09-07 NOTE — Progress Notes (Signed)
Remote pacemaker transmission.   

## 2021-09-10 ENCOUNTER — Other Ambulatory Visit: Payer: Self-pay | Admitting: Student

## 2021-10-29 ENCOUNTER — Ambulatory Visit (INDEPENDENT_AMBULATORY_CARE_PROVIDER_SITE_OTHER): Payer: Medicare Other | Admitting: Cardiology

## 2021-10-29 ENCOUNTER — Encounter: Payer: Self-pay | Admitting: Cardiology

## 2021-10-29 VITALS — BP 138/70 | HR 72 | Ht 70.0 in | Wt 148.0 lb

## 2021-10-29 DIAGNOSIS — I442 Atrioventricular block, complete: Secondary | ICD-10-CM | POA: Diagnosis not present

## 2021-10-29 NOTE — Progress Notes (Signed)
Electrophysiology Office Note   Date:  10/29/2021   ID:  Donald Patel, DOB 05-23-1941, MRN [id number]  PCP:  Donald Marry, MD  Cardiologist:  Stanford Breed Primary Electrophysiologist:  Donald Patel Donald Leeds, MD    Chief Complaint: pacemaker   History of Present Illness: Donald Patel is a 81 y.o. male who is being seen today for the evaluation of pacemaker at the request of Donald Patel, *. Presenting today for electrophysiology evaluation.  He has a history significant for complete heart block status post [facility] Jude dual-chamber pacemaker, mild systolic heart failure, hypertension, hyperlipidemia.  He also has paroxysmal atrial fibrillation.  He has had a low burden of atrial fibrillation and thus not anticoagulated.  Today, he denies symptoms of palpitations, chest pain, shortness of breath, orthopnea, PND, lower extremity edema, claudication, dizziness, presyncope, syncope, bleeding, or neurologic sequela. The patient is tolerating medications without difficulties.  He feels that his heart is doing okay, though he does have a myriad of complaints.  He attributes all of his complaints to his COVID-vaccine received 2 years ago.  He says that he feels somewhat foggy with a baseline shortness of breath, fatigue, visual changes, joint aches.   Past Medical History:  Diagnosis Date   AV BLOCK, COMPLETE    s/p PPM (SJM)   Cataract both eyes corrected   Chronic right-sided low back pain with right-sided sciatica 11/09/2019   Emphysema lung ([place]) 11/09/2020   ERECTILE DYSFUNCTION, ORGANIC    History of colon polyps    Hypertension    Hypertension    Past Surgical History:  Procedure Laterality Date   CATARACT EXTRACTION W/ INTRAOCULAR LENS IMPLANT  2013   right eye - [facility]   CATARACT EXTRACTION W/ INTRAOCULAR LENS IMPLANT  2015   [facility] - [facility]   COLONOSCOPY  11/29/2015   [facility]   KNEE SURGERY     PACEMAKER PLACEMENT  12/22/96    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, most recent generator (SJM) 05/02/04 both implanted by Dr Rebecka Apley [facility]   PERMANENT PACEMAKER GENERATOR CHANGE N/A 07/29/2013   Procedure: PERMANENT PACEMAKER GENERATOR CHANGE;  Surgeon: Coralyn Mark, MD;  Location: [facility] CATH LAB;  Service: Cardiovascular;  Laterality: N/A;   VASECTOMY       Current Outpatient Medications  Medication Sig Dispense Refill   Cholecalciferol (VITAMIN D3) 25 MCG/SPRAY LIQD Take by mouth.     hydrochlorothiazide (HYDRODIURIL) 25 MG tablet TAKE 1 TABLET(25 MG) BY MOUTH DAILY 90 tablet 2   ibuprofen (ADVIL) 200 MG tablet Take 200 mg by mouth as needed.     losartan (COZAAR) 50 MG tablet Take 1 tablet (50 mg total) by mouth daily. 90 tablet 3   metoprolol succinate (TOPROL-XL) 25 MG 24 hr tablet TAKE 1 TABLET(25 MG) BY MOUTH DAILY 90 tablet 1   Multiple Vitamins-Minerals (PRESERVISION AREDS 2 PO) Take by mouth as needed.     Psyllium (METAMUCIL) 28.3 % POWD Take by mouth daily as needed.     No current facility-administered medications for this visit.    Allergies:   Lisinopril   Social History:  The patient  reports that he quit smoking about 36 years ago. His smoking use included cigarettes, pipe, and cigars. He has a 52.50 pack-year smoking history. He has never used smokeless tobacco. He reports current alcohol use of about 14.0 standard drinks per week. He reports that he does not use drugs.   Family History:  The patient's family history  includes Breast cancer in his mother; Diabetes in an other family member; Heart attack in his brother; Hodgkin's lymphoma in his brother.    ROS:  Please see the history of present illness.   Otherwise, review of systems is positive for none.   All other systems are reviewed and negative.    PHYSICAL EXAM: VS:  BP 138/70   Pulse 72   Ht 5' 10"  (1.778 m)   Wt 148 lb (67.1 kg)   SpO2 98%   BMI 21.24 kg/m  , BMI Body mass index is 21.24 kg/m. GEN: Well nourished, well developed, in no  acute distress  HEENT: normal  Neck: no JVD, carotid bruits, or masses Cardiac: RRR; no murmurs, rubs, or gallops,no edema  Respiratory:  clear to auscultation bilaterally, normal work of breathing GI: soft, nontender, nondistended, + BS MS: no deformity or atrophy  Skin: warm and dry, device pocket is well healed Neuro:  Strength and sensation are intact Psych: euthymic mood, full affect  EKG:  EKG is not ordered today. Personal review of the ekg ordered 01/04/21 shows AV paced  Device interrogation is reviewed today in detail.  See PaceArt for details.   Recent Labs: 11/09/2020: Hemoglobin 16.4; Platelets 198 03/07/2021: Magnesium 1.9 07/09/2021: BUN 16; Creat 1.15; Potassium 3.7; Sodium 137    Lipid Panel     Component Value Date/Time   CHOL 230 (H) 01/09/2021 0906   TRIG 128 01/09/2021 0906   HDL 69 01/09/2021 0906   CHOLHDL 3.3 01/09/2021 0906   CHOLHDL 3.0 05/11/2020 0000   VLDL 17 11/13/2015 0830   LDLCALC 139 (H) 01/09/2021 0906   LDLCALC 123 (H) 05/11/2020 0000     Wt Readings from Last 3 Encounters:  10/29/21 148 lb (67.1 kg)  07/02/21 149 lb 1.9 oz (67.6 kg)  03/07/21 150 lb 12.8 oz (68.4 kg)      Other studies Reviewed: Additional studies/ records that were reviewed today include: TTE 12/06/20  Review of the above records today demonstrates:   1. Left ventricular ejection fraction, by estimation, is 40 to 45%. The  left ventricle has mildly decreased function. The left ventricle has no  regional wall motion abnormalities. Left ventricular diastolic parameters  are indeterminate.   2. Right ventricular systolic function is normal. The right ventricular  size is normal.   3. The mitral valve is normal in structure. No evidence of mitral valve  regurgitation. No evidence of mitral stenosis.   4. The aortic valve is normal in structure. Aortic valve regurgitation is  trivial. No aortic stenosis is present.   5. Aortic dilatation noted. There is dilatation of  the aortic root,  measuring 40 mm.   6. The inferior vena cava is normal in size with greater than 50%  respiratory variability, suggesting right atrial pressure of 3 mmHg.   Coronary CTA 01/15/21 1. Coronary calcium score of 0.   2. Normal coronary origin with left dominance.   3. Minimal non-calcified plaque in the first diagonal (<25%).   4. Normal LCX/RCA.   5. 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   6. Non-ischemic cardiomyopathy.  ASSESSMENT AND PLAN:  1.  Chronic systolic heart failure: Coronary calcium score is 0.  Currently on losartan 25 mg daily, Toprol-XL 25 mg daily.  No obvious volume overload.  2.  Complete heart block: Status post [facility] dual-chamber pacemaker.  Device functioning appropriately.  No changes at this time.  3.  Hypertension: Currently well controlled  Current medicines are reviewed at length with the patient today.   The patient does not have concerns regarding his medicines.  The following changes were made today:  none  Labs/ tests ordered today include:  No orders of the defined types were placed in this encounter.    Disposition:   FU with Peggy Loge 1 year  Signed, Michaelina Blandino Donald Leeds, MD  10/29/2021 2:09 PM     [facility] [address] [place] [address] [phone] (office) [phone] (fax)

## 2021-10-29 NOTE — Patient Instructions (Signed)
Medication Instructions:  Your physician recommends that you continue on your current medications as directed. Please refer to the Current Medication list given to you today.  *If you need a refill on your cardiac medications before your next appointment, please call your pharmacy*   Lab Work: None ordered    Testing/Procedures: None ordered   Follow-Up: At [facility], you and your health needs are our priority.  As part of our continuing mission to provide you with exceptional heart care, we have created designated Provider Care Teams.  These Care Teams include your primary Cardiologist (physician) and Advanced Practice Providers (APPs -  Physician Assistants and Nurse Practitioners) who all work together to provide you with the care you need, when you need it.  Remote monitoring is used to monitor your Pacemaker or ICD from home. This monitoring reduces the number of office visits required to check your device to one time per year. It allows [place] to keep an eye on the functioning of your device to ensure it is working properly. You are scheduled for a device check from home on 11/29/2021. You may send your transmission at any time that day. If you have a wireless device, the transmission will be sent automatically. After your physician reviews your transmission, you will receive a postcard with your next transmission date.  Your next appointment:   1 year(s)  The format for your next appointment:   In Person  Provider:   Allegra Lai, MD    Thank you for choosing [facility]!!   Trinidad Curet, RN [phone]    Other Instructions   Important Information About Sugar

## 2021-11-13 ENCOUNTER — Ambulatory Visit (INDEPENDENT_AMBULATORY_CARE_PROVIDER_SITE_OTHER): Payer: Medicare Other | Admitting: Family Medicine

## 2021-11-13 ENCOUNTER — Encounter: Payer: Self-pay | Admitting: Family Medicine

## 2021-11-13 VITALS — BP 125/60 | HR 60 | Resp 16 | Ht 70.0 in | Wt 148.0 lb

## 2021-11-13 DIAGNOSIS — I10 Essential (primary) hypertension: Secondary | ICD-10-CM

## 2021-11-13 DIAGNOSIS — I442 Atrioventricular block, complete: Secondary | ICD-10-CM | POA: Diagnosis not present

## 2021-11-13 DIAGNOSIS — J439 Emphysema, unspecified: Secondary | ICD-10-CM

## 2021-11-13 DIAGNOSIS — I700 Atherosclerosis of aorta: Secondary | ICD-10-CM

## 2021-11-13 NOTE — Assessment & Plan Note (Signed)
Pacemaker followed by Dr. Curt Bears

## 2021-11-13 NOTE — Assessment & Plan Note (Signed)
Not currently on a statin but he is due to repeat lipids so he will do that in the next couple of weeks.

## 2021-11-13 NOTE — Assessment & Plan Note (Signed)
Well controlled. Continue current regimen. Follow up in  6 mo  

## 2021-11-13 NOTE — Assessment & Plan Note (Signed)
Since having side effects from the Anoro he has been hesitant to try a new inhaler.  He wanted to get his blood pressure medication settled out.  But we did discuss that there are inhalers that can help him maintain his lung function even if they do not necessarily make him feel better on a day-to-day but it can reduce that decline and really encouraged him to consider a trial of a different medication or at least following back up with pulmonary later this summer.

## 2021-11-13 NOTE — Progress Notes (Signed)
Established Patient Office Visit  Subjective   Patient ID: Donald Patel, male    DOB: August 07, 1940  Age: 81 y.o. MRN: [id number]  Chief Complaint  Patient presents with   Hypertension    Follow up     HPI  Hypertension- Pt denies chest pain, SOB, dizziness, or heart palpitations.  Taking meds as directed w/o problems.  Denies medication side effects.    Complete AV block-he has now established care with Dr. Kirk Ruths and has met with Dr. Curt Bears for following up on his pacemaker.  They estimate that he probably has about another year and a half left on his pacemaker.  He checks in and does the download every 3 months.  He is otherwise doing well and feels physically like he is able to do most of what he would like to do.  He feels pretty well controlled on his current medication regimen.  He does split his metoprolol and cardiology is aware of that.  Follow-up centrilobular emphysema-last seen by pulmonary in August 2022.  They did perform hopeful PFTs at that time and recommended an inhaler.  He said that the inhaler made him feel "loopy" and so stopped it.    ROS    Objective:     BP 125/60 (BP Location: Right Arm)   Pulse 60   Resp 16   Ht _0  (1.778 m)   Wt 148 lb (67.1 kg)   SpO2 99%   BMI 21.24 kg/m    Physical Exam Constitutional:      Appearance: He is well-developed.  HENT:     Head: Normocephalic and atraumatic.  Cardiovascular:     Rate and Rhythm: Normal rate and regular rhythm.     Heart sounds: Normal heart sounds.  Pulmonary:     Effort: Pulmonary effort is normal.     Breath sounds: Normal breath sounds.  Skin:    General: Skin is warm and dry.  Neurological:     Mental Status: He is alert and oriented to person, place, and time.  Psychiatric:        Behavior: Behavior normal.     No results found for any visits on 11/13/21.    The ASCVD Risk score (Arnett DK, et al., 2019) failed to calculate for the following reasons:   The  2019 ASCVD risk score is only valid for ages 23 to 20    Assessment & Plan:   Problem List Items Addressed This Visit       Cardiovascular and Mediastinum   Essential hypertension, benign - Primary    Well controlled. Continue current regimen. Follow up in  6 mo        Relevant Orders   COMPLETE METABOLIC PANEL WITH GFR   AV BLOCK, COMPLETE    Pacemaker followed by Dr. Curt Bears       Aortic atherosclerosis [facility])    Not currently on a statin but he is due to repeat lipids so he will do that in the next couple of weeks.       Relevant Orders   Lipid panel     Respiratory   Emphysema lung ([place])    Since having side effects from the Anoro he has been hesitant to try a new inhaler.  He wanted to get his blood pressure medication settled out.  But we did discuss that there are inhalers that can help him maintain his lung function even if they do not necessarily make him feel better on a  day-to-day but it can reduce that decline and really encouraged him to consider a trial of a different medication or at least following back up with pulmonary later this summer.        Return in about 6 months (around 05/15/2022) for BP follow up .   I spent 35 minutes on the day of the encounter to include pre-visit record review, face-to-face time with the patient and post visit ordering of test.   Beatrice Lecher, MD

## 2021-11-15 DIAGNOSIS — I700 Atherosclerosis of aorta: Secondary | ICD-10-CM | POA: Diagnosis not present

## 2021-11-15 DIAGNOSIS — I10 Essential (primary) hypertension: Secondary | ICD-10-CM | POA: Diagnosis not present

## 2021-11-16 LAB — LIPID PANEL
Cholesterol: 209 mg/dL — ABNORMAL HIGH (ref <200)
HDL: 67 mg/dL (ref >=40)
LDL Cholesterol (Calc): 123 mg/dL (calc) — ABNORMAL HIGH
Non-HDL Cholesterol (Calc): 142 mg/dL (calc) — ABNORMAL HIGH (ref <130)
Total CHOL/HDL Ratio: 3.1 (calc) (ref <5.0)
Triglycerides: 89 mg/dL (ref <150)

## 2021-11-16 LAB — COMPLETE METABOLIC PANEL WITH GFR
AG Ratio: 1.4 (calc) (ref 1.0–2.5)
ALT: 19 U/L (ref 9–46)
AST: 22 U/L (ref 10–35)
Albumin: 4.2 g/dL (ref 3.6–5.1)
Alkaline phosphatase (APISO): 73 U/L (ref 35–144)
BUN: 17 mg/dL (ref 7–25)
CO2: 27 mmol/L (ref 20–32)
Calcium: 9.7 mg/dL (ref 8.6–10.3)
Chloride: 100 mmol/L (ref 98–110)
Creat: 1.09 mg/dL (ref 0.70–1.22)
Globulin: 3.1 g/dL (calc) (ref 1.9–3.7)
Glucose, Bld: 99 mg/dL (ref 65–99)
Potassium: 4.6 mmol/L (ref 3.5–5.3)
Sodium: 141 mmol/L (ref 135–146)
Total Bilirubin: 0.8 mg/dL (ref 0.2–1.2)
Total Protein: 7.3 g/dL (ref 6.1–8.1)
eGFR: 68 mL/min/{1.73_m2} (ref >=60)

## 2021-11-16 NOTE — Progress Notes (Signed)
Hi Malekai, LDL cholesterol is mildly elevated similar to a year ago.  I would highly recommend a cholesterol-lowering drug based on these results in addition to the atherosclerosis that you have in your aorta.  If you are okay with starting medication please let me know.  Metabolic panel looks great.

## 2021-11-29 ENCOUNTER — Ambulatory Visit (INDEPENDENT_AMBULATORY_CARE_PROVIDER_SITE_OTHER): Payer: Medicare Other

## 2021-11-29 DIAGNOSIS — I442 Atrioventricular block, complete: Secondary | ICD-10-CM | POA: Diagnosis not present

## 2021-11-29 LAB — CUP PACEART REMOTE DEVICE CHECK
Battery Remaining Longevity: 16 mo
Battery Remaining Percentage: 14 %
Battery Voltage: 2.86 V
Brady Statistic AP VP Percent: 26 %
Brady Statistic AP VS Percent: 1 %
Brady Statistic AS VP Percent: 74 %
Brady Statistic AS VS Percent: 1 %
Brady Statistic RA Percent Paced: 26 %
Brady Statistic RV Percent Paced: 99 %
Date Time Interrogation Session: 20230622055558
Implantable Lead Implant Date: 20051123
Implantable Lead Implant Date: 20051123
Implantable Lead Location: 753859
Implantable Lead Location: 753860
Implantable Pulse Generator Implant Date: 20150219
Lead Channel Impedance Value: 450 Ohm
Lead Channel Impedance Value: 450 Ohm
Lead Channel Pacing Threshold Amplitude: 0.5 V
Lead Channel Pacing Threshold Amplitude: 1.25 V
Lead Channel Pacing Threshold Pulse Width: 0.5 ms
Lead Channel Pacing Threshold Pulse Width: 0.5 ms
Lead Channel Sensing Intrinsic Amplitude: 12 mV
Lead Channel Sensing Intrinsic Amplitude: 4.3 mV
Lead Channel Setting Pacing Amplitude: 1.5 V
Lead Channel Setting Pacing Amplitude: 2 V
Lead Channel Setting Pacing Pulse Width: 0.5 ms
Lead Channel Setting Sensing Sensitivity: 5 mV
Pulse Gen Model: 2240
Pulse Gen Serial Number: 7596156

## 2021-12-06 NOTE — Progress Notes (Signed)
Remote pacemaker transmission.   

## 2022-01-30 ENCOUNTER — Encounter: Payer: Self-pay | Admitting: General Practice

## 2022-02-23 ENCOUNTER — Encounter: Payer: Self-pay | Admitting: Cardiology

## 2022-02-23 DIAGNOSIS — I5042 Chronic combined systolic (congestive) and diastolic (congestive) heart failure: Secondary | ICD-10-CM

## 2022-02-26 MED ORDER — LOSARTAN POTASSIUM 25 MG PO TABS: 25.0000 mg | ORAL_TABLET | Freq: Every day | ORAL | 3 refills | Status: DC

## 2022-02-26 NOTE — Addendum Note (Signed)
Addended by: Cristopher Estimable on: 02/26/2022 01:01 PM   Modules accepted: Orders

## 2022-02-28 ENCOUNTER — Ambulatory Visit (INDEPENDENT_AMBULATORY_CARE_PROVIDER_SITE_OTHER): Payer: Medicare Other

## 2022-02-28 DIAGNOSIS — I420 Dilated cardiomyopathy: Secondary | ICD-10-CM

## 2022-02-28 LAB — CUP PACEART REMOTE DEVICE CHECK
Battery Remaining Longevity: 13 mo
Battery Remaining Percentage: 12 %
Battery Voltage: 2.86 V
Brady Statistic AP VP Percent: 26 %
Brady Statistic AP VS Percent: 1 %
Brady Statistic AS VP Percent: 74 %
Brady Statistic AS VS Percent: 1 %
Brady Statistic RA Percent Paced: 26 %
Brady Statistic RV Percent Paced: 99 %
Date Time Interrogation Session: 20230921024211
Implantable Lead Implant Date: 20051123
Implantable Lead Implant Date: 20051123
Implantable Lead Location: 753859
Implantable Lead Location: 753860
Implantable Pulse Generator Implant Date: 20150219
Lead Channel Impedance Value: 440 Ohm
Lead Channel Impedance Value: 450 Ohm
Lead Channel Pacing Threshold Amplitude: 0.5 V
Lead Channel Pacing Threshold Amplitude: 1.75 V
Lead Channel Pacing Threshold Pulse Width: 0.5 ms
Lead Channel Pacing Threshold Pulse Width: 0.5 ms
Lead Channel Sensing Intrinsic Amplitude: 12 mV
Lead Channel Sensing Intrinsic Amplitude: 5 mV
Lead Channel Setting Pacing Amplitude: 2 V
Lead Channel Setting Pacing Amplitude: 2 V
Lead Channel Setting Pacing Pulse Width: 0.5 ms
Lead Channel Setting Sensing Sensitivity: 5 mV
Pulse Gen Model: 2240
Pulse Gen Serial Number: 7596156

## 2022-03-11 NOTE — Progress Notes (Signed)
Remote pacemaker transmission.   

## 2022-04-29 ENCOUNTER — Telehealth: Payer: Self-pay | Admitting: General Practice

## 2022-04-29 NOTE — Telephone Encounter (Signed)
Patient declined medicare wellness visit at this time.

## 2022-05-05 ENCOUNTER — Other Ambulatory Visit: Payer: Self-pay | Admitting: Cardiology

## 2022-05-16 ENCOUNTER — Encounter: Payer: Self-pay | Admitting: Family Medicine

## 2022-05-21 ENCOUNTER — Ambulatory Visit (INDEPENDENT_AMBULATORY_CARE_PROVIDER_SITE_OTHER): Payer: Medicare Other | Admitting: Family Medicine

## 2022-05-21 ENCOUNTER — Encounter: Payer: Self-pay | Admitting: Family Medicine

## 2022-05-21 VITALS — BP 146/49 | HR 59 | Ht 68.0 in | Wt 148.0 lb

## 2022-05-21 DIAGNOSIS — J439 Emphysema, unspecified: Secondary | ICD-10-CM

## 2022-05-21 DIAGNOSIS — R0789 Other chest pain: Secondary | ICD-10-CM | POA: Diagnosis not present

## 2022-05-21 DIAGNOSIS — I10 Essential (primary) hypertension: Secondary | ICD-10-CM

## 2022-05-21 NOTE — Assessment & Plan Note (Signed)
Repeat blood pressure slightly improved.  Again list from home looks good overall most of his blood pressures are at goal so for now continue with current regimen he is taking HCTZ 25 mg, half a tab of metoprolol 25 mg and 25 mg of losartan.  We did discuss possibly moving the metoprolol to the evening maybe for a week just to see if he feels better after he takes his medication.

## 2022-05-21 NOTE — Assessment & Plan Note (Signed)
Experiencing the significant chest discomfort and pressure that he does feel like affects his ability to be active.  He says he is just tired of not feeling well in general.  He says NSAIDs do not really help his pain or discomfort some really at a loss for what to do to really help him.  He does take ibuprofen at night but says its mostly for his back and his legs and that does help that issue.

## 2022-05-21 NOTE — Progress Notes (Signed)
Established Patient Office Visit  Subjective   Patient ID: Donald Patel, male    DOB: [birth year]  Age: 81 y.o. MRN: [id number]  Chief Complaint  Patient presents with   Hypertension    HPI Hypertension- Pt denies chest pain, SOB, dizziness, or heart palpitations.  Taking meds as directed w/o problems.  Denies medication side effects.  Blood sugar log brought in today.  He had a few low diastolics back in September and then called cardiologist and they cut his metoprolol in half.  Since then blood pressures have mostly been running in the 120s and 585I with diastolics in the 77O and 24M.  Pulse typically in the 60s.  He says sometimes he does not feel well a little while after taking all of his medications.  He takes 2 of his blood pressure pills first thing in the morning and then 30 minutes later takes the third 1.  Still having some discomfort and soreness in his chest that he feels like is affecting his quality of life.  He says it started after he had the COVID-vaccine 2 years ago.  He is no longer using his inhalers as prescribed by pulmonary.  He had side effects and so discontinued them.    ROS    Objective:     BP (!) 146/49 (BP Location: Left Arm, Patient Position: Sitting, Cuff Size: Normal)   Pulse (!) 59   Ht '5\' 8"'$  (1.727 m)   Wt 148 lb 0.6 oz (67.2 kg)   SpO2 99%   BMI 22.51 kg/m    Physical Exam Constitutional:      Appearance: He is well-developed.  HENT:     Head: Normocephalic and atraumatic.  Cardiovascular:     Rate and Rhythm: Normal rate and regular rhythm.     Heart sounds: Normal heart sounds.  Pulmonary:     Effort: Pulmonary effort is normal.     Breath sounds: Normal breath sounds.  Skin:    General: Skin is warm and dry.  Neurological:     Mental Status: He is alert and oriented to person, place, and time.  Psychiatric:        Behavior: Behavior normal.     No results found for any visits on 05/21/22.    The ASCVD Risk score  (Arnett DK, et al., 2019) failed to calculate for the following reasons:   The 2019 ASCVD risk score is only valid for ages 84 to 44    Assessment & Plan:   Problem List Items Addressed This Visit       Cardiovascular and Mediastinum   Essential hypertension, benign - Primary    Repeat blood pressure slightly improved.  Again list from home looks good overall most of his blood pressures are at goal so for now continue with current regimen he is taking HCTZ 25 mg, half a tab of metoprolol 25 mg and 25 mg of losartan.  We did discuss possibly moving the metoprolol to the evening maybe for a week just to see if he feels better after he takes his medication.      Relevant Orders   BASIC METABOLIC PANEL WITH GFR     Respiratory   Emphysema lung ([place])    Not actively on treatment.  He denies significant shortness of breath with activities.  It looks that he had evidence of emphysema back on CT scan from 2019.  Again he feels like he is not symptomatic with his breathing right now and so  wants to hold off on any inhalers.        Other   Atypical chest pain    Experiencing the significant chest discomfort and pressure that he does feel like affects his ability to be active.  He says he is just tired of not feeling well in general.  He says NSAIDs do not really help his pain or discomfort some really at a loss for what to do to really help him.  He does take ibuprofen at night but says its mostly for his back and his legs and that does help that issue.       Return in about 6 months (around 11/20/2022) for Hypertension.    Beatrice Lecher, MD

## 2022-05-21 NOTE — Assessment & Plan Note (Addendum)
Not actively on treatment.  He denies significant shortness of breath with activities.  It looks that he had evidence of emphysema back on CT scan from 2019.  Again he feels like he is not symptomatic with his breathing right now and so wants to hold off on any inhalers.

## 2022-05-22 LAB — BASIC METABOLIC PANEL WITH GFR
BUN: 15 mg/dL (ref 7–25)
CO2: 30 mmol/L (ref 20–32)
Calcium: 9.6 mg/dL (ref 8.6–10.3)
Chloride: 100 mmol/L (ref 98–110)
Creat: 1.07 mg/dL (ref 0.70–1.22)
Glucose, Bld: 91 mg/dL (ref 65–99)
Potassium: 4.1 mmol/L (ref 3.5–5.3)
Sodium: 139 mmol/L (ref 135–146)
eGFR: 69 mL/min/{1.73_m2} (ref >=60)

## 2022-05-22 NOTE — Progress Notes (Signed)
Your lab work is within acceptable range and there are no concerning findings.   ?

## 2022-05-30 ENCOUNTER — Ambulatory Visit (INDEPENDENT_AMBULATORY_CARE_PROVIDER_SITE_OTHER): Payer: Medicare Other

## 2022-05-30 DIAGNOSIS — I420 Dilated cardiomyopathy: Secondary | ICD-10-CM

## 2022-05-30 LAB — CUP PACEART REMOTE DEVICE CHECK
Battery Remaining Longevity: 12 mo
Battery Remaining Percentage: 11 %
Battery Voltage: 2.84 V
Brady Statistic AP VP Percent: 26 %
Brady Statistic AP VS Percent: 1 %
Brady Statistic AS VP Percent: 74 %
Brady Statistic AS VS Percent: 1 %
Brady Statistic RA Percent Paced: 26 %
Brady Statistic RV Percent Paced: 99 %
Date Time Interrogation Session: 20231221020014
Implantable Lead Connection Status: 753985
Implantable Lead Connection Status: 753985
Implantable Lead Implant Date: 20051123
Implantable Lead Implant Date: 20051123
Implantable Lead Location: 753859
Implantable Lead Location: 753860
Implantable Pulse Generator Implant Date: 20150219
Lead Channel Impedance Value: 450 Ohm
Lead Channel Impedance Value: 460 Ohm
Lead Channel Pacing Threshold Amplitude: 0.5 V
Lead Channel Pacing Threshold Amplitude: 1.75 V
Lead Channel Pacing Threshold Pulse Width: 0.5 ms
Lead Channel Pacing Threshold Pulse Width: 0.5 ms
Lead Channel Sensing Intrinsic Amplitude: 12 mV
Lead Channel Sensing Intrinsic Amplitude: 4.9 mV
Lead Channel Setting Pacing Amplitude: 2 V
Lead Channel Setting Pacing Amplitude: 2 V
Lead Channel Setting Pacing Pulse Width: 0.5 ms
Lead Channel Setting Sensing Sensitivity: 5 mV
Pulse Gen Model: 2240
Pulse Gen Serial Number: 7596156

## 2022-06-11 NOTE — Progress Notes (Signed)
HPI: Follow-up chronic combined systolic/diastolic congestive heart failure, history of pacemaker, hypertension and paroxysmal atrial fibrillation.  Last echocardiogram June 2022 showed ejection fraction 40 to 45%, trace aortic insufficiency and mildly dilated aortic root at 40 mm.  Cardiac CTA August 2022 showed calcium score 0, minimal plaque in the first diagonal and distal myocardial bridge.  There was note of aortic atherosclerosis.  Since last seen he denies dyspnea, chest pain, palpitations or syncope.  Current Outpatient Medications  Medication Sig Dispense Refill   Cholecalciferol (VITAMIN D3) 25 MCG/SPRAY LIQD Take by mouth.     hydrochlorothiazide (HYDRODIURIL) 25 MG tablet TAKE 1 TABLET(25 MG) BY MOUTH DAILY 90 tablet 2   ibuprofen (ADVIL) 200 MG tablet Take 200 mg by mouth as needed.     losartan (COZAAR) 25 MG tablet Take 1 tablet (25 mg total) by mouth daily. 90 tablet 3   metoprolol succinate (TOPROL-XL) 25 MG 24 hr tablet Take 12.5 mg by mouth daily.     Multiple Vitamins-Minerals (PRESERVISION AREDS 2 PO) Take by mouth as needed.     Psyllium (METAMUCIL) 28.3 % POWD Take by mouth daily as needed.     No current facility-administered medications for this visit.     Past Medical History:  Diagnosis Date   AV BLOCK, COMPLETE    s/p PPM (SJM)   Cataract both eyes corrected   Chronic right-sided low back pain with right-sided sciatica 11/09/2019   Emphysema lung ([place]) 11/09/2020   ERECTILE DYSFUNCTION, ORGANIC    History of colon polyps    Hypertension    Hypertension     Past Surgical History:  Procedure Laterality Date   CATARACT EXTRACTION W/ INTRAOCULAR LENS IMPLANT  2013   right eye - [facility]   CATARACT EXTRACTION W/ INTRAOCULAR LENS IMPLANT  2015   Left eye - [place]   COLONOSCOPY  11/29/2015   [facility]   KNEE SURGERY     PACEMAKER PLACEMENT  12/22/96   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, most recent generator ([facility]JM) 05/02/04 both  implanted by Dr Rebecka Apley [facility]   PERMANENT PACEMAKER GENERATOR CHANGE N/A 07/29/2013   Procedure: PERMANENT PACEMAKER GENERATOR CHANGE;  Surgeon: Coralyn Mark, MD;  Location: [facility] CATH [facility];  Service: Cardiovascular;  Laterality: N/A;   VASECTOMY      Social History   Socioeconomic History   Marital status: Married    Spouse name: Not on file   Number of children: Not on file   Years of education: Not on file   Highest education level: Not on file  Occupational History   Occupation: Retired  Tobacco Use   Smoking status: Former    Packs/day: 1.50    Years: 35.00    Total pack years: 52.50    Types: Cigarettes, Pipe, Cigars    Quit date: 06/10/1985    Years since quitting: 37.0   Smokeless tobacco: Never  Substance and Sexual Activity   Alcohol use: Yes    Alcohol/week: 14.0 standard drinks of alcohol    Types: 14 Cans of beer per week   Drug use: No   Sexual activity: Never  Other Topics Concern   Not on file  Social History Narrative   Recently retired,Former Furniture conservator/restorer.  Exercise.    Social Determinants of Health   Financial Resource Strain: Not on file  Food Insecurity: Not on file  Transportation Needs: Not on file  Physical Activity: Not on file  Stress: Not on file  Social Connections:  Not on file  Intimate Partner Violence: Not on file    Family History  Problem Relation Age of Onset   Breast cancer Mother    Heart attack Brother    Diabetes Other    Hodgkin's lymphoma Brother    Colon cancer Neg Hx    Esophageal cancer Neg Hx    Rectal cancer Neg Hx    Stomach cancer Neg Hx     ROS: General aching since COVID-vaccine but no fevers or chills, productive cough, hemoptysis, dysphasia, odynophagia, melena, hematochezia, dysuria, hematuria, rash, seizure activity, orthopnea, PND, pedal edema, claudication. Remaining systems are negative.  Physical Exam: Well-developed well-nourished in no acute distress.  Skin is warm and dry.  HEENT is normal.  Neck is  supple.  Chest is clear to auscultation with normal expansion.  Cardiovascular exam is regular rate and rhythm.  Abdominal exam nontender or distended. No masses palpated. Extremities show trace edema. neuro grossly intact  Electrocardiogram shows sinus rhythm with ventricular pacing, personally reviewed.  A/P  1 chronic combined systolic/diastolic congestive heart failure-patient remains euvolemic.  Continue diuretic at present dose.  He declined SGLT2 inhibitor previously.  2 nonischemic cardiomyopathy-LV function mildly reduced on previous echocardiogram.  Will repeat study.  Continue ARB and beta-blocker.  Note previous CTA did not show obstructive coronary disease.  3 paroxysmal atrial fibrillation-patient is having short episodes of atrial fibrillation at time of device interrogation.  He is in sinus rhythm today. As outlined previously plan was to initiate anticoagulation if burden increased (less than 1% previously).  He has continued to have episodes of atrial arrhythmias.  I am concerned about the potential for embolic event.  Will begin apixaban 5 mg twice daily.  Continue beta-blocker.  4 hypertension-blood pressure elevated; however he has not taken his medications yet this morning.  Will continue and follow.  5 hyperlipidemia-Per primary care.  6 status post pacemaker-Per electrophysiology.  Kirk Ruths, MD

## 2022-06-21 NOTE — Progress Notes (Signed)
Remote pacemaker transmission.   

## 2022-06-24 ENCOUNTER — Encounter: Payer: Self-pay | Admitting: Cardiology

## 2022-06-24 ENCOUNTER — Ambulatory Visit: Payer: Medicare Other | Admitting: Cardiology

## 2022-06-24 VITALS — BP 152/74 | HR 82 | Ht 68.0 in | Wt 147.8 lb

## 2022-06-24 DIAGNOSIS — I420 Dilated cardiomyopathy: Secondary | ICD-10-CM | POA: Diagnosis not present

## 2022-06-24 DIAGNOSIS — I480 Paroxysmal atrial fibrillation: Secondary | ICD-10-CM

## 2022-06-24 DIAGNOSIS — I5042 Chronic combined systolic (congestive) and diastolic (congestive) heart failure: Secondary | ICD-10-CM

## 2022-06-24 DIAGNOSIS — I10 Essential (primary) hypertension: Secondary | ICD-10-CM | POA: Diagnosis not present

## 2022-06-24 DIAGNOSIS — E785 Hyperlipidemia, unspecified: Secondary | ICD-10-CM | POA: Diagnosis not present

## 2022-06-24 MED ORDER — APIXABAN 5 MG PO TABS: 5.0000 mg | ORAL_TABLET | Freq: Two times a day (BID) | ORAL | 6 refills | Status: DC

## 2022-06-24 MED ORDER — HYDROCHLOROTHIAZIDE 25 MG PO TABS: 25.0000 mg | ORAL_TABLET | Freq: Every day | ORAL | 3 refills | Status: DC

## 2022-06-24 NOTE — Patient Instructions (Signed)
Medication Instructions:   START ELIQUIS 5 MG ONE TABLET TWICE DAILY  *If you need a refill on your cardiac medications before your next appointment, please call your pharmacy*   Testing/Procedures:  Your physician has requested that you have an echocardiogram. Echocardiography is a painless test that uses sound waves to create images of your heart. It provides your doctor with information about the size and shape of your heart and how well your heart's chambers and valves are working. This procedure takes approximately one hour. There are no restrictions for this procedure. Please do NOT wear cologne, perfume, aftershave, or lotions (deodorant is allowed). Please arrive 15 minutes prior to your appointment time. [place], you and your health needs are our priority.  As part of our continuing mission to provide you with exceptional heart care, we have created designated Provider Care Teams.  These Care Teams include your primary Cardiologist (physician) and Advanced Practice Providers (APPs -  Physician Assistants and Nurse Practitioners) who all work together to provide you with the care you need, when you need it.  We recommend signing up for the patient portal called "MyChart".  Sign up information is provided on this After Visit Summary.  MyChart is used to connect with patients for Virtual Visits (Telemedicine).  Patients are able to view lab/test results, encounter notes, upcoming appointments, etc.  Non-urgent messages can be sent to your provider as well.   To learn more about what you can do with MyChart, go to [email or web address].    Your next appointment:   6 month(s)  Provider:   Kirk Ruths, MD

## 2022-06-27 ENCOUNTER — Encounter: Payer: Self-pay | Admitting: Cardiology

## 2022-06-28 MED ORDER — ASPIRIN 81 MG PO TBEC: 81.0000 mg | DELAYED_RELEASE_TABLET | Freq: Every day | ORAL | 3 refills | Status: AC

## 2022-07-05 ENCOUNTER — Ambulatory Visit (HOSPITAL_BASED_OUTPATIENT_CLINIC_OR_DEPARTMENT_OTHER)
Admission: RE | Admit: 2022-07-05 | Discharge: 2022-07-05 | Disposition: A | Discharge status: Home / Self Care | Payer: Medicare Other | Source: Ambulatory Visit | Attending: Cardiology | Admitting: Cardiology

## 2022-07-05 DIAGNOSIS — R0609 Other forms of dyspnea: Secondary | ICD-10-CM | POA: Diagnosis not present

## 2022-07-05 DIAGNOSIS — R072 Precordial pain: Secondary | ICD-10-CM | POA: Diagnosis not present

## 2022-07-05 DIAGNOSIS — I420 Dilated cardiomyopathy: Secondary | ICD-10-CM | POA: Insufficient documentation

## 2022-07-05 LAB — ECHOCARDIOGRAM COMPLETE
AR max vel: 2.22 cm2
AV Area VTI: 2.31 cm2
AV Area mean vel: 2.31 cm2
AV Mean grad: 2 mmHg
AV Peak grad: 5 mmHg
Ao pk vel: 1.12 m/s
Area-P 1/2: 1.81 cm2
Calc EF: 58.1 %
S' Lateral: 3.3 cm
Single Plane A2C EF: 56.1 %
Single Plane A4C EF: 54.8 %

## 2022-07-25 ENCOUNTER — Other Ambulatory Visit: Payer: Self-pay | Admitting: Cardiology

## 2022-08-13 ENCOUNTER — Telehealth: Payer: Self-pay | Admitting: Family Medicine

## 2022-08-13 NOTE — Telephone Encounter (Signed)
Contacted Donald Patel to schedule their annual wellness visit. Patient declined to schedule AWV at this time.  Lin Givens Patient Arts administrator II Direct Dial: [phone]

## 2022-08-20 DIAGNOSIS — Z961 Presence of intraocular lens: Secondary | ICD-10-CM | POA: Diagnosis not present

## 2022-08-20 DIAGNOSIS — H04123 Dry eye syndrome of bilateral lacrimal glands: Secondary | ICD-10-CM | POA: Diagnosis not present

## 2022-08-20 DIAGNOSIS — H526 Other disorders of refraction: Secondary | ICD-10-CM | POA: Diagnosis not present

## 2022-08-20 DIAGNOSIS — H35373 Puckering of macula, bilateral: Secondary | ICD-10-CM | POA: Diagnosis not present

## 2022-08-20 DIAGNOSIS — H353131 Nonexudative age-related macular degeneration, bilateral, early dry stage: Secondary | ICD-10-CM | POA: Diagnosis not present

## 2022-08-20 DIAGNOSIS — H26493 Other secondary cataract, bilateral: Secondary | ICD-10-CM | POA: Diagnosis not present

## 2022-08-29 ENCOUNTER — Ambulatory Visit (INDEPENDENT_AMBULATORY_CARE_PROVIDER_SITE_OTHER): Payer: Medicare Other

## 2022-08-29 DIAGNOSIS — I420 Dilated cardiomyopathy: Secondary | ICD-10-CM | POA: Diagnosis not present

## 2022-08-29 LAB — CUP PACEART REMOTE DEVICE CHECK
Battery Remaining Longevity: 11 mo
Battery Remaining Percentage: 10 %
Battery Voltage: 2.83 V
Brady Statistic AP VP Percent: 26 %
Brady Statistic AP VS Percent: 1 %
Brady Statistic AS VP Percent: 74 %
Brady Statistic AS VS Percent: 1 %
Brady Statistic RA Percent Paced: 26 %
Brady Statistic RV Percent Paced: 99 %
Date Time Interrogation Session: 20240321020233
Implantable Lead Connection Status: 753985
Implantable Lead Connection Status: 753985
Implantable Lead Implant Date: 20051123
Implantable Lead Implant Date: 20051123
Implantable Lead Location: 753859
Implantable Lead Location: 753860
Implantable Pulse Generator Implant Date: 20150219
Lead Channel Impedance Value: 460 Ohm
Lead Channel Impedance Value: 490 Ohm
Lead Channel Pacing Threshold Amplitude: 0.5 V
Lead Channel Pacing Threshold Amplitude: 1.625 V
Lead Channel Pacing Threshold Pulse Width: 0.5 ms
Lead Channel Pacing Threshold Pulse Width: 0.5 ms
Lead Channel Sensing Intrinsic Amplitude: 12 mV
Lead Channel Sensing Intrinsic Amplitude: 4.6 mV
Lead Channel Setting Pacing Amplitude: 1.875
Lead Channel Setting Pacing Amplitude: 2 V
Lead Channel Setting Pacing Pulse Width: 0.5 ms
Lead Channel Setting Sensing Sensitivity: 5 mV
Pulse Gen Model: 2240
Pulse Gen Serial Number: 7596156

## 2022-10-02 NOTE — Progress Notes (Signed)
Remote pacemaker transmission.   

## 2022-11-21 ENCOUNTER — Ambulatory Visit: Payer: Medicare Other | Admitting: Family Medicine

## 2022-11-28 ENCOUNTER — Ambulatory Visit (INDEPENDENT_AMBULATORY_CARE_PROVIDER_SITE_OTHER): Payer: Medicare Other

## 2022-11-28 DIAGNOSIS — I442 Atrioventricular block, complete: Secondary | ICD-10-CM | POA: Diagnosis not present

## 2022-11-28 LAB — CUP PACEART REMOTE DEVICE CHECK
Battery Remaining Longevity: 10 mo
Battery Remaining Percentage: 9 %
Battery Voltage: 2.81 V
Brady Statistic AP VP Percent: 29 %
Brady Statistic AP VS Percent: 1 %
Brady Statistic AS VP Percent: 71 %
Brady Statistic AS VS Percent: 1 %
Brady Statistic RA Percent Paced: 29 %
Brady Statistic RV Percent Paced: 99 %
Date Time Interrogation Session: 20240620020013
Implantable Lead Connection Status: 753985
Implantable Lead Connection Status: 753985
Implantable Lead Implant Date: 20051123
Implantable Lead Implant Date: 20051123
Implantable Lead Location: 753859
Implantable Lead Location: 753860
Implantable Pulse Generator Implant Date: 20150219
Lead Channel Impedance Value: 460 Ohm
Lead Channel Impedance Value: 480 Ohm
Lead Channel Pacing Threshold Amplitude: 0.5 V
Lead Channel Pacing Threshold Amplitude: 1.625 V
Lead Channel Pacing Threshold Pulse Width: 0.5 ms
Lead Channel Pacing Threshold Pulse Width: 0.5 ms
Lead Channel Sensing Intrinsic Amplitude: 12 mV
Lead Channel Sensing Intrinsic Amplitude: 4.6 mV
Lead Channel Setting Pacing Amplitude: 1.875
Lead Channel Setting Pacing Amplitude: 2 V
Lead Channel Setting Pacing Pulse Width: 0.5 ms
Lead Channel Setting Sensing Sensitivity: 5 mV
Pulse Gen Model: 2240
Pulse Gen Serial Number: 7596156

## 2022-12-02 ENCOUNTER — Encounter: Payer: Self-pay | Admitting: Family Medicine

## 2022-12-02 ENCOUNTER — Ambulatory Visit (INDEPENDENT_AMBULATORY_CARE_PROVIDER_SITE_OTHER): Payer: Medicare Other | Admitting: Family Medicine

## 2022-12-02 VITALS — BP 130/54 | HR 63 | Ht 69.0 in | Wt 145.0 lb

## 2022-12-02 DIAGNOSIS — I10 Essential (primary) hypertension: Secondary | ICD-10-CM | POA: Diagnosis not present

## 2022-12-02 DIAGNOSIS — K222 Esophageal obstruction: Secondary | ICD-10-CM

## 2022-12-02 DIAGNOSIS — J439 Emphysema, unspecified: Secondary | ICD-10-CM

## 2022-12-02 DIAGNOSIS — I700 Atherosclerosis of aorta: Secondary | ICD-10-CM | POA: Diagnosis not present

## 2022-12-02 DIAGNOSIS — R499 Unspecified voice and resonance disorder: Secondary | ICD-10-CM

## 2022-12-02 NOTE — Assessment & Plan Note (Signed)
No recent flares or exacerbations.  

## 2022-12-02 NOTE — Progress Notes (Signed)
Established Patient Office Visit  Subjective   Patient ID: Donald Patel, male    DOB: 1940-10-13  Age: 82 y.o. MRN: [id number]  Chief Complaint  Patient presents with   Hypertension     HPI  He has noticed some voice change.  He did call GI to get in with them as he does have a prior history of a Schatzki's ring.  He has not been able to get an appointment until September.  He says for now he will keep an eye on it.  Hypertension- Pt denies chest pain, SOB, dizziness, or heart palpitations.  Taking meds as directed w/o problems.  Denies medication side effects.  Blood pressures ranging from systolic of 120 up to 140.     ROS    Objective:     BP (!) 130/54   Pulse 63   Ht 5\' 9"  (1.753 m)   Wt 145 lb (65.8 kg)   SpO2 98%   BMI 21.41 kg/m    Physical Exam Constitutional:      Appearance: He is well-developed.  HENT:     Head: Normocephalic and atraumatic.  Cardiovascular:     Rate and Rhythm: Normal rate and regular rhythm.     Heart sounds: Normal heart sounds.  Pulmonary:     Effort: Pulmonary effort is normal.     Breath sounds: Normal breath sounds.  Skin:    General: Skin is warm and dry.  Neurological:     Mental Status: He is alert and oriented to person, place, and time.  Psychiatric:        Behavior: Behavior normal.      No results found for any visits on 12/02/22.    The ASCVD Risk score (Arnett DK, et al., 2019) failed to calculate for the following reasons:   The 2019 ASCVD risk score is only valid for ages 53 to 103    Assessment & Plan:   Problem List Items Addressed This Visit       Cardiovascular and Mediastinum   Essential hypertension, benign - Primary    Pressure at goal today.  Continue to monitor.      Relevant Orders   COMPLETE METABOLIC PANEL WITH GFR   Lipid Panel w/reflex Direct LDL   CBC   Aortic atherosclerosis (HCC)    Due to recheck lipid panel.  Not currently on a statin.  Followed by cardiology.       Relevant Orders   COMPLETE METABOLIC PANEL WITH GFR   Lipid Panel w/reflex Direct LDL   CBC     Respiratory   Emphysema lung (HCC)    No recent flares or exacerbations.      Relevant Orders   COMPLETE METABOLIC PANEL WITH GFR   Lipid Panel w/reflex Direct LDL   CBC     Digestive   Schatzki's ring    Scheduled appointment with GI in September.      Other Visit Diagnoses     Change in voice          Change in voice-we did discuss getting on a wait list for GI to get in little sooner.  Certainly if he notices any new or worsening symptoms we could call to try to get him in a little bit more urgently he has been noticing some swallowing issues as well and has prior history of Schatzki's ring.  We also discussed possible ENT referral for the voice change we could likely get him in sooner.  He  declined and says he will wait until he sees Dr. Barron Alvine. Recommend a trial of Prilosec or Nexium.  Take 1 about 30 minutes before breakfast each day for 2 weeks just to see if you notice a decrease in phlegm production, and maybe change in the sound of your voice.  If it is helpful then I would continue with the medication until you see Dr. Barron Alvine in September.  Declined all vaccines and  Medicare annual wellness exam.  Return in about 6 months (around 06/03/2023) for Hypertension.    Nani Gasser, MD

## 2022-12-02 NOTE — Assessment & Plan Note (Signed)
Pressure at goal today.  Continue to monitor. 

## 2022-12-02 NOTE — Assessment & Plan Note (Signed)
Due to recheck lipid panel.  Not currently on a statin.  Followed by cardiology.

## 2022-12-02 NOTE — Patient Instructions (Signed)
Recommend a trial of Prilosec or Nexium.  Take 1 about 30 minutes before breakfast each day for 2 weeks just to see if you notice a decrease in phlegm production, and maybe change in the sound of your voice.  If it is helpful then I would continue with the medication until you see Dr. Barron Alvine in September. You may want to even consider giving them a call and see if you could be placed on a wait list.

## 2022-12-02 NOTE — Assessment & Plan Note (Signed)
Scheduled appointment with GI in September.

## 2022-12-05 DIAGNOSIS — I10 Essential (primary) hypertension: Secondary | ICD-10-CM | POA: Diagnosis not present

## 2022-12-05 DIAGNOSIS — I700 Atherosclerosis of aorta: Secondary | ICD-10-CM | POA: Diagnosis not present

## 2022-12-05 DIAGNOSIS — J439 Emphysema, unspecified: Secondary | ICD-10-CM | POA: Diagnosis not present

## 2022-12-06 LAB — COMPLETE METABOLIC PANEL WITHOUT GFR
AG Ratio: 1.7 (calc) (ref 1.0–2.5)
ALT: 12 U/L (ref 9–46)
AST: 13 U/L (ref 10–35)
Albumin: 4.2 g/dL (ref 3.6–5.1)
Alkaline phosphatase (APISO): 73 U/L (ref 35–144)
BUN: 17 mg/dL (ref 7–25)
CO2: 31 mmol/L (ref 20–32)
Calcium: 9.9 mg/dL (ref 8.6–10.3)
Chloride: 96 mmol/L — ABNORMAL LOW (ref 98–110)
Creat: 1.02 mg/dL (ref 0.70–1.22)
Globulin: 2.5 g/dL (ref 1.9–3.7)
Glucose, Bld: 99 mg/dL (ref 65–99)
Potassium: 3.5 mmol/L (ref 3.5–5.3)
Sodium: 137 mmol/L (ref 135–146)
Total Bilirubin: 1.1 mg/dL (ref 0.2–1.2)
Total Protein: 6.7 g/dL (ref 6.1–8.1)
eGFR: 73 mL/min/1.73m2 (ref >=60)

## 2022-12-06 LAB — LIPID PANEL W/REFLEX DIRECT LDL
Cholesterol: 203 mg/dL — ABNORMAL HIGH (ref <200)
HDL: 71 mg/dL (ref >=40)
LDL Cholesterol (Calc): 113 mg/dL — ABNORMAL HIGH
Non-HDL Cholesterol (Calc): 132 mg/dL — ABNORMAL HIGH (ref <130)
Total CHOL/HDL Ratio: 2.9 (calc) (ref <5.0)
Triglycerides: 90 mg/dL (ref <150)

## 2022-12-06 LAB — CBC
HCT: 44.7 % (ref 38.5–50.0)
Hemoglobin: 15.3 g/dL (ref 13.2–17.1)
MCH: 31.9 pg (ref 27.0–33.0)
MCHC: 34.2 g/dL (ref 32.0–36.0)
MCV: 93.1 fL (ref 80.0–100.0)
MPV: 11.4 fL (ref 7.5–12.5)
Platelets: 185 Thousand/uL (ref 140–400)
RBC: 4.8 Million/uL (ref 4.20–5.80)
RDW: 12.3 % (ref 11.0–15.0)
WBC: 6.3 Thousand/uL (ref 3.8–10.8)

## 2022-12-06 NOTE — Progress Notes (Signed)
Verlan, the the metabolic panel overall looks good.  LDL cholesterol is still mildly elevated but is better than last year so great work in bringing that back down.  Blood count is normal.

## 2022-12-18 NOTE — Progress Notes (Signed)
Remote pacemaker transmission.   

## 2023-01-14 ENCOUNTER — Other Ambulatory Visit: Payer: Self-pay | Admitting: Cardiology

## 2023-01-15 ENCOUNTER — Telehealth: Payer: Self-pay | Admitting: *Deleted

## 2023-01-15 MED ORDER — METOPROLOL SUCCINATE ER 25 MG PO TB24: 25.0000 mg | ORAL_TABLET | Freq: Every day | ORAL | 3 refills | Status: DC

## 2023-01-15 NOTE — Telephone Encounter (Signed)
[facility] in, [place], [place] [phone] is requesting refill on Metoprolol ER succinate (Toprol -XL) 25 mg. Patient was taking 25 mg daily. Per Dr. Ludwig Clarks office vs with pt on 06/24/22 medication change to 12.5 mg daily. Please advise of correct dosing for patient. Thank you

## 2023-01-15 NOTE — Telephone Encounter (Signed)
Spoke with pt, he is taking 25 mg once daily. Refill sent to the pharmacy electronically.

## 2023-01-21 ENCOUNTER — Other Ambulatory Visit: Payer: Self-pay | Admitting: Cardiology

## 2023-01-21 DIAGNOSIS — I5042 Chronic combined systolic (congestive) and diastolic (congestive) heart failure: Secondary | ICD-10-CM

## 2023-02-27 ENCOUNTER — Ambulatory Visit (INDEPENDENT_AMBULATORY_CARE_PROVIDER_SITE_OTHER): Payer: Medicare Other

## 2023-02-27 DIAGNOSIS — I442 Atrioventricular block, complete: Secondary | ICD-10-CM

## 2023-02-27 LAB — CUP PACEART REMOTE DEVICE CHECK
Battery Remaining Longevity: 6 mo
Battery Remaining Percentage: 5 %
Battery Voltage: 2.75 V
Brady Statistic AP VP Percent: 33 %
Brady Statistic AP VS Percent: 1 %
Brady Statistic AS VP Percent: 67 %
Brady Statistic AS VS Percent: 1 %
Brady Statistic RA Percent Paced: 33 %
Brady Statistic RV Percent Paced: 99 %
Date Time Interrogation Session: 20240919020014
Implantable Lead Connection Status: 753985
Implantable Lead Connection Status: 753985
Implantable Lead Implant Date: 20051123
Implantable Lead Implant Date: 20051123
Implantable Lead Location: 753859
Implantable Lead Location: 753860
Implantable Pulse Generator Implant Date: 20150219
Lead Channel Impedance Value: 460 Ohm
Lead Channel Impedance Value: 490 Ohm
Lead Channel Pacing Threshold Amplitude: 0.5 V
Lead Channel Pacing Threshold Amplitude: 1.875 V
Lead Channel Pacing Threshold Pulse Width: 0.5 ms
Lead Channel Pacing Threshold Pulse Width: 0.5 ms
Lead Channel Sensing Intrinsic Amplitude: 12 mV
Lead Channel Sensing Intrinsic Amplitude: 4.1 mV
Lead Channel Setting Pacing Amplitude: 2 V
Lead Channel Setting Pacing Amplitude: 2.125
Lead Channel Setting Pacing Pulse Width: 0.5 ms
Lead Channel Setting Sensing Sensitivity: 5 mV
Pulse Gen Model: 2240
Pulse Gen Serial Number: 7596156

## 2023-02-28 ENCOUNTER — Ambulatory Visit: Payer: Medicare Other | Admitting: Gastroenterology

## 2023-03-11 NOTE — Progress Notes (Signed)
Remote pacemaker transmission.   

## 2023-03-13 NOTE — Progress Notes (Signed)
HPI: Follow-up chronic combined systolic/diastolic congestive heart failure, history of pacemaker, hypertension and paroxysmal atrial fibrillation. Echocardiogram June 2022 showed ejection fraction 40 to 45%, trace aortic insufficiency and mildly dilated aortic root at 40 mm.  Cardiac CTA August 2022 showed calcium score 0, minimal plaque in the first diagonal and distal myocardial bridge.  There was note of aortic atherosclerosis.  Echocardiogram repeated January 2024 and showed ejection fraction 50 to 55%, grade 1 diastolic dysfunction.  Since last seen he has dyspnea with more vigorous activities.  No orthopnea, PND, pedal edema or syncope.  He has continuous discomfort in his chest since his previous vaccine but no other exertional chest pain.  Current Outpatient Medications  Medication Sig Dispense Refill   aspirin EC 81 MG tablet Take 1 tablet (81 mg total) by mouth daily. Swallow whole. 90 tablet 3   Cholecalciferol (VITAMIN D3) 25 MCG/SPRAY LIQD Take by mouth.     hydrochlorothiazide (HYDRODIURIL) 25 MG tablet Take 1 tablet (25 mg total) by mouth daily. 90 tablet 3   ibuprofen (ADVIL) 200 MG tablet Take 200 mg by mouth as needed.     losartan (COZAAR) 25 MG tablet TAKE 1 TABLET(25 MG) BY MOUTH DAILY 90 tablet 1   metoprolol succinate (TOPROL-XL) 25 MG 24 hr tablet Take 1 tablet (25 mg total) by mouth daily. 90 tablet 3   Multiple Vitamins-Minerals (PRESERVISION AREDS 2 PO) Take by mouth as needed.     Psyllium (METAMUCIL) 28.3 % POWD Take by mouth daily as needed.     No current facility-administered medications for this visit.     Past Medical History:  Diagnosis Date   AV BLOCK, COMPLETE    s/p PPM ([facility])   Cataract both eyes corrected   Chronic right-sided low back pain with right-sided sciatica 11/09/2019   Emphysema lung (HCC) 11/09/2020   ERECTILE DYSFUNCTION, ORGANIC    History of colon polyps    Hypertension    Hypertension     Past Surgical History:  Procedure  Laterality Date   CATARACT EXTRACTION W/ INTRAOCULAR LENS IMPLANT  2013   right eye - [facility]   CATARACT EXTRACTION W/ INTRAOCULAR LENS IMPLANT  2015   Left eye - [facility]   COLONOSCOPY  11/29/2015   [facility]   KNEE SURGERY     PACEMAKER PLACEMENT  12/22/96   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   PERMANENT PACEMAKER GENERATOR CHANGE N/A 07/29/2013   Procedure: PERMANENT PACEMAKER GENERATOR CHANGE;  Surgeon: Gardiner Rhyme, MD;  Location: [facility] CATH [facility];  Service: Cardiovascular;  Laterality: N/A;   VASECTOMY      Social History   Socioeconomic History   Marital status: Married    Spouse name: Not on file   Number of children: Not on file   Years of education: Not on file   Highest education level: 12th grade  Occupational History   Occupation: Retired  Tobacco Use   Smoking status: Former    Current packs/day: 0.00    Average packs/day: 1.5 packs/day for 35.0 years (52.5 ttl pk-yrs)    Types: Cigarettes, Pipe, Cigars    Start date: 06/10/1950    Quit date: 06/10/1985    Years since quitting: 37.8   Smokeless tobacco: Never  Substance and Sexual Activity   Alcohol use: Yes    Alcohol/week: 14.0 standard drinks of alcohol    Types: 14 Cans of beer per week  Drug use: No   Sexual activity: Never  Other Topics Concern   Not on file  Social History Narrative   Recently retired,Former Chartered certified accountant.  Exercise.    Social Determinants of Health   Financial Resource Strain: Low Risk  (11/28/2022)   Overall Financial Resource Strain (CARDIA)    Difficulty of Paying Living Expenses: Not hard at all  Food Insecurity: No Food Insecurity (11/28/2022)   Hunger Vital Sign    Worried About Running Out of Food in the Last Year: Never true    Ran Out of Food in the Last Year: Never true  Transportation Needs: No Transportation Needs (11/28/2022)   PRAPARE - Administrator, Civil Service  (Medical): No    Lack of Transportation (Non-Medical): No  Physical Activity: Unknown (11/28/2022)   Exercise Vital Sign    Days of Exercise per Week: 0 days    Minutes of Exercise per Session: Not on file  Stress: No Stress Concern Present (11/28/2022)   [employer] of Occupational Health - Occupational Stress Questionnaire    Feeling of Stress : Not at all  Social Connections: Moderately Isolated (11/28/2022)   Social Connection and Isolation Panel [NHANES]    Frequency of Communication with Friends and Family: More than three times a week    Frequency of Social Gatherings with Friends and Family: Once a week    Attends Religious Services: Never    Database administrator or Organizations: No    Attends Engineer, structural: Not on file    Marital Status: Married  Catering manager Violence: Not on file    Family History  Problem Relation Age of Onset   Breast cancer Mother    Heart attack Brother    Diabetes Other    Hodgkin's lymphoma Brother    Colon cancer Neg Hx    Esophageal cancer Neg Hx    Rectal cancer Neg Hx    Stomach cancer Neg Hx     ROS: no fevers or chills, productive cough, hemoptysis, dysphasia, odynophagia, melena, hematochezia, dysuria, hematuria, rash, seizure activity, orthopnea, PND, pedal edema, claudication. Remaining systems are negative.  Physical Exam: Well-developed well-nourished in no acute distress.  Skin is warm and dry.  HEENT is normal.  Neck is supple.  Chest is clear to auscultation with normal expansion.  Cardiovascular exam is regular rate and rhythm.  Abdominal exam nontender or distended. No masses palpated. Extremities show no edema. neuro grossly intact   A/P  1 chronic combined systolic/diastolic congestive heart failure-most recent echocardiogram showed ejection fraction had improved to low normal.  He appears to be euvolemic.  Will continue diuretic at present dose.  Previously declined SGLT2 inhibitor.  2  history of nonischemic cardiomyopathy-LV function low normal on most recent echocardiogram.  Continue ARB and beta-blocker.  3 paroxysmal atrial fibrillation-continue beta-blocker for rate control if atrial fibrillation recurs.  Patient states that apixaban caused hematochezia as he has difficulties with diarrhea.  He therefore declines to continue and understands the small risk of CVA.  Continue aspirin.  4 hypertension-blood pressure elevated; however typically controlled.  Continue present medications and follow-up.  5 hyperlipidemia-managed by primary care.  6 pacemaker-device nearing ERI.  Will arrange follow-up with Dr. Elberta Fortis.  Olga Millers, MD

## 2023-03-26 ENCOUNTER — Encounter: Payer: Self-pay | Admitting: Cardiology

## 2023-03-26 ENCOUNTER — Ambulatory Visit: Payer: Medicare Other | Admitting: Cardiology

## 2023-03-26 VITALS — BP 162/80 | HR 60 | Ht 69.0 in | Wt 145.0 lb

## 2023-03-26 DIAGNOSIS — Z09 Encounter for follow-up examination after completed treatment for conditions other than malignant neoplasm: Secondary | ICD-10-CM

## 2023-03-26 DIAGNOSIS — I10 Essential (primary) hypertension: Secondary | ICD-10-CM

## 2023-03-26 DIAGNOSIS — I480 Paroxysmal atrial fibrillation: Secondary | ICD-10-CM

## 2023-03-26 DIAGNOSIS — I5042 Chronic combined systolic (congestive) and diastolic (congestive) heart failure: Secondary | ICD-10-CM | POA: Diagnosis not present

## 2023-03-26 DIAGNOSIS — E785 Hyperlipidemia, unspecified: Secondary | ICD-10-CM

## 2023-03-26 DIAGNOSIS — I420 Dilated cardiomyopathy: Secondary | ICD-10-CM

## 2023-03-26 NOTE — Patient Instructions (Signed)
    Follow-Up: At [facility], you and your health needs are our priority.  As part of our continuing mission to provide you with exceptional heart care, we have created designated Provider Care Teams.  These Care Teams include your primary Cardiologist (physician) and Advanced Practice Providers (APPs -  Physician Assistants and Nurse Practitioners) who all work together to provide you with the care you need, when you need it.  We recommend signing up for the patient portal called "MyChart".  Sign up information is provided on this After Visit Summary.  MyChart is used to connect with patients for Virtual Visits (Telemedicine).  Patients are able to view lab/test results, encounter notes, upcoming appointments, etc.  Non-urgent messages can be sent to your provider as well.   To learn more about what you can do with MyChart, go to [email or web address].    Your next appointment:   6 month(s)  Provider:   Kirk Ruths MD

## 2023-03-27 ENCOUNTER — Encounter: Payer: Self-pay | Admitting: Cardiology

## 2023-03-31 ENCOUNTER — Ambulatory Visit: Payer: Medicare Other

## 2023-03-31 DIAGNOSIS — I480 Paroxysmal atrial fibrillation: Secondary | ICD-10-CM

## 2023-03-31 DIAGNOSIS — I420 Dilated cardiomyopathy: Secondary | ICD-10-CM

## 2023-04-02 ENCOUNTER — Ambulatory Visit: Payer: Medicare Other | Admitting: Gastroenterology

## 2023-04-02 ENCOUNTER — Encounter: Payer: Self-pay | Admitting: Gastroenterology

## 2023-04-02 VITALS — BP 150/62 | HR 68 | Ht 70.0 in | Wt 144.0 lb

## 2023-04-02 DIAGNOSIS — R09A2 Foreign body sensation, throat: Secondary | ICD-10-CM

## 2023-04-02 DIAGNOSIS — R0989 Other specified symptoms and signs involving the circulatory and respiratory systems: Secondary | ICD-10-CM

## 2023-04-02 DIAGNOSIS — K222 Esophageal obstruction: Secondary | ICD-10-CM | POA: Diagnosis not present

## 2023-04-02 DIAGNOSIS — I442 Atrioventricular block, complete: Secondary | ICD-10-CM | POA: Diagnosis not present

## 2023-04-02 DIAGNOSIS — R1310 Dysphagia, unspecified: Secondary | ICD-10-CM | POA: Diagnosis not present

## 2023-04-02 LAB — CUP PACEART REMOTE DEVICE CHECK
Battery Remaining Longevity: 5 mo
Battery Remaining Percentage: 4 %
Battery Voltage: 2.74 V
Brady Statistic AP VP Percent: 33 %
Brady Statistic AP VS Percent: 1 %
Brady Statistic AS VP Percent: 67 %
Brady Statistic AS VS Percent: 1 %
Brady Statistic RA Percent Paced: 33 %
Brady Statistic RV Percent Paced: 99 %
Date Time Interrogation Session: 20241021020015
Implantable Lead Connection Status: 753985
Implantable Lead Connection Status: 753985
Implantable Lead Implant Date: 20051123
Implantable Lead Implant Date: 20051123
Implantable Lead Location: 753859
Implantable Lead Location: 753860
Implantable Pulse Generator Implant Date: 20150219
Lead Channel Impedance Value: 450 Ohm
Lead Channel Impedance Value: 450 Ohm
Lead Channel Pacing Threshold Amplitude: 0.5 V
Lead Channel Pacing Threshold Amplitude: 1.75 V
Lead Channel Pacing Threshold Pulse Width: 0.5 ms
Lead Channel Pacing Threshold Pulse Width: 0.5 ms
Lead Channel Sensing Intrinsic Amplitude: 12 mV
Lead Channel Sensing Intrinsic Amplitude: 3.6 mV
Lead Channel Setting Pacing Amplitude: 2 V
Lead Channel Setting Pacing Amplitude: 2 V
Lead Channel Setting Pacing Pulse Width: 0.5 ms
Lead Channel Setting Sensing Sensitivity: 5 mV
Pulse Gen Model: 2240
Pulse Gen Serial Number: 7596156

## 2023-04-02 NOTE — Progress Notes (Unsigned)
Chief Complaint:    Dysphagia, increased throat clearing, voice changes  GI History: Donald Patel is a 82 y.o. 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   Initially seen in the GI clinic on 09/08/2020 for evaluation of fecal seepage which started after hemorrhoid banding at outside facility in 2013.  No nocturnal leakage.  No urge incontinence. - Colonoscopy (09/2020): Tattoo with adjacent scar and subtle nodularity in proximal ascending colon (biopsied: Benign), 3 subcentimeter tubular adenomas, sigmoid diverticulosis, internal hemorrhoids with scars from prior banding.  Recommend repeat in 3 years for continued surveillance   - 02/13/2021: Evaluation in the GI clinic for dysphagia with pills, pointing to suprasternal notch.  Will occasionally regurgitate pill back out.  Weight stable. "Only occurs when pill is >200 mg". - 02/22/2021: Esophagram: piecemeal swallowing with laryngeal penetration to the cords and a prominent cricopharyngeus muscle.  Small type I hiatal hernia and a 1.9 cm nonobstructing Schatzki's ring.  Did have some apprehension about swallowing the barium tablet, but once he did, tablet passed without issue into the stomach.  Referred to speech pathology   Endoscopic History: -Colonoscopy (03/2012, [facility]): Diminutive cecal polyp (SSP), 1.5 cm ascending colon polyp resected piecemeal and tattooed (TA), diminutive transverse polyp (TA), medium-sized internal/external hemorrhoids, sigmoid diverticulosis.  Recommend repeat in 6 months -Hemorrhoid banding at 3 separate sessions in 05/2012 -Colonoscopy (07/2012, Dr. Caryl Never  at [facility]): 7-8 mm polyp at hepatic flexure, cecal polyp, prior polypectomy site in ascending colon biopsy, Scar from prior hemorrhoid banding and distal rectal, sigmoid diverticulosis.   No path review.  Repeat in 3 years -Colonoscopy (11/2015, Dr. Caryl Never at [facility]): 5 mm tubular adenoma in ascending colon, no residual polyp at prior descending colon polypectomy site, pandiverticulosis, external hemorrhoids.  Scar from prior hemorrhoid banding in distal rectum.  Recommended repeat in 5 years - Colonoscopy (09/2020): Tattoo with adjacent scar and subtle nodularity in proximal ascending colon (biopsied: Benign), 3 subcentimeter tubular adenomas, sigmoid diverticulosis, internal hemorrhoids with scars from prior banding.  Recommend repeat in 3-5 years for continued surveillance  HPI:     Patient is a 82 y.o. male presenting to the Gastroenterology Clinic for evaluation of dysphagia, increased throat clearing, and changes in voice.  Was last seen by me in 2022 for pill dysphagia as outlined above.  Esophagram with prominent cricopharyngeus muscle, small type I HH, and nonobstructing Schatzki's ring and was referred to speech pathology (never went).   Since then, he increased water consumption with pills and sxs improved.   Now, main issue is increased phlegm and increased throat clearing. Has episodic raspy voice for the last 6 months or so. Episodic pill dysphagia now. PCM had recommended starting PPI for diagnostic/therapeutic intent, but he prefers to avoid medications unless absultely necessary. Weight stable.         Latest Ref Rng & Units 12/05/2022    8:29 AM 11/09/2020   12:00 AM 01/29/2018   11:42 AM  CBC  WBC 3.8 - 10.8 Thousand/uL 6.3  6.2  6.9   Hemoglobin 13.2 - 17.1 g/dL 24.4  01.0  27.2   Hematocrit 38.5 - 50.0 % 44.7  47.6  45.1   Platelets 140 - 400 Thousand/uL 185  198  216       Latest Ref Rng & Units 12/05/2022    8:29 AM 05/21/2022  11:28 AM 11/15/2021   12:00 AM  CMP  Glucose 65 - 99 mg/dL 99  91  99   BUN 7 - 25 mg/dL 17  15  17    Creatinine 0.70 - 1.22 mg/dL 7.82  9.56  2.13   Sodium 135 - 146 mmol/L 137  139  141   Potassium 3.5  - 5.3 mmol/L 3.5  4.1  4.6   Chloride 98 - 110 mmol/L 96  100  100   CO2 20 - 32 mmol/L 31  30  27    Calcium 8.6 - 10.3 mg/dL 9.9  9.6  9.7   Total Protein 6.1 - 8.1 g/dL 6.7   7.3   Total Bilirubin 0.2 - 1.2 mg/dL 1.1   0.8   AST 10 - 35 U/L 13   22   ALT 9 - 46 U/L 12   19       Review of systems:     No chest pain, no SOB, no fevers, no urinary sx   Past Medical History:  Diagnosis Date   AV BLOCK, COMPLETE    s/p PPM ([facility]JM)   Cataract both eyes corrected   Chronic right-sided low back pain with right-sided sciatica 11/09/2019   Emphysema lung (HCC) 11/09/2020   ERECTILE DYSFUNCTION, ORGANIC    History of colon polyps    Hypertension    Hypertension     Patient's surgical history, family medical history, social history, medications and allergies were all reviewed in Epic    Current Outpatient Medications  Medication Sig Dispense Refill   aspirin EC 81 MG tablet Take 1 tablet (81 mg total) by mouth daily. Swallow whole. 90 tablet 3   Cholecalciferol (VITAMIN D3) 25 MCG/SPRAY LIQD Take by mouth.     hydrochlorothiazide (HYDRODIURIL) 25 MG tablet Take 1 tablet (25 mg total) by mouth daily. 90 tablet 3   ibuprofen (ADVIL) 200 MG tablet Take 200 mg by mouth as needed.     losartan (COZAAR) 25 MG tablet TAKE 1 TABLET(25 MG) BY MOUTH DAILY 90 tablet 1   metoprolol succinate (TOPROL-XL) 25 MG 24 hr tablet Take 1 tablet (25 mg total) by mouth daily. 90 tablet 3   Multiple Vitamins-Minerals (PRESERVISION AREDS 2 PO) Take by mouth as needed.     Psyllium (METAMUCIL) 28.3 % POWD Take by mouth daily as needed.     No current facility-administered medications for this visit.    Physical Exam:     BP (!) 150/62   Pulse 68   Ht 5\' 10"  (1.778 m)   Wt 144 lb (65.3 kg)   BMI 20.66 kg/m   GENERAL:  Pleasant *** in NAD PSYCH: : Cooperative, normal affect EENT:  conjunctiva pink, mucous membranes moist, neck supple without masses CARDIAC:  RRR, ***murmur heard, no peripheral  edema PULM: Normal respiratory effort, lungs CTA bilaterally, no wheezing ABDOMEN:  Nondistended, soft, nontender. No obvious masses, no hepatomegaly,  normal bowel sounds SKIN:  turgor, no lesions seen Musculoskeletal:  Normal muscle tone, normal strength NEURO: Alert and oriented x 3, no focal neurologic deficits   IMPRESSION and PLAN:    1)   Will call after pacemaker info re EGD now or later No new meds Dysphagia precautions          Verlin Dike Tyson Parkison ,DO, FACG 04/02/2023, 1:48 PM

## 2023-04-02 NOTE — Patient Instructions (Addendum)
_______________________________________________________  If your blood pressure at your visit was 140/90 or greater, please contact your primary care physician to follow up on this.  _______________________________________________________  If you are age 83 or older, your body mass index should be between 23-30. Your Body mass index is 20.66 kg/m. If this is out of the aforementioned range listed, please consider follow up with your Primary Care Provider. ________________________________________________________  The  GI providers would like to encourage you to use [facility] to communicate with providers for non-urgent requests or questions.  Due to long hold times on the telephone, sending your provider a message by [facility] may be a faster and more efficient way to get a response.  Please allow 48 business hours for a response.  Please remember that this is for non-urgent requests.  _______________________________________________________  Please contact our office by MyChart or by calling [phone] after your cardiology group decides when you will have your pacemaker changed out.  It was a pleasure to see you today!  Vito Cirigliano, D.O.

## 2023-04-03 ENCOUNTER — Telehealth: Payer: Self-pay | Admitting: Gastroenterology

## 2023-04-03 DIAGNOSIS — R1310 Dysphagia, unspecified: Secondary | ICD-10-CM

## 2023-04-03 DIAGNOSIS — K222 Esophageal obstruction: Secondary | ICD-10-CM

## 2023-04-03 DIAGNOSIS — R0989 Other specified symptoms and signs involving the circulatory and respiratory systems: Secondary | ICD-10-CM

## 2023-04-03 DIAGNOSIS — R09A2 Foreign body sensation, throat: Secondary | ICD-10-CM

## 2023-04-03 NOTE — Telephone Encounter (Signed)
Dr. Bryan Lemma, please advise.

## 2023-04-03 NOTE — Telephone Encounter (Signed)
Inbound call from patient wanting to inform Dr. Barron Alvine of results of cardiology appointment. States he has 5 months left of pacemaker battery and would like to continue with endoscopy. Patient requesting a call back. Please advise, thank you.

## 2023-04-03 NOTE — Telephone Encounter (Signed)
I have spoken to patient who has scheduled endoscopy with Dr Barron Alvine on 04/11/23. Verbal instructions regarding time/date/location/prep for procedure have been given and written instructions have been available for additional review by patient in mychart. He verbalizes understanding of this information.

## 2023-04-11 ENCOUNTER — Ambulatory Visit (AMBULATORY_SURGERY_CENTER): Payer: Medicare Other | Admitting: Gastroenterology

## 2023-04-11 ENCOUNTER — Encounter: Payer: Self-pay | Admitting: Gastroenterology

## 2023-04-11 VITALS — BP 136/73 | HR 60 | Temp 97.3°F | Resp 23 | Ht 70.0 in | Wt 144.0 lb

## 2023-04-11 DIAGNOSIS — R0989 Other specified symptoms and signs involving the circulatory and respiratory systems: Secondary | ICD-10-CM

## 2023-04-11 DIAGNOSIS — R1310 Dysphagia, unspecified: Secondary | ICD-10-CM | POA: Diagnosis not present

## 2023-04-11 DIAGNOSIS — R09A2 Foreign body sensation, throat: Secondary | ICD-10-CM | POA: Diagnosis not present

## 2023-04-11 MED ORDER — SODIUM CHLORIDE 0.9 % IV SOLN: 500.0000 mL | INTRAVENOUS | Status: DC

## 2023-04-11 NOTE — Op Note (Signed)
[facility] Patient Name: Donald Patel Procedure Date: 04/11/2023 1:10 PM MRN: [id number] Endoscopist: Doristine Locks , MD, [id number] Age: 82 Referring MD:  Date of Birth: 1940-11-29 Gender: Male Account #: [id number] Procedure:                Upper GI endoscopy Indications:              Dysphagia, Abnormal cine-esophagram, Globus                            sensation, Increased throat clearing Medicines:                Monitored Anesthesia Care Procedure:                Pre-Anesthesia Assessment:                           - Prior to the procedure, a History and Physical                            was performed, and patient medications and                            allergies were reviewed. The patient's tolerance of                            previous anesthesia was also reviewed. The risks                            and benefits of the procedure and the sedation                            options and risks were discussed with the patient.                            All questions were answered, and informed consent                            was obtained. Prior Anticoagulants: The patient has                            taken no anticoagulant or antiplatelet agents. ASA                            Grade Assessment: III - A patient with severe                            systemic disease. After reviewing the risks and                            benefits, the patient was deemed in satisfactory                            condition to undergo the procedure.  After obtaining informed consent, the endoscope was                            passed under direct vision. Throughout the                            procedure, the patient's blood pressure, pulse, and                            oxygen saturations were monitored continuously. The                            Olympus Scope [id number] was introduced through the                            mouth, and advanced  to the cricopharyngeal                            esophagus. The upper GI endoscopy was technically                            difficult and complex due to abnormal anatomy. The                            patient tolerated the procedure well. Scope In: Scope Out: Findings:                 The oropharynx was normal.                           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 This stenosis measured 3 mm (inner                            diameter). The stenosis was not traversed.                            Difficult with visualization, but possibly a                            diverticulum co-located in this area? Complications:            No immediate complications. Estimated Blood Loss:     Estimated blood loss: none. Impression:               - Normal oropharynx.                           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 This was not traversable.                           -  No specimens collected. Recommendation:           - Patient has a contact number available for                            emergencies. The signs and symptoms of potential                            delayed complications were discussed with the                            patient. Return to normal activities tomorrow.                            Written discharge instructions were provided to the                            patient.                           - Continue present medications.                           - Perform a modified barium swallow at appointment                            to be scheduled. Depending on results, may also                            consider repeat esophagram.                           - Repeat upper endoscopy at [facility]                            with ultraslim scope and fluoroscopy available at                            the next available appointment. Doristine Locks, MD 04/11/2023 1:39:42 PM

## 2023-04-11 NOTE — Progress Notes (Signed)
Called to room to assist during endoscopic procedure.  Patient ID and intended procedure confirmed with present staff. Received instructions for my participation in the procedure from the performing physician.  

## 2023-04-11 NOTE — Progress Notes (Signed)
GASTROENTEROLOGY PROCEDURE H&P NOTE   Primary Care Physician: Agapito Games, MD    Reason for Procedure:  Dysphagia, increased throat clearing, raspy voice, globus sensation  Plan:    EGD with possible dilation and/or biopsies  Patient is appropriate for endoscopic procedure(s) in the ambulatory ([facility]) setting.  The nature of the procedure, as well as the risks, benefits, and alternatives were carefully and thoroughly reviewed with the patient. Ample time for discussion and questions allowed. The patient understood, was satisfied, and agreed to proceed.     HPI: Donald Patel is a 82 y.o. male who presents for EGD for evaluation of pill dysphagia, increased throat clearing, raspy voice, globus sensation.  Previous esophagram in 02/2021 with prominent cricopharyngeus muscle, small hiatal hernia, 1.9 cm Schatzki's ring..  Patient was most recently seen in the Gastroenterology Clinic on 04/02/2023 by me.  No interval change in medical history since that appointment. Please refer to that note for full details regarding GI history and clinical presentation.   Past Medical History:  Diagnosis Date   AV BLOCK, COMPLETE    s/p PPM ([facility])   Cataract both eyes corrected   Chronic right-sided low back pain with right-sided sciatica 11/09/2019   Emphysema lung (HCC) 11/09/2020   ERECTILE DYSFUNCTION, ORGANIC    History of colon polyps    Hypertension    Hypertension     Past Surgical History:  Procedure Laterality Date   CATARACT EXTRACTION W/ INTRAOCULAR LENS IMPLANT  2013   right eye - [facility]   CATARACT EXTRACTION W/ INTRAOCULAR LENS IMPLANT  2015   Left eye - [facility]   COLONOSCOPY  11/29/2015   [facility]   KNEE SURGERY     PACEMAKER PLACEMENT  12/22/96   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, most recent generator ([facility]) 05/02/04 both implanted by Dr Harl Bowie [facility]   PERMANENT PACEMAKER GENERATOR CHANGE N/A 07/29/2013   Procedure: PERMANENT PACEMAKER  GENERATOR CHANGE;  Surgeon: Gardiner Rhyme, MD;  Location: [facility] CATH LAB;  Service: Cardiovascular;  Laterality: N/A;   VASECTOMY      Prior to Admission medications   Medication Sig Start Date End Date Taking? Authorizing Provider  aspirin EC 81 MG tablet Take 1 tablet (81 mg total) by mouth daily. Swallow whole. 06/28/22   Lewayne Bunting, MD  Cholecalciferol (VITAMIN D3) 25 MCG/SPRAY LIQD Take by mouth.    [provider]  hydrochlorothiazide (HYDRODIURIL) 25 MG tablet Take 1 tablet (25 mg total) by mouth daily. 06/24/22   Lewayne Bunting, MD  ibuprofen (ADVIL) 200 MG tablet Take 200 mg by mouth as needed.    [provider]  losartan (COZAAR) 25 MG tablet TAKE 1 TABLET(25 MG) BY MOUTH DAILY 01/21/23   Lewayne Bunting, MD  metoprolol succinate (TOPROL-XL) 25 MG 24 hr tablet Take 1 tablet (25 mg total) by mouth daily. 01/15/23   Lewayne Bunting, MD  Multiple Vitamins-Minerals (PRESERVISION AREDS 2 PO) Take by mouth as needed.    [provider]  Psyllium (METAMUCIL) 28.3 % POWD Take by mouth daily as needed.    [provider]    Current Outpatient Medications  Medication Sig Dispense Refill   aspirin EC 81 MG tablet Take 1 tablet (81 mg total) by mouth daily. Swallow whole. 90 tablet 3   Cholecalciferol (VITAMIN D3) 25 MCG/SPRAY LIQD Take by mouth.     hydrochlorothiazide (HYDRODIURIL) 25 MG tablet Take 1 tablet (25 mg total) by mouth daily. 90  tablet 3   ibuprofen (ADVIL) 200 MG tablet Take 200 mg by mouth as needed.     losartan (COZAAR) 25 MG tablet TAKE 1 TABLET(25 MG) BY MOUTH DAILY 90 tablet 1   metoprolol succinate (TOPROL-XL) 25 MG 24 hr tablet Take 1 tablet (25 mg total) by mouth daily. 90 tablet 3   Multiple Vitamins-Minerals (PRESERVISION AREDS 2 PO) Take by mouth as needed.     Psyllium (METAMUCIL) 28.3 % POWD Take by mouth daily as needed.     No current facility-administered medications for this visit.    Allergies as of 04/11/2023 -  Review Complete 04/02/2023  Allergen Reaction Noted   Anoro ellipta [umeclidinium-vilanterol] Other (See Comments) 11/13/2021   Lisinopril Other (See Comments) 04/14/2019    Family History  Problem Relation Age of Onset   Breast cancer Mother    Heart attack Brother    Diabetes Other    Hodgkin's lymphoma Brother    Colon cancer Neg Hx    Esophageal cancer Neg Hx    Rectal cancer Neg Hx    Stomach cancer Neg Hx     Social History   Socioeconomic History   Marital status: Married    Spouse name: Not on file   Number of children: Not on file   Years of education: Not on file   Highest education level: 12th grade  Occupational History   Occupation: Retired  Tobacco Use   Smoking status: Former    Current packs/day: 0.00    Average packs/day: 1.5 packs/day for 35.0 years (52.5 ttl pk-yrs)    Types: Cigarettes, Pipe, Cigars    Start date: 06/10/1950    Quit date: 06/10/1985    Years since quitting: 37.8   Smokeless tobacco: Never  Substance and Sexual Activity   Alcohol use: Yes    Alcohol/week: 14.0 standard drinks of alcohol    Types: 14 Cans of beer per week   Drug use: No   Sexual activity: Never  Other Topics Concern   Not on file  Social History Narrative   Recently retired,Former Chartered certified accountant.  Exercise.    Social Determinants of Health   Financial Resource Strain: Low Risk  (11/28/2022)   Overall Financial Resource Strain (CARDIA)    Difficulty of Paying Living Expenses: Not hard at all  Food Insecurity: No Food Insecurity (11/28/2022)   Hunger Vital Sign    Worried About Running Out of Food in the Last Year: Never true    Ran Out of Food in the Last Year: Never true  Transportation Needs: No Transportation Needs (11/28/2022)   PRAPARE - Administrator, Civil Service (Medical): No    Lack of Transportation (Non-Medical): No  Physical Activity: Unknown (11/28/2022)   Exercise Vital Sign    Days of Exercise per Week: 0 days    Minutes of Exercise per  Session: Not on file  Stress: No Stress Concern Present (11/28/2022)   [employer] of Occupational Health - Occupational Stress Questionnaire    Feeling of Stress : Not at all  Social Connections: Moderately Isolated (11/28/2022)   Social Connection and Isolation Panel [NHANES]    Frequency of Communication with Friends and Family: More than three times a week    Frequency of Social Gatherings with Friends and Family: Once a week    Attends Religious Services: Never    Database administrator or Organizations: No    Attends Engineer, structural: Not on file    Marital Status: Married  Intimate Partner Violence: Not on file    Physical Exam: Vital signs in last 24 hours: @There  were no vitals taken for this visit. GEN: NAD EYE: Sclerae anicteric ENT: MMM CV: Non-tachycardic Pulm: CTA b/l GI: Soft, NT/ND NEURO:  Alert & Oriented x 3   Doristine Locks, DO [facility]   04/11/2023 12:38 PM

## 2023-04-11 NOTE — Progress Notes (Signed)
Vss nad trans to pacu 

## 2023-04-11 NOTE — Patient Instructions (Addendum)
Resume all of your previous medications today as ordered.  Read the discharge instructions given to you by your recovery room nurse. The office will schedule your barium swallow.  You will need a repeat Endoscopy at [facility].  The office will call you to schedule that as well.  YOU HAD AN ENDOSCOPIC PROCEDURE TODAY AT THE  ENDOSCOPY CENTER:   Refer to the procedure report that was given to you for any specific questions about what was found during the examination.  If the procedure report does not answer your questions, please call your gastroenterologist to clarify.  If you requested that your care partner not be given the details of your procedure findings, then the procedure report has been included in a sealed envelope for you to review at your convenience later.  YOU SHOULD EXPECT: Some feelings of bloating in the abdomen. Passage of more gas than usual.   Please Note:  You might notice some irritation and congestion in your nose or some drainage.  This is from the oxygen used during your procedure.  There is no need for concern and it should clear up in a day or so.  SYMPTOMS TO REPORT IMMEDIATELY:  Following upper endoscopy (EGD)  Vomiting of blood or coffee ground material  New chest pain or pain under the shoulder blades  Painful or persistently difficult swallowing  New shortness of breath  Fever of 100F or higher  Black, tarry-looking stools  For urgent or emergent issues, a gastroenterologist can be reached at any hour by calling [phone]. Do not use MyChart messaging for urgent concerns.    DIET:  We do recommend a small meal at first, but then you may proceed to your regular diet.  Drink plenty of fluids but you should avoid alcoholic beverages for 24 hours.  ACTIVITY:  You should plan to take it easy for the rest of today and you should NOT DRIVE or use heavy machinery until tomorrow (because of the sedation medicines used during the test).    FOLLOW UP: Our  staff will call the number listed on your records the next business day following your procedure.  We will call around 7:15- 8:00 am to check on you and address any questions or concerns that you may have regarding the information given to you following your procedure. If we do not reach you, we will leave a message.      SIGNATURES/CONFIDENTIALITY: You and/or your care partner have signed paperwork which will be entered into your electronic medical record.  These signatures attest to the fact that that the information above on your After Visit Summary has been reviewed and is understood.  Full responsibility of the confidentiality of this discharge information lies with you and/or your care-partner.

## 2023-04-14 ENCOUNTER — Other Ambulatory Visit (HOSPITAL_COMMUNITY): Payer: Self-pay | Admitting: Family Medicine

## 2023-04-14 ENCOUNTER — Other Ambulatory Visit: Payer: Self-pay

## 2023-04-14 ENCOUNTER — Other Ambulatory Visit (HOSPITAL_COMMUNITY): Payer: Self-pay | Admitting: Gastroenterology

## 2023-04-14 ENCOUNTER — Telehealth: Payer: Self-pay | Admitting: *Deleted

## 2023-04-14 DIAGNOSIS — R0989 Other specified symptoms and signs involving the circulatory and respiratory systems: Secondary | ICD-10-CM

## 2023-04-14 DIAGNOSIS — R1310 Dysphagia, unspecified: Secondary | ICD-10-CM

## 2023-04-14 DIAGNOSIS — R09A2 Foreign body sensation, throat: Secondary | ICD-10-CM

## 2023-04-14 NOTE — Telephone Encounter (Signed)
  Follow up Call-     04/11/2023   12:49 PM 10/03/2020   10:19 AM  Call back number  Post procedure Call Back phone  # [phone]  Permission to leave phone message Yes Yes     Patient questions:  Do you have a fever, pain , or abdominal swelling? No. Pain Score  0 *  Have you tolerated food without any problems? Yes.    Have you been able to return to your normal activities? Yes.    Do you have any questions about your discharge instructions: Diet   No. Medications  No. Follow up visit  No.  Do you have questions or concerns about your Care? No.  Actions: * If pain score is 4 or above: No action needed, pain <4.

## 2023-04-16 NOTE — Progress Notes (Signed)
Remote pacemaker transmission.   

## 2023-04-16 NOTE — Addendum Note (Signed)
Addended by: Geralyn Flash D on: 04/16/2023 04:59 PM   Modules accepted: Level of Service

## 2023-04-28 ENCOUNTER — Telehealth: Payer: Self-pay

## 2023-04-28 NOTE — Telephone Encounter (Signed)
Called patient. He has a remote check on Thursday. No current plans for battery change. He has an appointment on Wednesday for Modified Barium Swallow.  We will wait to see results of that before deciding next steps.

## 2023-04-28 NOTE — Telephone Encounter (Signed)
-----   Message from [facility] R sent at 04/02/2023  2:24 PM EDT ----- Regarding: Pacemaker Change out Has patient had pacemaker changeout?  If not planned he should have egd.  If planned we will wait for egd.Marland KitchenMarland KitchenSee VC's note from 04-02-23

## 2023-04-30 ENCOUNTER — Ambulatory Visit (HOSPITAL_COMMUNITY)
Admission: RE | Admit: 2023-04-30 | Discharge: 2023-04-30 | Disposition: A | Discharge status: Home / Self Care | Payer: Medicare Other | Source: Ambulatory Visit | Attending: Family Medicine

## 2023-04-30 ENCOUNTER — Ambulatory Visit (HOSPITAL_COMMUNITY)
Admission: RE | Admit: 2023-04-30 | Discharge: 2023-04-30 | Disposition: A | Discharge status: Home / Self Care | Payer: Medicare Other | Source: Ambulatory Visit | Attending: Family Medicine | Admitting: Family Medicine

## 2023-04-30 ENCOUNTER — Encounter (HOSPITAL_COMMUNITY): Payer: Self-pay

## 2023-04-30 ENCOUNTER — Other Ambulatory Visit (HOSPITAL_COMMUNITY): Payer: Self-pay | Admitting: Gastroenterology

## 2023-04-30 ENCOUNTER — Ambulatory Visit (HOSPITAL_COMMUNITY): Payer: Medicare Other

## 2023-04-30 DIAGNOSIS — R1310 Dysphagia, unspecified: Secondary | ICD-10-CM | POA: Insufficient documentation

## 2023-04-30 DIAGNOSIS — Z87891 Personal history of nicotine dependence: Secondary | ICD-10-CM | POA: Diagnosis not present

## 2023-04-30 DIAGNOSIS — R490 Dysphonia: Secondary | ICD-10-CM | POA: Diagnosis present

## 2023-04-30 DIAGNOSIS — K449 Diaphragmatic hernia without obstruction or gangrene: Secondary | ICD-10-CM | POA: Insufficient documentation

## 2023-04-30 DIAGNOSIS — K222 Esophageal obstruction: Secondary | ICD-10-CM | POA: Diagnosis not present

## 2023-04-30 DIAGNOSIS — R0989 Other specified symptoms and signs involving the circulatory and respiratory systems: Secondary | ICD-10-CM

## 2023-04-30 DIAGNOSIS — R059 Cough, unspecified: Secondary | ICD-10-CM

## 2023-04-30 DIAGNOSIS — R09A2 Foreign body sensation, throat: Secondary | ICD-10-CM

## 2023-04-30 DIAGNOSIS — R638 Other symptoms and signs concerning food and fluid intake: Secondary | ICD-10-CM | POA: Diagnosis not present

## 2023-05-01 ENCOUNTER — Ambulatory Visit (INDEPENDENT_AMBULATORY_CARE_PROVIDER_SITE_OTHER): Payer: Medicare Other

## 2023-05-01 DIAGNOSIS — I420 Dilated cardiomyopathy: Secondary | ICD-10-CM | POA: Diagnosis not present

## 2023-05-01 LAB — CUP PACEART REMOTE DEVICE CHECK
Battery Remaining Longevity: 4 mo
Battery Remaining Percentage: 3 %
Battery Voltage: 2.69 V
Brady Statistic AP VP Percent: 34 %
Brady Statistic AP VS Percent: 1 %
Brady Statistic AS VP Percent: 66 %
Brady Statistic AS VS Percent: 1 %
Brady Statistic RA Percent Paced: 34 %
Brady Statistic RV Percent Paced: 99 %
Date Time Interrogation Session: 20241121065244
Implantable Lead Connection Status: 753985
Implantable Lead Connection Status: 753985
Implantable Lead Implant Date: 20051123
Implantable Lead Implant Date: 20051123
Implantable Lead Location: 753859
Implantable Lead Location: 753860
Implantable Pulse Generator Implant Date: 20150219
Lead Channel Impedance Value: 480 Ohm
Lead Channel Impedance Value: 490 Ohm
Lead Channel Pacing Threshold Amplitude: 0.5 V
Lead Channel Pacing Threshold Amplitude: 1.875 V
Lead Channel Pacing Threshold Pulse Width: 0.5 ms
Lead Channel Pacing Threshold Pulse Width: 0.5 ms
Lead Channel Sensing Intrinsic Amplitude: 12 mV
Lead Channel Sensing Intrinsic Amplitude: 4.3 mV
Lead Channel Setting Pacing Amplitude: 2 V
Lead Channel Setting Pacing Amplitude: 2.125
Lead Channel Setting Pacing Pulse Width: 0.5 ms
Lead Channel Setting Sensing Sensitivity: 5 mV
Pulse Gen Model: 2240
Pulse Gen Serial Number: 7596156

## 2023-05-20 ENCOUNTER — Encounter: Payer: Self-pay | Admitting: Family Medicine

## 2023-05-20 DIAGNOSIS — J392 Other diseases of pharynx: Secondary | ICD-10-CM

## 2023-05-27 NOTE — Progress Notes (Signed)
Remote pacemaker transmission.   

## 2023-06-02 ENCOUNTER — Ambulatory Visit: Payer: Medicare Other

## 2023-06-02 DIAGNOSIS — I442 Atrioventricular block, complete: Secondary | ICD-10-CM

## 2023-06-02 LAB — CUP PACEART REMOTE DEVICE CHECK
Battery Remaining Longevity: 1 mo
Battery Remaining Percentage: 2 %
Battery Voltage: 2.66 V
Brady Statistic AP VP Percent: 35 %
Brady Statistic AP VS Percent: 1 %
Brady Statistic AS VP Percent: 65 %
Brady Statistic AS VS Percent: 1 %
Brady Statistic RA Percent Paced: 34 %
Brady Statistic RV Percent Paced: 99 %
Date Time Interrogation Session: 20241223020017
Implantable Lead Connection Status: 753985
Implantable Lead Connection Status: 753985
Implantable Lead Implant Date: 20051123
Implantable Lead Implant Date: 20051123
Implantable Lead Location: 753859
Implantable Lead Location: 753860
Implantable Pulse Generator Implant Date: 20150219
Lead Channel Impedance Value: 450 Ohm
Lead Channel Impedance Value: 480 Ohm
Lead Channel Pacing Threshold Amplitude: 0.5 V
Lead Channel Pacing Threshold Amplitude: 1.875 V
Lead Channel Pacing Threshold Pulse Width: 0.5 ms
Lead Channel Pacing Threshold Pulse Width: 0.5 ms
Lead Channel Sensing Intrinsic Amplitude: 12 mV
Lead Channel Sensing Intrinsic Amplitude: 4.3 mV
Lead Channel Setting Pacing Amplitude: 2 V
Lead Channel Setting Pacing Amplitude: 2.125
Lead Channel Setting Pacing Pulse Width: 0.5 ms
Lead Channel Setting Sensing Sensitivity: 5 mV
Pulse Gen Model: 2240
Pulse Gen Serial Number: 7596156

## 2023-06-03 ENCOUNTER — Encounter: Payer: Self-pay | Admitting: Cardiology

## 2023-06-04 IMAGING — RF DG ESOPHAGUS
8 series · 24 of 24 positions shown · non-contrast
Comparison: Chest CT 11/10/2020

CLINICAL DATA: Dysphagia, sensation of pills getting stuck in the
throat.

EXAM:
ESOPHOGRAM / BARIUM SWALLOW / BARIUM TABLET STUDY
TECHNIQUE: Combined double contrast and single contrast examination performed
using effervescent crystals, thick barium liquid, and thin barium
liquid. The patient was observed with fluoroscopy swallowing a 13 mm
barium sulphate tablet.
FLUOROSCOPY TIME:  Fluoroscopy Time:  2 minutes, 48 seconds
Radiation Exposure Index (if provided by the fluoroscopic device):
15.5 mGy
Number of Acquired Spot Images: 0

[Series 1: cp_standard · 0.34mm/px · 3 of 88 frames shown (1 of 8)]
[frame 8/88]
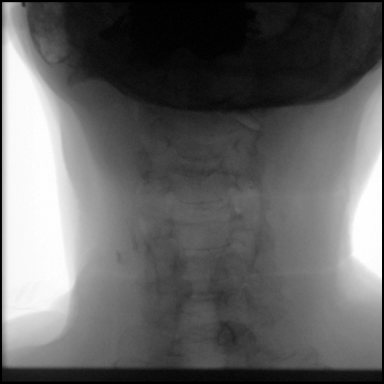
[frame 14/88]
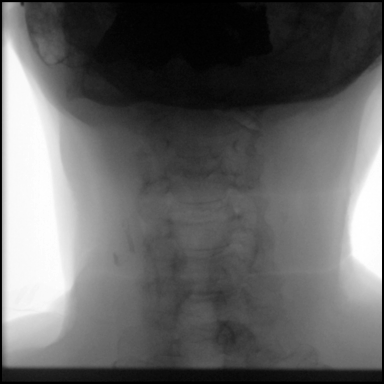
[frame 75/88]
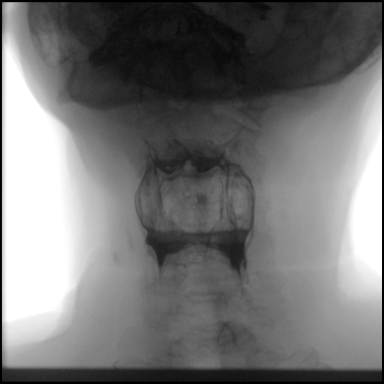

[Series 2: cp_standard · 0.34mm/px · 3 of 128 frames shown (2 of 8)]
[frame 14/128]
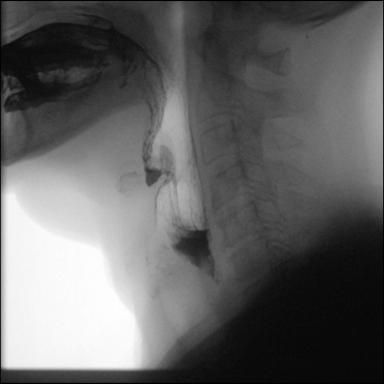
[frame 20/128]
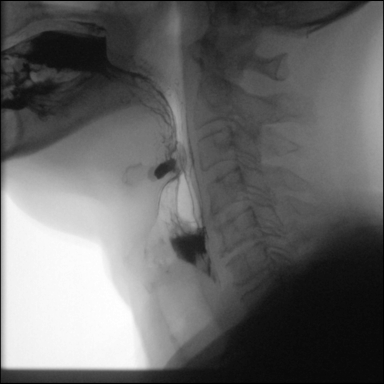
[frame 109/128]
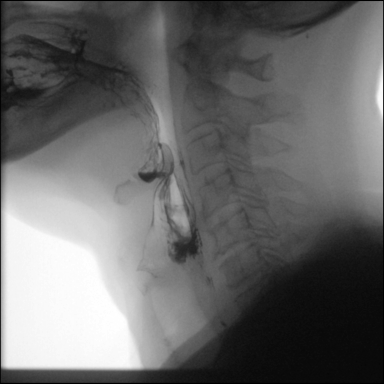

[Series 3: cp_standard · 0.34mm/px · 3 of 122 frames shown (3 of 8)]
[frame 19/122]
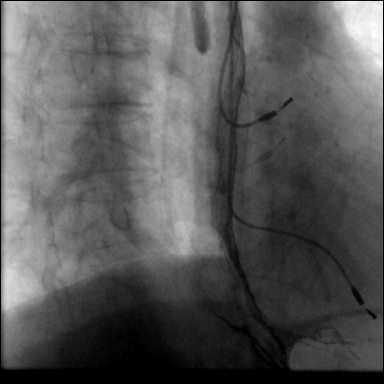
[frame 62/122]
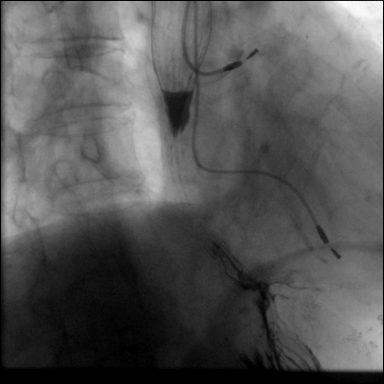
[frame 104/122]
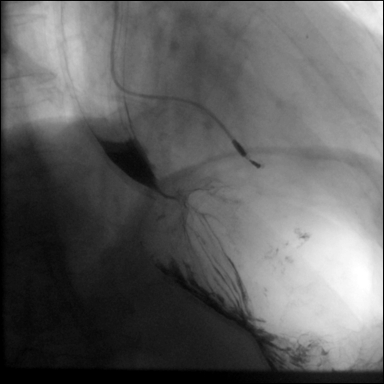

[Series 4: cp_standard · 0.34mm/px · 3 of 71 frames shown (4 of 8)]
[frame 36/71]
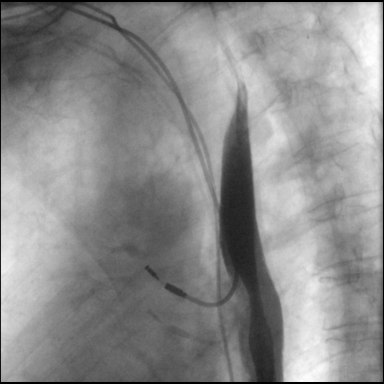
[frame 61/71]
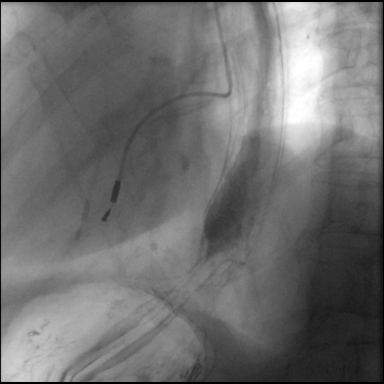
[frame 71/71]
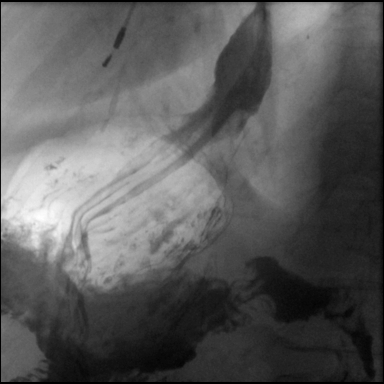

[Series 5: cp_standard · 0.35mm/px · 3 of 10 frames shown (5 of 8)]
[frame 5/10]
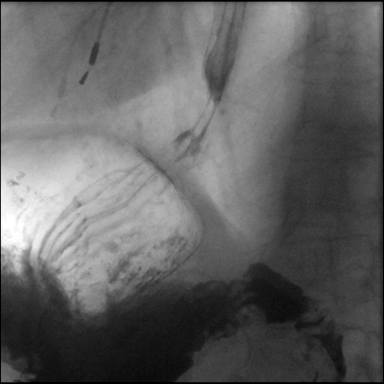
[frame 6/10]
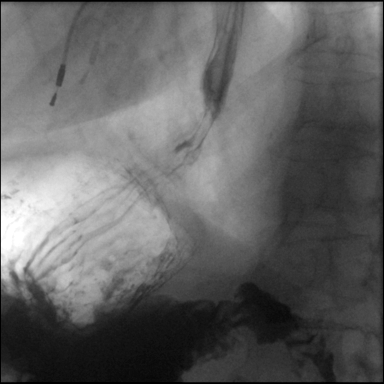
[frame 9/10]
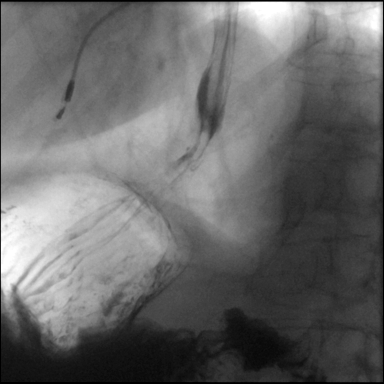

[Series 6: cp_standard · 0.35mm/px · 3 of 62 frames shown (6 of 8)]
[frame 32/62]
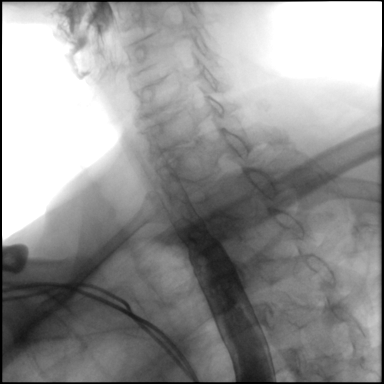
[frame 53/62]
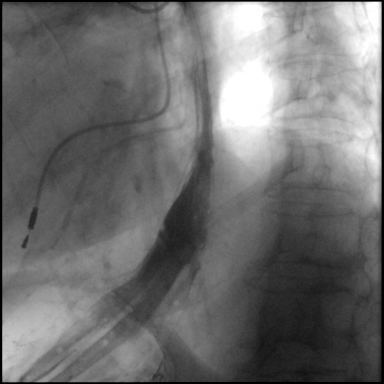
[frame 61/62]
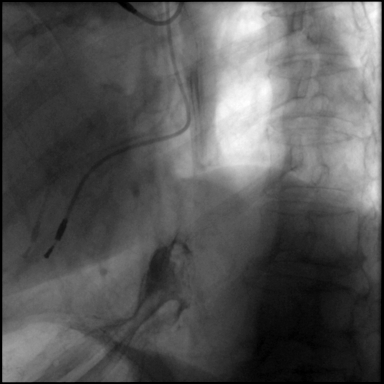

[Series 7: cp_standard · 0.35mm/px · 3 of 47 frames shown (7 of 8)]
[frame 8/47]
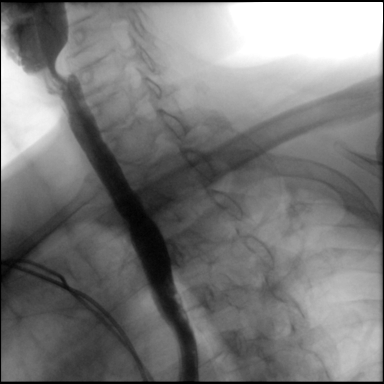
[frame 30/47]
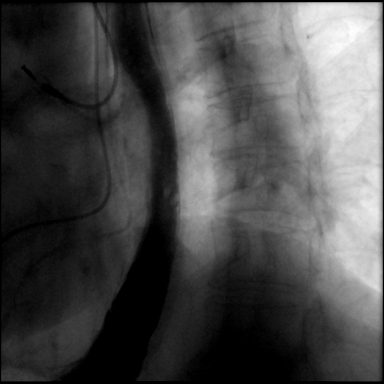
[frame 40/47]
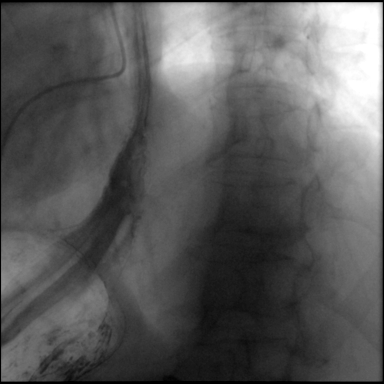

[Series 8: cp_standard · 0.36mm/px · 3 of 18 frames shown (8 of 8)]
[frame 3/18]
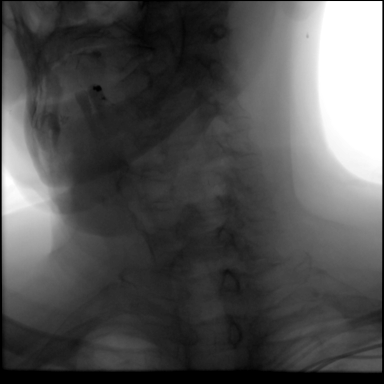
[frame 16/18]
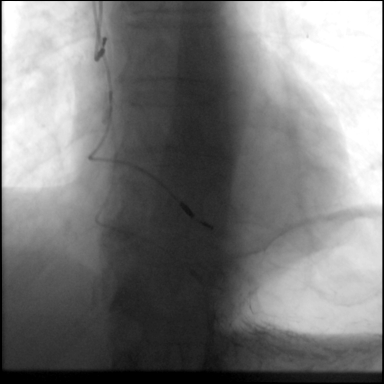
[frame 18/18]
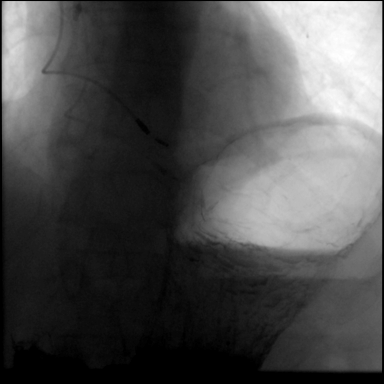

[24 of 24 positions shown; findings below may reference images not displayed]

FINDINGS: Fairly mild atherosclerotic calcification projecting over the
expected locations of the carotid arteries.

The patient had somewhat piecemeal swallowing, with multiple small
swallows taken from a medium-size oral bolus. The patient had
laryngeal penetration on the initial swallow assessing the
pharyngeal phase, and laryngeal penetration to coat the top of the
cords on the second oral bolus.

Prominent cricopharyngeus muscle noted for example on image 29
series 2 and also on image 23 series 4. This causes an impression on
the cervical contrast column. Mild cervical spondylosis with
anterior spurring at the C5-6 level.

On the double contrast portion of the exam there is no substantial
erosions or ulcerations in the esophagus identified. Mildly
accentuated longitudinal folds in the distal esophagus could reflect
esophagitis.

Small type 1 hiatal hernia. On image 50 of series 6 we demonstrate a
benign distal esophageal mucosal ring measured at 1.9 cm in
diameter. Schatzki rings of this size can rarely cause symptoms.

Primary peristaltic waves in the esophagus were intact on [DATE]
swallows, within normal limits.

Although the patient was initially dubious about ability to swallow
the pill, he was able to swallow the pill along with water on the
first attempt, and the 13 mm in diameter barium tablet passed
briskly into the stomach.

Incidental note is made of a dual lead pacer device.
IMPRESSION: 1. The patient has piecemeal swallowing, taking multiple small
volume swallows from an initial medium-sized oral barium boluses
throughout the exam. There is also laryngeal penetration to the
cords and a prominent cricopharyngeus muscle. The patient did admit
to some apprehension regarding swallowing pills based on past
unpleasant experiences. It is possible that given these findings,
the patient might benefit from dedicated speech pathologist
assessment.
2. Small type 1 hiatal hernia.
3. 1.9 cm in diameter Schatzki ring. Rings of this size can rarely
cause symptoms.
4. There is a suggestion of mild distal esophageal fold thickening
which could reflect low-level esophagitis. No ulceration observed.
No stricture.

## 2023-06-05 ENCOUNTER — Encounter: Payer: Self-pay | Admitting: Family Medicine

## 2023-06-05 ENCOUNTER — Ambulatory Visit (INDEPENDENT_AMBULATORY_CARE_PROVIDER_SITE_OTHER): Payer: Medicare Other | Admitting: Family Medicine

## 2023-06-05 ENCOUNTER — Other Ambulatory Visit: Payer: Self-pay | Admitting: Cardiology

## 2023-06-05 VITALS — BP 126/64 | HR 61 | Ht 69.0 in | Wt 144.0 lb

## 2023-06-05 DIAGNOSIS — R1310 Dysphagia, unspecified: Secondary | ICD-10-CM | POA: Insufficient documentation

## 2023-06-05 DIAGNOSIS — Z950 Presence of cardiac pacemaker: Secondary | ICD-10-CM

## 2023-06-05 DIAGNOSIS — I5042 Chronic combined systolic (congestive) and diastolic (congestive) heart failure: Secondary | ICD-10-CM | POA: Insufficient documentation

## 2023-06-05 DIAGNOSIS — I10 Essential (primary) hypertension: Secondary | ICD-10-CM | POA: Diagnosis not present

## 2023-06-05 DIAGNOSIS — J439 Emphysema, unspecified: Secondary | ICD-10-CM | POA: Diagnosis not present

## 2023-06-05 DIAGNOSIS — R1314 Dysphagia, pharyngoesophageal phase: Secondary | ICD-10-CM | POA: Diagnosis not present

## 2023-06-05 NOTE — Progress Notes (Signed)
Established Patient Office Visit  Subjective  Patient ID: Donald Patel, male    DOB: 1940/10/19  Age: 82 y.o. MRN: [id number]  Chief Complaint  Patient presents with   Hypertension    HPI  Hypertension- Pt denies chest pain, SOB, dizziness, or heart palpitations.  Taking meds as directed w/o problems.  Denies medication side effects.  Ports home blood pressures on average around 130.  Sometimes as low as 110 sometimes into the 140s.  He also said that the battery life on his pacemaker is getting close to it and he has about 3 months left on it.  Since I last saw him they attempted to do an upper endoscopy but he had severe narrowing and they were unable to pass the area.  He says he really does not have any problems swallowing food is just pills.  He says really it is just the ibuprofen he is able to take his blood pressure pills without any difficulty.  They did a follow-up modified barium swallow.  Follow-up in January to go over the details of the studies and discuss a plan.  F/u emphysema- denies any recent flares or exacerbations.      ROS    Objective:     BP 126/64   Pulse 61   Ht 5\' 9"  (1.753 m)   Wt 144 lb (65.3 kg)   SpO2 98%   BMI 21.27 kg/m    Physical Exam Vitals and nursing note reviewed.  Constitutional:      Appearance: Normal appearance.  HENT:     Head: Normocephalic and atraumatic.  Eyes:     Conjunctiva/sclera: Conjunctivae normal.  Cardiovascular:     Rate and Rhythm: Normal rate and regular rhythm.  Pulmonary:     Effort: Pulmonary effort is normal.     Breath sounds: Normal breath sounds.  Skin:    General: Skin is warm and dry.  Neurological:     Mental Status: He is alert.  Psychiatric:        Mood and Affect: Mood normal.      No results found for any visits on 06/05/23.    The ASCVD Risk score (Arnett DK, et al., 2019) failed to calculate for the following reasons:   The 2019 ASCVD risk score is only valid for ages 70 to  23    Assessment & Plan:   Problem List Items Addressed This Visit       Cardiovascular and Mediastinum   Essential hypertension, benign - Primary   Well controlled. Continue current regimen. Follow up in  6 mo      Relevant Orders   CMP14+EGFR   Chronic combined systolic (congestive) and diastolic (congestive) heart failure (HCC)   No Sign of volume overload on exam today.  Blood pressure at goal.        Respiratory   Emphysema lung (HCC)   Stable no flares.         Digestive   Swallowing difficulty   He reports that he can swallow his medications without any difficulty it is its mostly the ibuprofen so we discussed switching to liquid ibuprofen.  They do make a children's an adult version which he could find.  And am always happy to change his prescription medications if needed.  Keep follow-up appointment in January with GI.        Other   PACEMAKER-[employer]   Likely need battery replacement this year.        Return in about  6 months (around 12/04/2023) for Hypertension.    Nani Gasser, MD

## 2023-06-05 NOTE — Assessment & Plan Note (Signed)
He reports that he can swallow his medications without any difficulty it is its mostly the ibuprofen so we discussed switching to liquid ibuprofen.  They do make a children's an adult version which he could find.  And am always happy to change his prescription medications if needed.  Keep follow-up appointment in January with GI.

## 2023-06-05 NOTE — Assessment & Plan Note (Signed)
Likely need battery replacement this year.

## 2023-06-05 NOTE — Assessment & Plan Note (Signed)
Well controlled. Continue current regimen. Follow up in  6 mo  

## 2023-06-05 NOTE — Assessment & Plan Note (Signed)
Stable no flares. 

## 2023-06-05 NOTE — Assessment & Plan Note (Addendum)
No Sign of volume overload on exam today.  Blood pressure at goal.

## 2023-06-06 LAB — CMP14+EGFR
ALT: 13 [IU]/L (ref 0–44)
AST: 19 [IU]/L (ref 0–40)
Albumin: 4.4 g/dL (ref 3.7–4.7)
Alkaline Phosphatase: 88 [IU]/L (ref 44–121)
BUN/Creatinine Ratio: 16 (ref 10–24)
BUN: 17 mg/dL (ref 8–27)
Bilirubin Total: 0.8 mg/dL (ref 0.0–1.2)
CO2: 25 mmol/L (ref 20–29)
Calcium: 9.6 mg/dL (ref 8.6–10.2)
Chloride: 90 mmol/L — ABNORMAL LOW (ref 96–106)
Creatinine, Ser: 1.07 mg/dL (ref 0.76–1.27)
Globulin, Total: 2.7 g/dL (ref 1.5–4.5)
Glucose: 86 mg/dL (ref 70–99)
Potassium: 3.9 mmol/L (ref 3.5–5.2)
Sodium: 131 mmol/L — ABNORMAL LOW (ref 134–144)
Total Protein: 7.1 g/dL (ref 6.0–8.5)
eGFR: 69 mL/min/{1.73_m2} (ref >59)

## 2023-06-09 ENCOUNTER — Encounter: Payer: Self-pay | Admitting: Family Medicine

## 2023-06-09 NOTE — Progress Notes (Signed)
Hi Angelo your sodium level is a little low at 131 and am not quite sure why normally it is normal range as well as the chloride.  Did you start drinking really large amounts of water recently?  Or severely cut back on sodium intake?  Kidney function looks great liver function looks great.  If no real changes I had like to recheck your sodium level in about 2 to 3 weeks

## 2023-06-19 DIAGNOSIS — R09A2 Foreign body sensation, throat: Secondary | ICD-10-CM | POA: Diagnosis not present

## 2023-06-19 DIAGNOSIS — J310 Chronic rhinitis: Secondary | ICD-10-CM | POA: Diagnosis not present

## 2023-06-19 DIAGNOSIS — R1313 Dysphagia, pharyngeal phase: Secondary | ICD-10-CM | POA: Diagnosis not present

## 2023-07-03 ENCOUNTER — Ambulatory Visit (INDEPENDENT_AMBULATORY_CARE_PROVIDER_SITE_OTHER): Payer: Medicare Other

## 2023-07-03 DIAGNOSIS — I442 Atrioventricular block, complete: Secondary | ICD-10-CM

## 2023-07-03 LAB — CUP PACEART REMOTE DEVICE CHECK
Battery Remaining Longevity: 1 mo
Battery Remaining Percentage: 1 %
Battery Voltage: 2.63 V
Brady Statistic AP VP Percent: 35 %
Brady Statistic AP VS Percent: 1 %
Brady Statistic AS VP Percent: 65 %
Brady Statistic AS VS Percent: 1 %
Brady Statistic RA Percent Paced: 34 %
Brady Statistic RV Percent Paced: 99 %
Date Time Interrogation Session: 20250123032749
Implantable Lead Connection Status: 753985
Implantable Lead Connection Status: 753985
Implantable Lead Implant Date: 20051123
Implantable Lead Implant Date: 20051123
Implantable Lead Location: 753859
Implantable Lead Location: 753860
Implantable Pulse Generator Implant Date: 20150219
Lead Channel Impedance Value: 410 Ohm
Lead Channel Impedance Value: 450 Ohm
Lead Channel Pacing Threshold Amplitude: 0.5 V
Lead Channel Pacing Threshold Amplitude: 1.875 V
Lead Channel Pacing Threshold Pulse Width: 0.5 ms
Lead Channel Pacing Threshold Pulse Width: 0.5 ms
Lead Channel Sensing Intrinsic Amplitude: 12 mV
Lead Channel Sensing Intrinsic Amplitude: 3.7 mV
Lead Channel Setting Pacing Amplitude: 2 V
Lead Channel Setting Pacing Amplitude: 2.125
Lead Channel Setting Pacing Pulse Width: 0.5 ms
Lead Channel Setting Sensing Sensitivity: 5 mV
Pulse Gen Model: 2240
Pulse Gen Serial Number: 7596156

## 2023-07-09 ENCOUNTER — Encounter: Payer: Self-pay | Admitting: Cardiology

## 2023-07-10 NOTE — Progress Notes (Signed)
Remote pacemaker transmission.

## 2023-07-18 ENCOUNTER — Other Ambulatory Visit: Payer: Self-pay | Admitting: Cardiology

## 2023-07-18 DIAGNOSIS — I5042 Chronic combined systolic (congestive) and diastolic (congestive) heart failure: Secondary | ICD-10-CM

## 2023-08-04 ENCOUNTER — Ambulatory Visit (INDEPENDENT_AMBULATORY_CARE_PROVIDER_SITE_OTHER): Payer: Medicare Other

## 2023-08-04 DIAGNOSIS — I442 Atrioventricular block, complete: Secondary | ICD-10-CM

## 2023-08-04 LAB — CUP PACEART REMOTE DEVICE CHECK
Battery Remaining Longevity: 1 mo
Battery Remaining Percentage: 0.5 %
Battery Voltage: 2.59 V
Brady Statistic AP VP Percent: 34 %
Brady Statistic AP VS Percent: 1 %
Brady Statistic AS VP Percent: 66 %
Brady Statistic AS VS Percent: 1 %
Brady Statistic RA Percent Paced: 34 %
Brady Statistic RV Percent Paced: 99 %
Date Time Interrogation Session: 20250223020013
Implantable Lead Connection Status: 753985
Implantable Lead Connection Status: 753985
Implantable Lead Implant Date: 20051123
Implantable Lead Implant Date: 20051123
Implantable Lead Location: 753859
Implantable Lead Location: 753860
Implantable Pulse Generator Implant Date: 20150219
Lead Channel Impedance Value: 450 Ohm
Lead Channel Impedance Value: 460 Ohm
Lead Channel Pacing Threshold Amplitude: 0.5 V
Lead Channel Pacing Threshold Amplitude: 2 V
Lead Channel Pacing Threshold Pulse Width: 0.5 ms
Lead Channel Pacing Threshold Pulse Width: 0.5 ms
Lead Channel Sensing Intrinsic Amplitude: 12 mV
Lead Channel Sensing Intrinsic Amplitude: 3.5 mV
Lead Channel Setting Pacing Amplitude: 2 V
Lead Channel Setting Pacing Amplitude: 2.25 V
Lead Channel Setting Pacing Pulse Width: 0.5 ms
Lead Channel Setting Sensing Sensitivity: 5 mV
Pulse Gen Model: 2240
Pulse Gen Serial Number: 7596156

## 2023-08-05 ENCOUNTER — Telehealth: Payer: Self-pay

## 2023-08-05 NOTE — Telephone Encounter (Signed)
 Alert received from [employer]:  Monthly battery check.  Estimated <3 months, battery voltage 2.59V, ERI @ 2.6V, routed to triage. Normal device function. No new alerts.  Per review of Pt chart, Pt overdue for EP follow up.  Message sent to scheduling to set up appointment with next available to discuss generator change.

## 2023-08-14 NOTE — Progress Notes (Signed)
 Remote pacemaker transmission.

## 2023-08-25 ENCOUNTER — Telehealth: Payer: Self-pay

## 2023-08-25 NOTE — Telephone Encounter (Signed)
 Alert remote transmission: Device at ERI (reached on Aug 24, 2023); routed to clinic for review.  Follow up as scheduled. [facility], [facility]   Has appointment with Canary Brim NP 3/20. Called and made patient aware.

## 2023-08-27 NOTE — Progress Notes (Unsigned)
  Electrophysiology Office Note:   Date:  08/28/2023  ID:  Donald Patel, DOB Jul 29, 1940, MRN [id number]  Primary Cardiologist: None Primary Heart Failure: None Electrophysiologist: Will Jorja Loa, MD       History of Present Illness:   Donald Patel is a 83 y.o. male with h/o CHB s/p PPM, HFrEF / NICM,  HTN, HLD, AF (low burden, not anticoagulated) seen today for routine electrophysiology followup.   Alert from [facility] > pt reached ERI as of 08/24/23.  Since last being seen in our clinic the patient reports doing well overall. He notes his PPM site hurts at times and has a large scar.  He puts Vitamin E Oil on the site for relief. He doesn't understand why his "scar looks the way it does".   He denies chest pain, palpitations, dyspnea, PND, orthopnea, nausea, vomiting, dizziness, syncope, edema, weight gain, or early satiety.   Review of systems complete and found to be negative unless listed in HPI.   EP Information / Studies Reviewed:    EKG is not ordered today. EKG from 03/26/2023 reviewed which showed AV dual-paced rhythm, 60 bpm      PPM Interrogation-  reviewed in detail today,  See PACEART report.  Device History: Abbott Dual Chamber PPM implanted 05/02/2004 for CHB Generator Change: 07/29/2013 No R Waves at 40 bpm 08/28/23   Studies:  ECHO 11/2020 > LVEF 40-45% Coronary CTA 01/2021 > CCS 0, NICM     Arrhythmia / AAD Paroxysmal Atrial Fibrillation         Physical Exam:   VS:  BP (!) 142/66   Pulse 69   Ht 5\' 10"  (1.778 m)   Wt 142 lb 11.2 oz (64.7 kg)   SpO2 98%   BMI 20.48 kg/m    Wt Readings from Last 3 Encounters:  08/28/23 142 lb 11.2 oz (64.7 kg)  06/05/23 144 lb (65.3 kg)  04/11/23 144 lb (65.3 kg)     GEN: elderly male, well nourished, well developed in no acute distress NECK: No JVD; No carotid bruits CARDIAC: Regular rate and rhythm, no murmurs, rubs, gallops, generator site with keloid-type scar and multiple old incision lines, no  tethering  RESPIRATORY:  Clear to auscultation without rales, wheezing or rhonchi  ABDOMEN: Soft, non-tender, non-distended EXTREMITIES:  No edema; No deformity      ASSESSMENT AND PLAN:    CHB s/p Abbott PPM  -Normal PPM function -See Pace Art report -No changes today -device at RRT > generator change discussed with patient in detail, scheduled for Friday 09/19/23 with Dr. Elberta Fortis -note no R waves at 40 bpm / dependent   HFrEF / NICM  CCS 0 in 2022  -euvolemic on exam    -GDMT per primary Cardiology  Paroxysmal Atrial Fibrillation  -AMS on device were not AF by EGM, <1% burden on device are ST/AT with pacemaker wenckebach -not anticoagulated as has had low burden   Hypertension  -mildly elevated in clinic, monitor    Disposition:   Follow up with Dr. Elberta Fortis  as planned for generator change    Signed, Canary Brim, NP-C, AGACNP-BC [facility] - Electrophysiology  08/28/2023, 5:10 PM

## 2023-08-27 NOTE — H&P (View-Only) (Signed)
  Electrophysiology Office Note:   Date:  08/28/2023  ID:  Donald Patel, DOB Oct 22, 1940, MRN [id number]  Primary Cardiologist: None Primary Heart Failure: None Electrophysiologist: Will Jorja Loa, MD       History of Present Illness:   Donald Patel is a 83 y.o. male with h/o CHB s/p PPM, HFrEF / NICM,  HTN, HLD, AF (low burden, not anticoagulated) seen today for routine electrophysiology followup.   Alert from [facility] > pt reached ERI as of 08/24/23.  Since last being seen in our clinic the patient reports doing well overall. He notes his PPM site hurts at times and has a large scar.  He puts Vitamin E Oil on the site for relief. He doesn't understand why his "scar looks the way it does".   He denies chest pain, palpitations, dyspnea, PND, orthopnea, nausea, vomiting, dizziness, syncope, edema, weight gain, or early satiety.   Review of systems complete and found to be negative unless listed in HPI.   EP Information / Studies Reviewed:    EKG is not ordered today. EKG from 03/26/2023 reviewed which showed AV dual-paced rhythm, 60 bpm      PPM Interrogation-  reviewed in detail today,  See PACEART report.  Device History: Abbott Dual Chamber PPM implanted 05/02/2004 for CHB Generator Change: 07/29/2013 No R Waves at 40 bpm 08/28/23   Studies:  ECHO 11/2020 > LVEF 40-45% Coronary CTA 01/2021 > CCS 0, NICM     Arrhythmia / AAD Paroxysmal Atrial Fibrillation         Physical Exam:   VS:  BP (!) 142/66   Pulse 69   Ht 5\' 10"  (1.778 m)   Wt 142 lb 11.2 oz (64.7 kg)   SpO2 98%   BMI 20.48 kg/m    Wt Readings from Last 3 Encounters:  08/28/23 142 lb 11.2 oz (64.7 kg)  06/05/23 144 lb (65.3 kg)  04/11/23 144 lb (65.3 kg)     GEN: elderly male, well nourished, well developed in no acute distress NECK: No JVD; No carotid bruits CARDIAC: Regular rate and rhythm, no murmurs, rubs, gallops, generator site with keloid-type scar and multiple old incision lines, no  tethering  RESPIRATORY:  Clear to auscultation without rales, wheezing or rhonchi  ABDOMEN: Soft, non-tender, non-distended EXTREMITIES:  No edema; No deformity      ASSESSMENT AND PLAN:    CHB s/p Abbott PPM  -Normal PPM function -See Pace Art report -No changes today -device at RRT > generator change discussed with patient in detail, scheduled for Friday 09/19/23 with Dr. Elberta Fortis -note no R waves at 40 bpm / dependent   HFrEF / NICM  CCS 0 in 2022  -euvolemic on exam    -GDMT per primary Cardiology  Paroxysmal Atrial Fibrillation  -AMS on device were not AF by EGM, <1% burden on device are ST/AT with pacemaker wenckebach -not anticoagulated as has had low burden   Hypertension  -mildly elevated in clinic, monitor    Disposition:   Follow up with Dr. Elberta Fortis  as planned for generator change    Signed, Canary Brim, NP-C, AGACNP-BC [facility] - Electrophysiology  08/28/2023, 5:10 PM

## 2023-08-28 ENCOUNTER — Encounter: Payer: Self-pay | Admitting: Pulmonary Disease

## 2023-08-28 ENCOUNTER — Ambulatory Visit: Attending: Pulmonary Disease | Admitting: Pulmonary Disease

## 2023-08-28 ENCOUNTER — Encounter: Payer: Self-pay | Admitting: *Deleted

## 2023-08-28 VITALS — BP 142/66 | HR 69 | Ht 70.0 in | Wt 142.7 lb

## 2023-08-28 DIAGNOSIS — I10 Essential (primary) hypertension: Secondary | ICD-10-CM

## 2023-08-28 DIAGNOSIS — Z950 Presence of cardiac pacemaker: Secondary | ICD-10-CM | POA: Diagnosis not present

## 2023-08-28 DIAGNOSIS — I442 Atrioventricular block, complete: Secondary | ICD-10-CM

## 2023-08-28 LAB — CUP PACEART INCLINIC DEVICE CHECK
Battery Remaining Longevity: 0 mo
Battery Voltage: 2.57 V
Brady Statistic RA Percent Paced: 34 %
Brady Statistic RV Percent Paced: 99.96 %
Date Time Interrogation Session: 20250320171400
Implantable Lead Connection Status: 753985
Implantable Lead Connection Status: 753985
Implantable Lead Implant Date: 20051123
Implantable Lead Implant Date: 20051123
Implantable Lead Location: 753859
Implantable Lead Location: 753860
Implantable Pulse Generator Implant Date: 20150219
Lead Channel Impedance Value: 462.5 Ohm
Lead Channel Impedance Value: 487.5 Ohm
Lead Channel Pacing Threshold Amplitude: 0.5 V
Lead Channel Pacing Threshold Amplitude: 0.5 V
Lead Channel Pacing Threshold Amplitude: 1.25 V
Lead Channel Pacing Threshold Pulse Width: 0.5 ms
Lead Channel Pacing Threshold Pulse Width: 0.5 ms
Lead Channel Pacing Threshold Pulse Width: 0.5 ms
Lead Channel Sensing Intrinsic Amplitude: 4.3 mV
Lead Channel Setting Pacing Amplitude: 1.5 V
Lead Channel Setting Pacing Amplitude: 2 V
Lead Channel Setting Pacing Pulse Width: 0.5 ms
Lead Channel Setting Sensing Sensitivity: 5 mV
Pulse Gen Model: 2240
Pulse Gen Serial Number: 7596156

## 2023-08-28 NOTE — Patient Instructions (Signed)
 Medication Instructions:  Your physician recommends that you continue on your current medications as directed. Please refer to the Current Medication list given to you today.  *If you need a refill on your cardiac medications before your next appointment, please call your pharmacy*  Lab Work: BMET, CBC-TODAY If you have labs (blood work) drawn today and your tests are completely normal, you will receive your results only by: MyChart Message (if you have MyChart) OR A paper copy in the mail If you have any lab test that is abnormal or we need to change your treatment, we will call you to review the results.  Testing/Procedures: See letter  Follow-Up: At [facility], you and your health needs are our priority.  As part of our continuing mission to provide you with exceptional heart care, we have created designated Provider Care Teams.  These Care Teams include your primary Cardiologist (physician) and Advanced Practice Providers (APPs -  Physician Assistants and Nurse Practitioners) who all work together to provide you with the care you need, when you need it.  Your next appointment:   Your follow up appointments will be arranged for you and print out on your discharge paperwork after your procedure.

## 2023-08-29 LAB — BASIC METABOLIC PANEL
BUN/Creatinine Ratio: 16 (ref 10–24)
BUN: 19 mg/dL (ref 8–27)
CO2: 25 mmol/L (ref 20–29)
Calcium: 10 mg/dL (ref 8.6–10.2)
Chloride: 94 mmol/L — ABNORMAL LOW (ref 96–106)
Creatinine, Ser: 1.2 mg/dL (ref 0.76–1.27)
Glucose: 96 mg/dL (ref 70–99)
Potassium: 4 mmol/L (ref 3.5–5.2)
Sodium: 135 mmol/L (ref 134–144)
eGFR: 60 mL/min/{1.73_m2} (ref >59)

## 2023-08-29 LAB — CBC
Hematocrit: 44.6 % (ref 37.5–51.0)
Hemoglobin: 15.6 g/dL (ref 13.0–17.7)
MCH: 32.7 pg (ref 26.6–33.0)
MCHC: 35 g/dL (ref 31.5–35.7)
MCV: 94 fL (ref 79–97)
Platelets: 213 10*3/uL (ref 150–450)
RBC: 4.77 x10E6/uL (ref 4.14–5.80)
RDW: 12.4 % (ref 11.6–15.4)
WBC: 9.2 10*3/uL (ref 3.4–10.8)

## 2023-09-04 ENCOUNTER — Ambulatory Visit (INDEPENDENT_AMBULATORY_CARE_PROVIDER_SITE_OTHER): Payer: Medicare Other

## 2023-09-04 DIAGNOSIS — I442 Atrioventricular block, complete: Secondary | ICD-10-CM | POA: Diagnosis not present

## 2023-09-04 LAB — CUP PACEART REMOTE DEVICE CHECK
Battery Remaining Longevity: 0 mo
Battery Voltage: 2.57 V
Brady Statistic AP VP Percent: 10 %
Brady Statistic AP VS Percent: 1 %
Brady Statistic AS VP Percent: 90 %
Brady Statistic AS VS Percent: 1 %
Brady Statistic RA Percent Paced: 10 %
Brady Statistic RV Percent Paced: 99 %
Date Time Interrogation Session: 20250327020013
Implantable Lead Connection Status: 753985
Implantable Lead Connection Status: 753985
Implantable Lead Implant Date: 20051123
Implantable Lead Implant Date: 20051123
Implantable Lead Location: 753859
Implantable Lead Location: 753860
Implantable Pulse Generator Implant Date: 20150219
Lead Channel Impedance Value: 450 Ohm
Lead Channel Impedance Value: 460 Ohm
Lead Channel Pacing Threshold Amplitude: 0.5 V
Lead Channel Pacing Threshold Amplitude: 1.625 V
Lead Channel Pacing Threshold Pulse Width: 0.5 ms
Lead Channel Pacing Threshold Pulse Width: 0.5 ms
Lead Channel Sensing Intrinsic Amplitude: 12 mV
Lead Channel Sensing Intrinsic Amplitude: 4.2 mV
Lead Channel Setting Pacing Amplitude: 1.875
Lead Channel Setting Pacing Amplitude: 2 V
Lead Channel Setting Pacing Pulse Width: 0.5 ms
Lead Channel Setting Sensing Sensitivity: 5 mV
Pulse Gen Model: 2240
Pulse Gen Serial Number: 7596156

## 2023-09-10 NOTE — Progress Notes (Signed)
 Remote pacemaker transmission.

## 2023-09-12 ENCOUNTER — Telehealth (HOSPITAL_COMMUNITY): Payer: Self-pay

## 2023-09-12 NOTE — Telephone Encounter (Signed)
 Call placed to patient to discuss upcoming procedure.   Confirmed patient is scheduled for a PPM generator change on Friday, April 11 with Dr. Loman Brooklyn. Instructed patient to arrive at the Main Entrance A at [facility]: [address] [place], [place] [address] and check in at Admitting at 12:30 PM.   Labs completed  Any recent signs of acute illness or been started on antibiotics? No Any new medications started? No Any medications to hold? ASA 2 days Medication instructions:  On the morning of your procedure take all other AM medications with small sips of water. No eating or drinking after midnight prior to procedure.   The night before your procedure and the morning of your procedure scrub your neck/chest with the CHG surgical soap.   Advised of plan to go home the same day and will only stay overnight if medically necessary. You MUST have a responsible adult to drive you home and MUST be with you the first 24 hours after you arrive home.  Patient verbalized understanding to all instructions provided and agreed to proceed with procedure.

## 2023-09-18 NOTE — Pre-Procedure Instructions (Signed)
 Instructed patient on the following items: Arrival time 1400 Nothing to eat or drink after midnight No meds AM of procedure Responsible person to drive you home and stay with you for 24 hrs Wash with special soap night before and morning of procedure If on anti-coagulant drug instructions ASA- hold for 2 days, last dose 4/8

## 2023-09-19 ENCOUNTER — Other Ambulatory Visit: Payer: Self-pay

## 2023-09-19 ENCOUNTER — Ambulatory Visit (HOSPITAL_COMMUNITY)
Admission: RE | Admit: 2023-09-19 | Discharge: 2023-09-19 | Disposition: A | Discharge status: Home / Self Care | Attending: Cardiology | Admitting: Cardiology

## 2023-09-19 ENCOUNTER — Encounter (HOSPITAL_COMMUNITY)
Admission: RE | Disposition: A | Discharge status: Home / Self Care | Payer: Self-pay | Source: Home / Self Care | Attending: Cardiology

## 2023-09-19 DIAGNOSIS — I428 Other cardiomyopathies: Secondary | ICD-10-CM | POA: Insufficient documentation

## 2023-09-19 DIAGNOSIS — I5022 Chronic systolic (congestive) heart failure: Secondary | ICD-10-CM | POA: Insufficient documentation

## 2023-09-19 DIAGNOSIS — E785 Hyperlipidemia, unspecified: Secondary | ICD-10-CM | POA: Diagnosis not present

## 2023-09-19 DIAGNOSIS — R001 Bradycardia, unspecified: Secondary | ICD-10-CM | POA: Diagnosis not present

## 2023-09-19 DIAGNOSIS — Z79899 Other long term (current) drug therapy: Secondary | ICD-10-CM | POA: Diagnosis not present

## 2023-09-19 DIAGNOSIS — I480 Paroxysmal atrial fibrillation: Secondary | ICD-10-CM | POA: Diagnosis not present

## 2023-09-19 DIAGNOSIS — I442 Atrioventricular block, complete: Secondary | ICD-10-CM | POA: Insufficient documentation

## 2023-09-19 DIAGNOSIS — Z45010 Encounter for checking and testing of cardiac pacemaker pulse generator [battery]: Secondary | ICD-10-CM | POA: Diagnosis not present

## 2023-09-19 DIAGNOSIS — I110 Hypertensive heart disease with heart failure: Secondary | ICD-10-CM | POA: Diagnosis not present

## 2023-09-19 HISTORY — PX: PPM GENERATOR CHANGEOUT: EP1233

## 2023-09-19 SURGERY — PPM GENERATOR CHANGEOUT

## 2023-09-19 MED ORDER — CEFAZOLIN SODIUM-DEXTROSE 2-4 GM/100ML-% IV SOLN
2.0000 g | INTRAVENOUS | Status: AC
  Administered 2023-09-19: 2 g via INTRAVENOUS

## 2023-09-19 MED ORDER — SODIUM CHLORIDE 0.9 % IV SOLN: 80.0000 mg | INTRAVENOUS | Status: AC

## 2023-09-19 MED ORDER — ACETAMINOPHEN 325 MG PO TABS: 325.0000 mg | ORAL_TABLET | ORAL | Status: DC | PRN

## 2023-09-19 MED ORDER — LIDOCAINE HCL (PF) 1 % IJ SOLN
INTRAMUSCULAR | Status: DC | PRN
  Administered 2023-09-19: 55 mL

## 2023-09-19 MED ORDER — CEFAZOLIN SODIUM-DEXTROSE 2-4 GM/100ML-% IV SOLN
INTRAVENOUS | Status: AC
  Filled 2023-09-19: qty 100

## 2023-09-19 MED ORDER — LIDOCAINE HCL (PF) 1 % IJ SOLN
INTRAMUSCULAR | Status: AC
Start: 2023-09-19 — End: ?
  Filled 2023-09-19: qty 60

## 2023-09-19 MED ORDER — SODIUM CHLORIDE 0.9 % IV SOLN
INTRAVENOUS | Status: DC
Start: 2023-09-19 — End: 2023-09-19

## 2023-09-19 MED ORDER — ONDANSETRON HCL 4 MG/2ML IJ SOLN: 4.0000 mg | Freq: Four times a day (QID) | INTRAMUSCULAR | Status: DC | PRN

## 2023-09-19 MED ORDER — CHLORHEXIDINE GLUCONATE 4 % EX SOLN
4.0000 | Freq: Once | CUTANEOUS | Status: DC
  Filled 2023-09-19: qty 60

## 2023-09-19 MED ORDER — SODIUM CHLORIDE 0.9 % IV SOLN
INTRAVENOUS | Status: AC
  Administered 2023-09-19: 80 mg
  Filled 2023-09-19: qty 2

## 2023-09-19 MED ORDER — POVIDONE-IODINE 10 % EX SWAB
2.0000 | Freq: Once | CUTANEOUS | Status: AC
  Administered 2023-09-19: 2 via TOPICAL

## 2023-09-19 SURGICAL SUPPLY — 9 items
CABLE SURGICAL S-101-97-12 (CABLE) ×1 IMPLANT
KIT WRENCH (KITS) IMPLANT
KIT WRENCH PACEMAKER ASSEM (MISCELLANEOUS) IMPLANT
PACEMAKER ASSURITY DR-RF (Pacemaker) IMPLANT
PAD DEFIB RADIO PHYSIO CONN (PAD) ×1 IMPLANT
POUCH AIGIS-R ANTIBACT PPM (Mesh General) ×1 IMPLANT
POUCH AIGIS-R ANTIBACT PPM MED (Mesh General) IMPLANT
SHEATH PROBE COVER 6X72 (BAG) IMPLANT
TRAY PACEMAKER INSERTION (PACKS) ×1 IMPLANT

## 2023-09-19 NOTE — Interval H&P Note (Signed)
 History and Physical Interval Note:  09/19/2023 3:06 PM  Koleen Nimrod  has presented today for surgery, with the diagnosis of eri.  The various methods of treatment have been discussed with the patient and family. After consideration of risks, benefits and other options for treatment, the patient has consented to  Procedure(s): PPM GENERATOR CHANGEOUT (N/A) as a surgical intervention.  The patient's history has been reviewed, patient examined, no change in status, stable for surgery.  I have reviewed the patient's chart and labs.  Questions were answered to the patient's satisfaction.     Endi Lagman [employer]

## 2023-09-19 NOTE — Discharge Instructions (Signed)

## 2023-09-22 ENCOUNTER — Encounter (HOSPITAL_COMMUNITY): Payer: Self-pay | Admitting: Cardiology

## 2023-10-02 ENCOUNTER — Ambulatory Visit: Attending: Cardiovascular Disease

## 2023-10-02 DIAGNOSIS — I442 Atrioventricular block, complete: Secondary | ICD-10-CM

## 2023-10-02 LAB — CUP PACEART INCLINIC DEVICE CHECK
Battery Remaining Longevity: 117 mo
Battery Voltage: 3.05 V
Brady Statistic RA Percent Paced: 41 %
Brady Statistic RV Percent Paced: 99.96 %
Date Time Interrogation Session: 20250424143851
Implantable Lead Connection Status: 753985
Implantable Lead Connection Status: 753985
Implantable Lead Implant Date: 20051123
Implantable Lead Implant Date: 20051123
Implantable Lead Location: 753859
Implantable Lead Location: 753860
Implantable Pulse Generator Implant Date: 20250411
Lead Channel Impedance Value: 450 Ohm
Lead Channel Impedance Value: 475 Ohm
Lead Channel Pacing Threshold Amplitude: 0.75 V
Lead Channel Pacing Threshold Amplitude: 0.75 V
Lead Channel Pacing Threshold Amplitude: 1.5 V
Lead Channel Pacing Threshold Amplitude: 1.5 V
Lead Channel Pacing Threshold Pulse Width: 0.5 ms
Lead Channel Pacing Threshold Pulse Width: 0.5 ms
Lead Channel Pacing Threshold Pulse Width: 0.5 ms
Lead Channel Pacing Threshold Pulse Width: 0.5 ms
Lead Channel Sensing Intrinsic Amplitude: 4 mV
Lead Channel Sensing Intrinsic Amplitude: 9 mV
Lead Channel Setting Pacing Amplitude: 1.625
Lead Channel Setting Pacing Amplitude: 2 V
Lead Channel Setting Pacing Pulse Width: 0.5 ms
Lead Channel Setting Sensing Sensitivity: 5 mV
Pulse Gen Model: 2272
Pulse Gen Serial Number: 8256415

## 2023-10-02 NOTE — Patient Instructions (Signed)

## 2023-10-02 NOTE — Progress Notes (Signed)
 Normal DUAL chamber pacemaker wound check. Presenting rhythm: AP/VP 60 . Wound well healed. Routine testing performed. Thresholds, sensing, and impedances consistent with implant measurements and appropriate safety margins for chronic leads. No episodes. No arm restrictions.  Pt enrolled in remote follow-up.

## 2023-10-06 NOTE — Progress Notes (Signed)
 HPI: Follow-up chronic combined systolic/diastolic congestive heart failure, history of pacemaker, hypertension and paroxysmal atrial fibrillation. Echocardiogram June 2022 showed ejection fraction 40 to 45%, trace aortic insufficiency and mildly dilated aortic root at 40 mm.  Cardiac CTA August 2022 showed calcium score 0, minimal plaque in the first diagonal and distal myocardial bridge.  There was note of aortic atherosclerosis.  Echocardiogram repeated January 2024 and showed ejection fraction 50 to 55%, grade 1 diastolic dysfunction.  Had pacemaker generator change April 2025.  Since last seen he denies dyspnea, chest pain, palpitations or syncope.  Current Outpatient Medications  Medication Sig Dispense Refill   acetaminophen  (TYLENOL ) 160 MG/5ML suspension Take 320 mg by mouth every 6 (six) hours as needed for moderate pain (pain score 4-6) or mild pain (pain score 1-3).     aspirin  EC 81 MG tablet Take 1 tablet (81 mg total) by mouth daily. Swallow whole. (Patient taking differently: Take 81 mg by mouth every other day. Swallow whole.) 90 tablet 3   Cholecalciferol (VITAMIN D3) LIQD Take 4 drops by mouth daily. In Water 1000 mcg     hydrochlorothiazide  (HYDRODIURIL ) 25 MG tablet TAKE 1 TABLET(25 MG) BY MOUTH DAILY 90 tablet 3   ibuprofen (ADVIL) 200 MG tablet Take 200 mg by mouth daily as needed for mild pain (pain score 1-3) or moderate pain (pain score 4-6).     ipratropium (ATROVENT) 0.06 % nasal spray Place 2 sprays into both nostrils 2 (two) times daily.     losartan  (COZAAR ) 25 MG tablet TAKE 1 TABLET(25 MG) BY MOUTH DAILY 90 tablet 2   metoprolol  succinate (TOPROL -XL) 25 MG 24 hr tablet Take 1 tablet (25 mg total) by mouth daily. 90 tablet 3   Multiple Vitamins-Minerals (PRESERVISION AREDS 2) CHEW Chew 1 tablet by mouth daily.     No current facility-administered medications for this visit.     Past Medical History:  Diagnosis Date   AV BLOCK, COMPLETE    s/p PPM (SJM)    Cataract both eyes corrected   Chronic right-sided low back pain with right-sided sciatica 11/09/2019   Emphysema lung (HCC) 11/09/2020   ERECTILE DYSFUNCTION, ORGANIC    History of colon polyps    Hypertension    Hypertension     Past Surgical History:  Procedure Laterality Date   CATARACT EXTRACTION W/ INTRAOCULAR LENS IMPLANT  2013   right eye - [facility]   CATARACT EXTRACTION W/ INTRAOCULAR LENS IMPLANT  2015   Left eye - [facility]   COLONOSCOPY  11/29/2015   [facility]   KNEE SURGERY     PACEMAKER PLACEMENT  12/22/96   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, most recent generator (SJM) 05/02/04 both implanted by Dr Jack Marts [facility]   PERMANENT PACEMAKER GENERATOR CHANGE N/A 07/29/2013   Procedure: PERMANENT PACEMAKER GENERATOR CHANGE;  Surgeon: Ellaree Gunther, MD;  Location: [facility] CATH LAB;  Service: Cardiovascular;  Laterality: N/A;   PPM GENERATOR CHANGEOUT N/A 09/19/2023   Procedure: PPM GENERATOR CHANGEOUT;  Surgeon: Lei Pump, MD;  Location: [facility] INVASIVE CV LAB;  Service: Cardiovascular;  Laterality: N/A;   VASECTOMY      Social History   Socioeconomic History   Marital status: Married    Spouse name: Not on file   Number of children: Not on file   Years of education: Not on file   Highest education level: 12th grade  Occupational History   Occupation: Retired  Tobacco Use   Smoking status:  Former    Current packs/day: 0.00    Average packs/day: 1.5 packs/day for 35.0 years (52.5 ttl pk-yrs)    Types: Cigarettes, Pipe, Cigars    Start date: 06/10/1950    Quit date: 06/10/1985    Years since quitting: 38.3   Smokeless tobacco: Never  Substance and Sexual Activity   Alcohol use: Yes    Alcohol/week: 14.0 standard drinks of alcohol    Types: 14 Cans of beer per week   Drug use: No   Sexual activity: Never  Other Topics Concern   Not on file  Social History Narrative   Recently retired,Former Chartered certified accountant.  Exercise.    Social Drivers of Manufacturing engineer Strain: Low Risk  (06/01/2023)   Overall Financial Resource Strain (CARDIA)    Difficulty of Paying Living Expenses: Not hard at all  Food Insecurity: No Food Insecurity (06/01/2023)   Hunger Vital Sign    Worried About Running Out of Food in the Last Year: Never true    Ran Out of Food in the Last Year: Never true  Transportation Needs: No Transportation Needs (06/01/2023)   PRAPARE - Administrator, Civil Service (Medical): No    Lack of Transportation (Non-Medical): No  Physical Activity: Unknown (06/01/2023)   Exercise Vital Sign    Days of Exercise per Week: 0 days    Minutes of Exercise per Session: Not on file  Stress: No Stress Concern Present (06/01/2023)   [employer] of Occupational Health - Occupational Stress Questionnaire    Feeling of Stress : Not at all  Social Connections: Moderately Isolated (06/01/2023)   Social Connection and Isolation Panel [NHANES]    Frequency of Communication with Friends and Family: More than three times a week    Frequency of Social Gatherings with Friends and Family: Once a week    Attends Religious Services: Never    Database administrator or Organizations: No    Attends Engineer, structural: Not on file    Marital Status: Married  Catering manager Violence: Not on file    Family History  Problem Relation Age of Onset   Breast cancer Mother    Heart attack Brother    Diabetes Other    Hodgkin's lymphoma Brother    Colon cancer Neg Hx    Esophageal cancer Neg Hx    Rectal cancer Neg Hx    Stomach cancer Neg Hx     ROS: no fevers or chills, productive cough, hemoptysis, dysphasia, odynophagia, melena, hematochezia, dysuria, hematuria, rash, seizure activity, orthopnea, PND, pedal edema, claudication. Remaining systems are negative.  Physical Exam: Well-developed well-nourished in no acute distress.  Skin is warm and dry.  HEENT is normal.  Neck is supple.  Chest is clear to  auscultation with normal expansion.  Cardiovascular exam is regular rate and rhythm.  Abdominal exam nontender or distended. No masses palpated. Extremities show no edema. neuro grossly intact  A/P  1 chronic combined systolic/diastolic congestive heart failure-LV function low normal on most recent echocardiogram.  He remains euvolemic on examination.  Continue diuretic at present dose.  Previously declined SGLT2 inhibitor.  2 history of cardiomyopathy-LV function low normal on most recent echocardiogram.  Continue beta-blocker and ARB.  3 hypertension-patient's blood pressure is elevated; however he states typically controlled.  Continue present medications and follow-up.  4 paroxysmal atrial fibrillation-continue beta-blocker for rate control if atrial fibrillation recurs.  He has had hematochezia previously and now declines anticoagulation understanding the  high risk of CVA.  4 history of pacemaker-generator recently changed.  Managed by electrophysiology.  6 hyperlipidemia-Per primary care.  Alexandria Angel, MD

## 2023-10-14 ENCOUNTER — Ambulatory Visit: Attending: Cardiology | Admitting: Cardiology

## 2023-10-14 ENCOUNTER — Encounter: Payer: Self-pay | Admitting: Cardiology

## 2023-10-14 VITALS — BP 162/66 | HR 60 | Ht 70.0 in | Wt 139.6 lb

## 2023-10-14 DIAGNOSIS — I10 Essential (primary) hypertension: Secondary | ICD-10-CM

## 2023-10-14 DIAGNOSIS — I480 Paroxysmal atrial fibrillation: Secondary | ICD-10-CM | POA: Diagnosis not present

## 2023-10-14 DIAGNOSIS — Z950 Presence of cardiac pacemaker: Secondary | ICD-10-CM

## 2023-10-14 DIAGNOSIS — I420 Dilated cardiomyopathy: Secondary | ICD-10-CM

## 2023-10-14 NOTE — Patient Instructions (Signed)
  Follow-Up: At [facility], you and your health needs are our priority.  As part of our continuing mission to provide you with exceptional heart care, our providers are all part of one team.  This team includes your primary Cardiologist (physician) and Advanced Practice Providers or APPs (Physician Assistants and Nurse Practitioners) who all work together to provide you with the care you need, when you need it.  Your next appointment:   12 month(s)  Provider:   Alexandria Angel MD

## 2023-10-15 NOTE — Progress Notes (Signed)
 Remote pacemaker transmission.

## 2023-10-30 ENCOUNTER — Ambulatory Visit: Payer: Self-pay | Admitting: Cardiology

## 2023-10-30 ENCOUNTER — Ambulatory Visit (INDEPENDENT_AMBULATORY_CARE_PROVIDER_SITE_OTHER)

## 2023-10-30 DIAGNOSIS — I442 Atrioventricular block, complete: Secondary | ICD-10-CM | POA: Diagnosis not present

## 2023-10-30 LAB — CUP PACEART REMOTE DEVICE CHECK
Battery Remaining Longevity: 110 mo
Battery Remaining Percentage: 95.5 %
Battery Voltage: 3.02 V
Brady Statistic AP VP Percent: 37 %
Brady Statistic AP VS Percent: 1 %
Brady Statistic AS VP Percent: 63 %
Brady Statistic AS VS Percent: 1 %
Brady Statistic RA Percent Paced: 37 %
Brady Statistic RV Percent Paced: 99 %
Date Time Interrogation Session: 20250522020019
Implantable Lead Connection Status: 753985
Implantable Lead Connection Status: 753985
Implantable Lead Implant Date: 20051123
Implantable Lead Implant Date: 20051123
Implantable Lead Location: 753859
Implantable Lead Location: 753860
Implantable Pulse Generator Implant Date: 20250411
Lead Channel Impedance Value: 460 Ohm
Lead Channel Impedance Value: 480 Ohm
Lead Channel Pacing Threshold Amplitude: 0.75 V
Lead Channel Pacing Threshold Amplitude: 1.75 V
Lead Channel Pacing Threshold Pulse Width: 0.5 ms
Lead Channel Pacing Threshold Pulse Width: 0.5 ms
Lead Channel Sensing Intrinsic Amplitude: 4.6 mV
Lead Channel Sensing Intrinsic Amplitude: 9 mV
Lead Channel Setting Pacing Amplitude: 2 V
Lead Channel Setting Pacing Amplitude: 2 V
Lead Channel Setting Pacing Pulse Width: 0.5 ms
Lead Channel Setting Sensing Sensitivity: 5 mV
Pulse Gen Model: 2272
Pulse Gen Serial Number: 8256415

## 2023-11-06 ENCOUNTER — Encounter

## 2023-11-20 ENCOUNTER — Encounter: Payer: Self-pay | Admitting: Family Medicine

## 2023-11-25 ENCOUNTER — Encounter: Payer: Self-pay | Admitting: Gastroenterology

## 2023-11-26 ENCOUNTER — Ambulatory Visit: Admitting: Physician Assistant

## 2023-12-02 ENCOUNTER — Ambulatory Visit: Admitting: Gastroenterology

## 2023-12-02 ENCOUNTER — Encounter: Payer: Self-pay | Admitting: Gastroenterology

## 2023-12-02 VITALS — BP 130/62 | HR 63 | Ht 70.0 in | Wt 137.0 lb

## 2023-12-02 DIAGNOSIS — R1310 Dysphagia, unspecified: Secondary | ICD-10-CM | POA: Diagnosis not present

## 2023-12-02 DIAGNOSIS — R0989 Other specified symptoms and signs involving the circulatory and respiratory systems: Secondary | ICD-10-CM | POA: Diagnosis not present

## 2023-12-02 DIAGNOSIS — R1319 Other dysphagia: Secondary | ICD-10-CM

## 2023-12-02 DIAGNOSIS — R09A2 Foreign body sensation, throat: Secondary | ICD-10-CM

## 2023-12-02 NOTE — Progress Notes (Signed)
 Chief Complaint: follow-up dysphagia Primary GI Doctor:Dr. San  HPI: Donald Patel is a 83 y.o. 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  Last seen in GI clinic by Dr. San on 04/02/23 for dysphagia.  Initially seen in the GI clinic on 09/08/2020 for evaluation of fecal seepage which started after hemorrhoid banding at outside facility in 2013.  No nocturnal leakage.  No urge incontinence. - Colonoscopy (09/2020): Tattoo with adjacent scar and subtle nodularity in proximal ascending colon (biopsied: Benign), 3 subcentimeter tubular adenomas, sigmoid diverticulosis, internal hemorrhoids with scars from prior banding.  Recommend repeat in 3 years for continued surveillance     - 02/13/2021: Evaluation in the GI clinic for dysphagia with pills, pointing to suprasternal notch.  Will occasionally regurgitate pill back out.  Weight stable. Only occurs when pill is >200 mg. - 02/22/2021: Esophagram: piecemeal swallowing with laryngeal penetration to the cords and a prominent cricopharyngeus muscle.  Small type I hiatal hernia and a 1.9 cm nonobstructing Schatzki's ring.  Did have some apprehension about swallowing the barium tablet, but once he did, tablet passed without issue into the stomach.  Referred to speech pathology --04/30/2023 MBSS- FINDINGS: Vestibular  Penetration:  Seen with thin and nectar thick liquids. Aspiration:  Silent aspiration with thin liquid. Other:  Retention with all tested modalities.    Endoscopic History: -Colonoscopy (03/2012, [facility]): Diminutive cecal polyp (SSP), 1.5 cm ascending colon polyp resected piecemeal and tattooed (TA), diminutive transverse polyp (TA), medium-sized internal/external hemorrhoids, sigmoid diverticulosis.  Recommend repeat in 6 months -Hemorrhoid banding at 3 separate  sessions in 05/2012 -Colonoscopy (07/2012, Dr. Jefrey  at [facility]): 7-8 mm polyp at hepatic flexure, cecal polyp, prior polypectomy site in ascending colon biopsy, Scar from prior hemorrhoid banding and distal rectal, sigmoid diverticulosis.  No path review.  Repeat in 3 years -Colonoscopy (11/2015, Dr. Jefrey at [facility]): 5 mm tubular adenoma in ascending colon, no residual polyp at prior descending colon polypectomy site, pandiverticulosis, external hemorrhoids.  Scar from prior hemorrhoid banding in distal rectum.  Recommended repeat in 5 years - Colonoscopy (09/2020): Tattoo with adjacent scar and subtle nodularity in proximal ascending colon (biopsied: Benign), 3 subcentimeter tubular adenomas, sigmoid diverticulosis, internal hemorrhoids with scars from prior banding.  Recommend repeat in 3-5 years for continued surveillance --EGD (04/11/2023) Dr. San for dysphagia- - Normal oropharynx. - Severe narrowing at the cricopharyngeus, with possible diverticulum. This was not traversable. - No specimens collected.  10/14/23 cardiology follow-up.  Had pacemaker generator change April 2025.  Since last seen he denies dyspnea, chest pain, palpitations or syncope. Reviewed note.   Interval History   Patient presents for follow-up evaluation of dysphagia,increased throat clearing, and changes in voice.  He wanted to discuss and schedule repeat endoscopy that was recommended in November. It was put off due to pacemaker being changed and everything is settled now. Pacemaker replaced on 09/19/2023.      Patient reports his symptoms are increased in severity and frequency over the last few months. He has lost a few lbs but states it is not due to the dysphagia.     Patient takes baby ASA 81mg  po daily.   Wt Readings from Last 3 Encounters:  12/02/23 137 lb (62.1 kg)  10/14/23 139 lb 9.6 oz (63.3 kg)  09/19/23 140 lb (63.5 kg)    Past Medical History:  Diagnosis Date    AV BLOCK, COMPLETE    s/p PPM (SJM)   Cataract both eyes corrected   Chronic right-sided low back pain with right-sided sciatica 11/09/2019   Emphysema lung (HCC) 11/09/2020   ERECTILE DYSFUNCTION, ORGANIC    History of colon polyps    Hypertension    Hypertension     Past Surgical History:  Procedure Laterality Date   CATARACT EXTRACTION W/ INTRAOCULAR LENS IMPLANT  2013   right eye - [facility]   CATARACT EXTRACTION W/ INTRAOCULAR LENS IMPLANT  2015   Left eye - [facility]   COLONOSCOPY  11/29/2015   [facility]   KNEE SURGERY     PACEMAKER PLACEMENT  12/22/96   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, most recent generator (SJM) 05/02/04 both implanted by Dr Delena [facility]   PERMANENT PACEMAKER GENERATOR CHANGE N/A 07/29/2013   Procedure: PERMANENT PACEMAKER GENERATOR CHANGE;  Surgeon: Lynwood JONETTA Rakers, MD;  Location: [facility] CATH LAB;  Service: Cardiovascular;  Laterality: N/A;   PPM GENERATOR CHANGEOUT N/A 09/19/2023   Procedure: PPM GENERATOR CHANGEOUT;  Surgeon: Inocencio Soyla Lunger, MD;  Location: [facility] INVASIVE CV LAB;  Service: Cardiovascular;  Laterality: N/A;   VASECTOMY      Current Outpatient Medications  Medication Sig Dispense Refill   acetaminophen  (TYLENOL ) 160 MG/5ML suspension Take 320 mg by mouth every 6 (six) hours as needed for moderate pain (pain score 4-6) or mild pain (pain score 1-3).     aspirin  EC 81 MG tablet Take 1 tablet (81 mg total) by mouth daily. Swallow whole. (Patient taking differently: Take 81 mg by mouth every other day. Swallow whole.) 90 tablet 3   Cholecalciferol (VITAMIN D3) LIQD Take 4 drops by mouth daily. In Water 1000 mcg     hydrochlorothiazide  (HYDRODIURIL ) 25 MG tablet TAKE 1 TABLET(25 MG) BY MOUTH DAILY 90 tablet 3   ibuprofen (ADVIL) 200 MG tablet Take 200 mg by mouth daily as needed for mild pain (pain score 1-3) or moderate pain (pain score 4-6).     ipratropium (ATROVENT) 0.06 % nasal spray Place 2 sprays into both nostrils 2 (two)  times daily.     losartan  (COZAAR ) 25 MG tablet TAKE 1 TABLET(25 MG) BY MOUTH DAILY 90 tablet 2   metoprolol  succinate (TOPROL -XL) 25 MG 24 hr tablet Take 1 tablet (25 mg total) by mouth daily. 90 tablet 3   Multiple Vitamins-Minerals (PRESERVISION AREDS 2) CHEW Chew 1 tablet by mouth daily.     No current facility-administered medications for this visit.    Allergies as of 12/02/2023 - Review Complete 12/02/2023  Allergen Reaction Noted   Anoro ellipta  [umeclidinium-vilanterol] Other (See Comments) 11/13/2021   Lisinopril  Other (See Comments) 04/14/2019    Family History  Problem Relation Age of Onset   Breast cancer Mother    Heart attack Brother    Diabetes Other    Hodgkin's lymphoma Brother    Colon cancer Neg Hx    Esophageal cancer Neg Hx    Rectal cancer Neg Hx    Stomach cancer Neg Hx     Review of Systems:    Constitutional: No weight loss, fever, chills, weakness or fatigue HEENT: Eyes: No change in vision               Ears, Nose, Throat:  No change in hearing or congestion Skin: No rash or itching Cardiovascular: No chest pain, chest pressure or palpitations   Respiratory: No SOB or cough Gastrointestinal: See HPI  and otherwise negative Genitourinary: No dysuria or change in urinary frequency Neurological: No headache, dizziness or syncope Musculoskeletal: No new muscle or joint pain Hematologic: No bleeding or bruising Psychiatric: No history of depression or anxiety    Physical Exam:  Vital signs: BP 130/62   Pulse 63   Ht 5' 10 (1.778 m)   Wt 137 lb (62.1 kg)   BMI 19.66 kg/m   Constitutional:   Pleasant male appears to be in NAD, Well developed, Well nourished, alert and cooperative Throat: Oral cavity and pharynx without inflammation, swelling or lesion.  Respiratory: Respirations even and unlabored. Lungs clear to auscultation bilaterally.   No wheezes, crackles, or rhonchi.  Cardiovascular: Normal S1, S2. Regular rate and rhythm. No peripheral  edema, cyanosis or pallor.  Gastrointestinal:  Soft, nondistended, nontender. No rebound or guarding. Normal bowel sounds. No appreciable masses or hepatomegaly. Rectal:  Not performed.  Msk:  Symmetrical without gross deformities. Without edema, no deformity or joint abnormality.  Neurologic:  Alert and  oriented x4;  grossly normal neurologically.  Skin:   Dry and intact without significant lesions or rashes. Psychiatric: Oriented to person, place and time. Demonstrates good judgement and reason without abnormal affect or behaviors.  RELEVANT LABS AND IMAGING: CBC    Latest Ref Rng & Units 08/28/2023    3:05 PM 12/05/2022    8:29 AM 11/09/2020   12:00 AM  CBC  WBC 3.4 - 10.8 x10E3/uL 9.2  6.3  6.2   Hemoglobin 13.0 - 17.7 g/dL 84.3  84.6  83.5   Hematocrit 37.5 - 51.0 % 44.6  44.7  47.6   Platelets 150 - 450 x10E3/uL 213  185  198      CMP     Latest Ref Rng & Units 08/28/2023    3:05 PM 06/05/2023   12:02 PM 12/05/2022    8:29 AM  CMP  Glucose 70 - 99 mg/dL 96  86  99   BUN 8 - 27 mg/dL 19  17  17    Creatinine 0.76 - 1.27 mg/dL 8.79  8.92  8.97   Sodium 134 - 144 mmol/L 135  131  137   Potassium 3.5 - 5.2 mmol/L 4.0  3.9  3.5   Chloride 96 - 106 mmol/L 94  90  96   CO2 20 - 29 mmol/L 25  25  31    Calcium 8.6 - 10.2 mg/dL 89.9  9.6  9.9   Total Protein 6.0 - 8.5 g/dL  7.1  6.7   Total Bilirubin 0.0 - 1.2 mg/dL  0.8  1.1   Alkaline Phos 44 - 121 IU/L  88    AST 0 - 40 IU/L  19  13   ALT 0 - 44 IU/L  13  12      Lab Results  Component Value Date   TSH 1.40 04/13/2019   07/05/22 echo- Left ventricular ejection fraction, by estimation, is 50 to 55%.   Assessment: Encounter Diagnoses  Name Primary?   Esophageal dysphagia Yes   Throat clearing    Globus sensation      83 year old male patient who presents with worsening dyshagia, hoarseness, globus sensation. 04/2023 EGD with severe narrowing at the cricopharyngeus, with possible diverticulum. This was not traversable. Dr.  San wanted to repeat upper endoscopy at [facility] with ultraslim scope and fluoroscopy once pacemaker battery exchanged. Pacemaker battery change April 2025. Will proceed with procedures.  Plan: - Offered trial of high-dose PPI for diagnostic  and therapeutic intent.  He politely declines and wants to avoid medications unless absolutely necessary - Discussed dysphagia precautions.  Continue cutting food into small pieces, chewing thoroughly, plenty of fluids with meals and medications - Discussed risks, benefits, alternatives of EGD at length today --Schedule with Dr. San, repeat upper endoscopy at [facility] with ultraslim scope and fluoroscopy available at the next available appointment.The risks and benefits of EGD with possible biopsies and esophageal dilation were discussed with the patient who agrees to proceed.   Thank you for the courtesy of this consult. Please call me with any questions or concerns.   Finlay Godbee, FNP-C [facility] 12/02/2023, 1:55 PM  Cc: Alvan Dorothyann BIRCH, *

## 2023-12-02 NOTE — Patient Instructions (Signed)
 You have been scheduled for an endoscopy. Please follow written instructions given to you at your visit today.  If you use inhalers (even only as needed), please bring them with you on the day of your procedure.  If you take any of the following medications, they will need to be adjusted prior to your procedure:   DO NOT TAKE 7 DAYS PRIOR TO TEST- Trulicity (dulaglutide) Ozempic, Wegovy (semaglutide) Mounjaro (tirzepatide) Bydureon Bcise (exanatide extended release)  DO NOT TAKE 1 DAY PRIOR TO YOUR TEST Rybelsus (semaglutide) Adlyxin (lixisenatide) Victoza (liraglutide) Byetta (exanatide) _________________________________________________________________________  Due to recent changes in healthcare laws, you may see the results of your imaging and laboratory studies on MyChart before your provider has had a chance to review them.  We understand that in some cases there may be results that are confusing or concerning to you. Not all laboratory results come back in the same time frame and the provider may be waiting for multiple results in order to interpret others.  Please give us  48 hours in order for your provider to thoroughly review all the results before contacting the office for clarification of your results.   _______________________________________________________  If your blood pressure at your visit was 140/90 or greater, please contact your primary care physician to follow up on this.  _______________________________________________________  If you are age 32 or older, your body mass index should be between 23-30. Your Body mass index is 19.66 kg/m. If this is out of the aforementioned range listed, please consider follow up with your Primary Care Provider.  If you are age 32 or younger, your body mass index should be between 19-25. Your Body mass index is 19.66 kg/m. If this is out of the aformentioned range listed, please consider follow up with your Primary Care Provider.    ________________________________________________________  The [facility] providers would like to encourage you to use MYCHART to communicate with providers for non-urgent requests or questions.  Due to long hold times on the telephone, sending your provider a message by [facility] may be a faster and more efficient way to get a response.  Please allow 48 business hours for a response.  Please remember that this is for non-urgent requests.  _______________________________________________________   Thank you for trusting me with your gastrointestinal care. Deanna May, RNP

## 2023-12-04 ENCOUNTER — Ambulatory Visit (INDEPENDENT_AMBULATORY_CARE_PROVIDER_SITE_OTHER): Payer: Medicare Other | Admitting: Family Medicine

## 2023-12-04 ENCOUNTER — Encounter: Payer: Self-pay | Admitting: Family Medicine

## 2023-12-04 VITALS — BP 132/74 | HR 59 | Ht 70.0 in | Wt 137.1 lb

## 2023-12-04 DIAGNOSIS — I442 Atrioventricular block, complete: Secondary | ICD-10-CM

## 2023-12-04 DIAGNOSIS — J439 Emphysema, unspecified: Secondary | ICD-10-CM | POA: Diagnosis not present

## 2023-12-04 DIAGNOSIS — I5042 Chronic combined systolic (congestive) and diastolic (congestive) heart failure: Secondary | ICD-10-CM | POA: Diagnosis not present

## 2023-12-04 DIAGNOSIS — Z950 Presence of cardiac pacemaker: Secondary | ICD-10-CM

## 2023-12-04 DIAGNOSIS — I10 Essential (primary) hypertension: Secondary | ICD-10-CM

## 2023-12-04 DIAGNOSIS — R1314 Dysphagia, pharyngoesophageal phase: Secondary | ICD-10-CM | POA: Diagnosis not present

## 2023-12-04 NOTE — Assessment & Plan Note (Signed)
 Did have his pacemaker replaced this past year and is actually doing well.

## 2023-12-04 NOTE — Assessment & Plan Note (Signed)
 Blood pressure looks fantastic he typically gets average home blood pressures in the 130s and also at the doctor's office recently.

## 2023-12-04 NOTE — Progress Notes (Signed)
   Established Patient Office Visit  Subjective  Patient ID: Donald Patel, male    DOB: 09/15/1940  Age: 83 y.o. MRN: [id number]  Chief Complaint  Patient presents with   Hypertension    HPI Hypertension- Pt denies chest pain, SOB, dizziness, or heart palpitations.  Taking meds as directed w/o problems.  Denies medication side effects.     He has an EGD scheduled with Dr. San in July for dysphagia.    ROS    Objective:     BP 132/74   Pulse (!) 59   Ht 5' 10 (1.778 m)   Wt 137 lb 1.9 oz (62.2 kg)   SpO2 100%   BMI 19.67 kg/m    Physical Exam Vitals and nursing note reviewed.  Constitutional:      Appearance: Normal appearance.  HENT:     Head: Normocephalic and atraumatic.   Eyes:     Conjunctiva/sclera: Conjunctivae normal.    Cardiovascular:     Rate and Rhythm: Normal rate and regular rhythm.  Pulmonary:     Effort: Pulmonary effort is normal.     Breath sounds: Normal breath sounds.   Skin:    General: Skin is warm and dry.   Neurological:     Mental Status: He is alert.   Psychiatric:        Mood and Affect: Mood normal.      No results found for any visits on 12/04/23.    The ASCVD Risk score (Arnett DK, et al., 2019) failed to calculate for the following reasons:   The 2019 ASCVD risk score is only valid for ages 53 to 3    Assessment & Plan:   Problem List Items Addressed This Visit       Cardiovascular and Mediastinum   Essential hypertension, benign - Primary   Blood pressure looks fantastic he typically gets average home blood pressures in the 130s and also at the doctor's office recently.      Relevant Orders   CMP14+EGFR   Lipid panel   CBC   Chronic combined systolic (congestive) and diastolic (congestive) heart failure (HCC)   AV BLOCK, COMPLETE   Had pacemaker replaced this year after 9 years and doing well.  He is on a low-dose of metoprolol  as well.        Respiratory   Emphysema lung (HCC)   Says  he has not really had any shortness of breath recently he has not felt like he was getting fatigued easily with activity he feels like he is actually doing really well.        Digestive   Swallowing difficulty   Scheduled for an EGD next month.  He is hoping that that will help also encouraged him to let me know if there is any pills or medications that we might be able to alter that he can swallow more easily since he does occasionally feel like they get stuck.        Other   PACEMAKER-[employer]   Did have his pacemaker replaced this past year and is actually doing well.       No follow-ups on file.    [name], MD

## 2023-12-04 NOTE — Assessment & Plan Note (Signed)
 Had pacemaker replaced this year after 9 years and doing well.  He is on a low-dose of metoprolol  as well.

## 2023-12-04 NOTE — Assessment & Plan Note (Signed)
 Says he has not really had any shortness of breath recently he has not felt like he was getting fatigued easily with activity he feels like he is actually doing really well.

## 2023-12-04 NOTE — Assessment & Plan Note (Signed)
 Scheduled for an EGD next month.  He is hoping that that will help also encouraged him to let me know if there is any pills or medications that we might be able to alter that he can swallow more easily since he does occasionally feel like they get stuck.

## 2023-12-05 ENCOUNTER — Ambulatory Visit: Payer: Self-pay | Admitting: Family Medicine

## 2023-12-05 LAB — CMP14+EGFR
ALT: 13 IU/L (ref 0–44)
AST: 17 IU/L (ref 0–40)
Albumin: 4.4 g/dL (ref 3.7–4.7)
Alkaline Phosphatase: 83 IU/L (ref 44–121)
BUN/Creatinine Ratio: 13 (ref 10–24)
BUN: 13 mg/dL (ref 8–27)
Bilirubin Total: 0.9 mg/dL (ref 0.0–1.2)
CO2: 24 mmol/L (ref 20–29)
Calcium: 9.6 mg/dL (ref 8.6–10.2)
Chloride: 92 mmol/L — ABNORMAL LOW (ref 96–106)
Creatinine, Ser: 1.01 mg/dL (ref 0.76–1.27)
Globulin, Total: 3 g/dL (ref 1.5–4.5)
Glucose: 93 mg/dL (ref 70–99)
Potassium: 4 mmol/L (ref 3.5–5.2)
Sodium: 133 mmol/L — ABNORMAL LOW (ref 134–144)
Total Protein: 7.4 g/dL (ref 6.0–8.5)
eGFR: 74 mL/min/{1.73_m2} (ref >59)

## 2023-12-05 LAB — CBC
Hematocrit: 45.4 % (ref 37.5–51.0)
Hemoglobin: 15.7 g/dL (ref 13.0–17.7)
MCH: 33.1 pg — ABNORMAL HIGH (ref 26.6–33.0)
MCHC: 34.6 g/dL (ref 31.5–35.7)
MCV: 96 fL (ref 79–97)
Platelets: 183 10*3/uL (ref 150–450)
RBC: 4.75 x10E6/uL (ref 4.14–5.80)
RDW: 12.4 % (ref 11.6–15.4)
WBC: 5.9 10*3/uL (ref 3.4–10.8)

## 2023-12-05 LAB — LIPID PANEL
Chol/HDL Ratio: 2.7 ratio (ref 0.0–5.0)
Cholesterol, Total: 199 mg/dL (ref 100–199)
HDL: 73 mg/dL (ref >39)
LDL Chol Calc (NIH): 111 mg/dL — ABNORMAL HIGH (ref 0–99)
Triglycerides: 85 mg/dL (ref 0–149)
VLDL Cholesterol Cal: 15 mg/dL (ref 5–40)

## 2023-12-05 NOTE — Progress Notes (Signed)
 Hi Donald Patel, sodium was low again similar to 6 months ago.  Chloride was also a little low.  How much water you do you drink on a daily basis?  Do you drink a lot or a little?  Sometimes too much water can actually lower sodium so just wanted to check.  Sometimes certain medications can lower your sodium levels as well.  Liver enzymes are normal.  LDL is up a little bit.  Triglycerides look good.  Blood count is okay no sign of anemia or infection.

## 2023-12-08 ENCOUNTER — Encounter

## 2023-12-08 NOTE — Progress Notes (Signed)
 Can you cut your hydrochlorothiazide  pill in half and take half  tab daily and then recheck your sodium in about 2-3 weeks after the medication adjustment.

## 2023-12-15 ENCOUNTER — Telehealth: Payer: Self-pay

## 2023-12-15 ENCOUNTER — Encounter (HOSPITAL_COMMUNITY): Payer: Self-pay | Admitting: Gastroenterology

## 2023-12-15 NOTE — Telephone Encounter (Signed)
 Procedure:EGD Procedure date: 12/22/23 Procedure location: [facility] Arrival Time: 6:45 am Spoke with the patient Y/N: y Any prep concerns? n  Has the patient obtained the prep from the pharmacy ? n Do you have a care partner and transportation: y Any additional concerns? n

## 2023-12-17 NOTE — Progress Notes (Signed)
 Remote pacemaker transmission.

## 2023-12-19 ENCOUNTER — Ambulatory Visit: Attending: Pulmonary Disease | Admitting: Pulmonary Disease

## 2023-12-19 ENCOUNTER — Encounter: Payer: Self-pay | Admitting: Pulmonary Disease

## 2023-12-19 VITALS — BP 138/78 | HR 60 | Ht 70.0 in | Wt 141.2 lb

## 2023-12-19 DIAGNOSIS — I10 Essential (primary) hypertension: Secondary | ICD-10-CM

## 2023-12-19 DIAGNOSIS — Z950 Presence of cardiac pacemaker: Secondary | ICD-10-CM

## 2023-12-19 DIAGNOSIS — I480 Paroxysmal atrial fibrillation: Secondary | ICD-10-CM

## 2023-12-19 DIAGNOSIS — I5042 Chronic combined systolic (congestive) and diastolic (congestive) heart failure: Secondary | ICD-10-CM | POA: Diagnosis not present

## 2023-12-19 LAB — CUP PACEART INCLINIC DEVICE CHECK
Battery Remaining Longevity: 111 mo
Battery Voltage: 3.01 V
Brady Statistic RA Percent Paced: 49 %
Brady Statistic RV Percent Paced: 99.92 %
Date Time Interrogation Session: 20250711165339
Implantable Lead Connection Status: 753985
Implantable Lead Connection Status: 753985
Implantable Lead Implant Date: 20051123
Implantable Lead Implant Date: 20051123
Implantable Lead Location: 753859
Implantable Lead Location: 753860
Implantable Pulse Generator Implant Date: 20250411
Lead Channel Impedance Value: 425 Ohm
Lead Channel Impedance Value: 462.5 Ohm
Lead Channel Pacing Threshold Amplitude: 0.5 V
Lead Channel Pacing Threshold Amplitude: 0.5 V
Lead Channel Pacing Threshold Amplitude: 1.375 V
Lead Channel Pacing Threshold Pulse Width: 0.5 ms
Lead Channel Pacing Threshold Pulse Width: 0.5 ms
Lead Channel Pacing Threshold Pulse Width: 0.5 ms
Lead Channel Sensing Intrinsic Amplitude: 3.6 mV
Lead Channel Setting Pacing Amplitude: 1.625
Lead Channel Setting Pacing Amplitude: 2 V
Lead Channel Setting Pacing Pulse Width: 0.5 ms
Lead Channel Setting Sensing Sensitivity: 5 mV
Pulse Gen Model: 2272
Pulse Gen Serial Number: 8256415

## 2023-12-19 NOTE — Progress Notes (Signed)
  Electrophysiology Office Note:   Date:  12/19/2023  ID:  Donald Patel, DOB March 28, 1941, MRN [id number]  Primary Cardiologist: None Primary Heart Failure: None Electrophysiologist: Will Gladis Norton, MD       History of Present Illness:   Donald Patel is a 83 y.o. male with h/o CHB s/p PPM, HFrEF / NICM, HTN, HLD, AF (low burden, not anticoagulated) seen today for routine electrophysiology follow-up s/p pacemaker generator change.   He underwent generator change on 09/19/23. Prior known significant scar associated with PPM site.   Since last being seen in our clinic the patient reports doing well post generator change. His transmitter light bothers him at night at times - he covers with a post it note. No problems with healing post generator change.     He denies chest pain, palpitations, dyspnea, PND, orthopnea, nausea, vomiting, dizziness, syncope, edema, weight gain, or early satiety.    Review of systems complete and found to be negative unless listed in HPI.   EP Information / Studies Reviewed:    EKG is not ordered today. EKG from 03/26/23 reviewed which showed AV dual-paced rhythm, 60 bpm      PPM Interrogation-  reviewed in detail today,  See PACEART report.  Device History: Abbott Dual Chamber PPM implanted 05/02/2004 for CHB Generator Change > 07/29/13, 09/19/23  No R Waves at 40 bpm 08/28/23   Studies:  ECHO 11/2020 > LVEF 40-45% Coronary CTA 01/2021 > CCS 0, NICM     Arrhythmia / AAD Paroxysmal Atrial Fibrillation                  Physical Exam:   VS:  BP 138/78   Pulse 60   Ht 5' 10 (1.778 m)   Wt 141 lb 3.2 oz (64 kg)   SpO2 98%   BMI 20.26 kg/m    Wt Readings from Last 3 Encounters:  12/19/23 141 lb 3.2 oz (64 kg)  12/04/23 137 lb 1.9 oz (62.2 kg)  12/02/23 137 lb (62.1 kg)     GEN: Well nourished, well developed in no acute distress NECK: No JVD; No carotid bruits CARDIAC: Regular rate and rhythm, no murmurs, rubs, gallops RESPIRATORY:   Clear to auscultation without rales, wheezing or rhonchi  ABDOMEN: Soft, non-tender, non-distended EXTREMITIES:  No edema; No deformity   ASSESSMENT AND PLAN:    CHB s/p Abbott PPM  Dependent -Normal PPM function -See Pace Art report -No changes today -few AMS episodes are brief (seconds long) AT  Paroxysmal Atrial Fibrillation  -historically low burden / not on OAC -0% burden on device   HFrEF / NICM  CCS 0 in 2022  -euvolemic on exam  -GDMT per Cardiology   Hypertension  -well controlled on current regimen     Disposition:   Follow up with Dr. Norton or EP APP in 12 months  Signed, Daphne Barrack, NP-C, AGACNP-BC [facility] - Electrophysiology  12/19/2023, 1:55 PM

## 2023-12-19 NOTE — Patient Instructions (Signed)
 Medication Instructions:  Your physician recommends that you continue on your current medications as directed. Please refer to the Current Medication list given to you today.  *If you need a refill on your cardiac medications before your next appointment, please call your pharmacy*  Lab Work: None ordered If you have labs (blood work) drawn today and your tests are completely normal, you will receive your results only by: MyChart Message (if you have MyChart) OR A paper copy in the mail If you have any lab test that is abnormal or we need to change your treatment, we will call you to review the results.  Follow-Up: At [facility], you and your health needs are our priority.  As part of our continuing mission to provide you with exceptional heart care, our providers are all part of one team.  This team includes your primary Cardiologist (physician) and Advanced Practice Providers or APPs (Physician Assistants and Nurse Practitioners) who all work together to provide you with the care you need, when you need it.  Your next appointment:   1 year(s)  Provider:   You may see Will Cortland Ding, MD or one of the following Advanced Practice Providers on your designated Care Team:   Mertha Abrahams, PA-C Michael Andy Tillery, PA-C Suzann Riddle, NP Creighton Doffing, NP

## 2023-12-21 ENCOUNTER — Encounter: Payer: Self-pay | Admitting: Gastroenterology

## 2023-12-21 ENCOUNTER — Other Ambulatory Visit: Payer: Self-pay

## 2023-12-21 ENCOUNTER — Ambulatory Visit
Admission: EM | Admit: 2023-12-21 | Discharge: 2023-12-21 | Disposition: A | Discharge status: Home / Self Care | Attending: Family Medicine | Admitting: Family Medicine

## 2023-12-21 DIAGNOSIS — R21 Rash and other nonspecific skin eruption: Secondary | ICD-10-CM | POA: Diagnosis not present

## 2023-12-21 DIAGNOSIS — L509 Urticaria, unspecified: Secondary | ICD-10-CM | POA: Diagnosis not present

## 2023-12-21 MED ORDER — FEXOFENADINE HCL 180 MG PO TABS: 180.0000 mg | ORAL_TABLET | Freq: Every day | ORAL | 0 refills | Status: DC

## 2023-12-21 MED ORDER — METHYLPREDNISOLONE 4 MG PO TBPK: ORAL_TABLET | ORAL | 0 refills | Status: DC

## 2023-12-21 MED ORDER — METHYLPREDNISOLONE ACETATE 80 MG/ML IJ SUSP
80.0000 mg | Freq: Once | INTRAMUSCULAR | Status: AC
  Administered 2023-12-21: 80 mg via INTRAMUSCULAR

## 2023-12-21 NOTE — Discharge Instructions (Addendum)
 Advised patient take medications as directed with food completion.  Advised take Allegra  with prednisone for the next 5 days.  Encouraged to increase daily water intake to 64 ounces per day while taking these medications.  Advised if symptoms worsen and/or unresolved please follow-up with your PCP or here for further evaluation.  Advised if symptoms worsen and/or unresolved please follow-up with your PCP or here for further evaluation.

## 2023-12-21 NOTE — ED Provider Notes (Signed)
 Donald Patel CARE    CSN: [id number] Arrival date & time: 12/21/23  0911      History   Chief Complaint Chief Complaint  Patient presents with   Rash    HPI Donald Patel is a 83 y.o. male.   HPI 83 year old male presents with pruritic erythematous rash upon awakening this morning.  Patient reports wearing pajamas to bed that caused his rash.  PMH significant for complete AV block, cataract, and HTN.  Past Medical History:  Diagnosis Date   AV BLOCK, COMPLETE    s/p PPM (SJM)   Cataract both eyes corrected   Chronic right-sided low back pain with right-sided sciatica 11/09/2019   Emphysema lung (HCC) 11/09/2020   ERECTILE DYSFUNCTION, ORGANIC    History of colon polyps    Hypertension    Hypertension     Patient Active Problem List   Diagnosis Date Noted   Swallowing difficulty 06/05/2023   Chronic combined systolic (congestive) and diastolic (congestive) heart failure (HCC) 06/05/2023   Atypical chest pain 05/15/2021   Schatzki's ring 05/15/2021   Emphysema lung (HCC) 11/09/2020   Pulmonary nodule seen on imaging study 11/09/2020   Aortic atherosclerosis (HCC) 05/12/2020   Chronic right-sided low back pain with right-sided sciatica 11/09/2019   Knee locking, right 05/11/2019   Vitamin D  deficiency 04/14/2019   Back pain 01/08/2018   Stool incontinence 11/13/2016   Other and combined forms of senile cataract 07/01/2013   ERECTILE DYSFUNCTION, 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   AV BLOCK, COMPLETE 07/20/2008   Essential hypertension, benign 02/25/2008   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    Past Surgical History:  Procedure Laterality Date   CATARACT EXTRACTION W/ INTRAOCULAR LENS IMPLANT  2013   right eye - [facility]   CATARACT EXTRACTION W/ INTRAOCULAR LENS IMPLANT  2015   Left eye - [facility]   COLONOSCOPY  11/29/2015   [facility]   KNEE SURGERY     PACEMAKER PLACEMENT  12/22/96   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, most recent generator (SJM)  05/02/04 both implanted by Dr [facility]   PERMANENT PACEMAKER GENERATOR CHANGE N/A 07/29/2013   Procedure: PERMANENT PACEMAKER GENERATOR CHANGE;  Surgeon: Lynwood JONETTA Rakers, MD;  Location[facility] C[facility] LAB;  Service: Cardiovascular;  Laterality: N/A;   PPM GENERATOR CHANGEOUT N/A 09/19/2023   Procedure: PPM GENERATOR CHANGEOUT;  Surgeon: Inocencio Soyla Lunger, MD;  Location: [facility] INVASIVE CV LAB;  Service: Cardiovascular;  Laterality: N/A;   VASECTOMY         Home Medications    Prior to Admission medications   Medication Sig Start Date End Date Taking? Authorizing Provider  fexofenadine  (ALLEGRA  ALLERGY) 180 MG tablet Take 1 tablet (180 mg total) by mouth daily for 15 days. 12/21/23 01/05/24 Yes Teddy Sharper, FNP  methylPREDNISolone  (MEDROL  DOSEPAK) 4 MG TBPK tablet Take as directed. 12/21/23  Yes Teddy Sharper, FNP  acetaminophen  (TYLENOL ) 160 MG/5ML suspension Take 320 mg by mouth every 6 (six) hours as needed for moderate pain (pain score 4-6) or mild pain (pain score 1-3).    [provider]  aspirin  EC 81 MG tablet Take 1 tablet (81 mg total) by mouth daily. Swallow whole. 06/28/22   Pietro Redell RAMAN, MD  Cholecalciferol (VITAMIN D3) LIQD Take 4 drops by mouth daily. In Water 1000 mcg    [provider]  hydrochlorothiazide  (HYDRODIURIL ) 25 MG tablet TAKE 1 TABLET(25 MG) BY MOUTH DAILY Patient not taking: Reported on 12/19/2023 06/05/23   Pietro Redell RAMAN, MD  ibuprofen (ADVIL)  200 MG tablet Take 200 mg by mouth daily as needed for mild pain (pain score 1-3) or moderate pain (pain score 4-6).    [provider]  ipratropium (ATROVENT) 0.06 % nasal spray Place 2 sprays into both nostrils 2 (two) times daily. 08/21/23   [provider]  losartan  (COZAAR ) 25 MG tablet TAKE 1 TABLET(25 MG) BY MOUTH DAILY 07/18/23   Pietro Redell RAMAN, MD  metoprolol  succinate (TOPROL -XL) 25 MG 24 hr tablet Take 1 tablet (25 mg total) by mouth daily. 01/15/23   Pietro Redell RAMAN, MD   Multiple Vitamins-Minerals (PRESERVISION AREDS 2) CHEW Chew 1 tablet by mouth daily.    [provider]    Family History Family History  Problem Relation Age of Onset   Breast cancer Mother    Heart attack Brother    Diabetes Other    Hodgkin's lymphoma Brother    Colon cancer Neg Hx    Esophageal cancer Neg Hx    Rectal cancer Neg Hx    Stomach cancer Neg Hx     Social History Social History   Tobacco Use   Smoking status: Former    Current packs/day: 0.00    Average packs/day: 1.5 packs/day for 35.0 years (52.5 ttl pk-yrs)    Types: Cigarettes, Pipe, Cigars    Start date: 06/10/1950    Quit date: 06/10/1985    Years since quitting: 38.5   Smokeless tobacco: Never  Substance Use Topics   Alcohol use: Yes    Alcohol/week: 14.0 standard drinks of alcohol    Types: 14 Cans of beer per week   Drug use: No     Allergies   Anoro ellipta  [umeclidinium-vilanterol] and Lisinopril    Review of Systems Review of Systems  Skin:  Positive for rash.  All other systems reviewed and are negative.    Physical Exam Triage Vital Signs ED Triage Vitals  Encounter Vitals Group     BP 12/21/23 0927 (!) 179/82     Girls Systolic BP Percentile --      Girls Diastolic BP Percentile --      Boys Systolic BP Percentile --      Boys Diastolic BP Percentile --      Pulse Rate 12/21/23 0927 60     Resp 12/21/23 0927 18     Temp 12/21/23 0927 98.2 F (36.8 C)     Temp src --      SpO2 12/21/23 0927 98 %     Weight --      Height --      Head Circumference --      Peak Flow --      Pain Score 12/21/23 0926 0     Pain Loc --      Pain Education --      Exclude from Growth Chart --    No data found.  Updated Vital Signs BP (!) 179/82 (BP Location: Left Arm)   Pulse 60   Temp 98.2 F (36.8 C)   Resp 18   SpO2 98%    Physical Exam Vitals and nursing note reviewed.  Constitutional:      General: He is not in acute distress.    Appearance: Normal appearance. He  is normal weight. He is not ill-appearing, toxic-appearing or diaphoretic.  HENT:     Head: Normocephalic and atraumatic.     Mouth/Throat:     Mouth: Mucous membranes are moist.     Pharynx: Oropharynx is clear.  Eyes:  Extraocular Movements: Extraocular movements intact.     Conjunctiva/sclera: Conjunctivae normal.     Pupils: Pupils are equal, round, and reactive to light.  Cardiovascular:     Rate and Rhythm: Normal rate and regular rhythm.     Pulses: Normal pulses.     Heart sounds: Normal heart sounds.  Pulmonary:     Effort: Pulmonary effort is normal.     Breath sounds: Normal breath sounds. No wheezing, rhonchi or rales.  Musculoskeletal:        General: Normal range of motion.     Cervical back: Normal range of motion and neck supple.     Comments: Axilla/torso/groin: Pruritic erythematous maculopapular eruption with, demarcated annular borders please see images below  Skin:    General: Skin is warm and dry.  Neurological:     General: No focal deficit present.     Mental Status: He is alert and oriented to person, place, and time. Mental status is at baseline.  Psychiatric:        Mood and Affect: Mood normal.        Behavior: Behavior normal.      UC Treatments / Results  Labs (all labs ordered are listed, but only abnormal results are displayed) Labs Reviewed - No data to display  EKG   Radiology CUP [facility] DEVICE CHECK Result Date: 12/19/2023 Normal in-clinic dual chamber pacemaker check. Presenting Rhythm: ASAP / VP . Routine testing of thresholds, sensing, and impedance demonstrate stable parameters and no programming changes needed at this time. AMS episodes reviewed and brief ST/AT, no AF. Estimated longevity 9.3 years . Pt enrolled in remote follow-up. bo   Procedures Procedures (including critical care time)  Medications Ordered in UC Medications  methylPREDNISolone  acetate (DEPO-MEDROL ) injection 80 mg (80 mg Intramuscular Given  12/21/23 1009)    Initial Impression / Assessment and Plan / UC Course  I have reviewed the triage vital signs and the nursing notes.  Pertinent labs & imaging results that were available during my care of the patient were reviewed by me and considered in my medical decision making (see chart for details).     MDM: 1.  Rash and nonspecific skin eruption-IM Depo-Medrol  80 mg given once in clinic and prior to discharge, Rx'd Medrol  Dosepak: Take as directed; 2.  Urticaria-Rx'd Allegra  180 mg fexofenadine  daily x 5 days. Advised patient take medications as directed with food completion.  Advised take Allegra  with prednisone for the next 5 days.  Encouraged to increase daily water intake to 64 ounces per day while taking these medications.  Advised if symptoms worsen and/or unresolved please follow-up with your PCP or here for further evaluation.  Advised if symptoms worsen and/or unresolved please follow-up with your PCP or here for further evaluation. Final Clinical Impressions(s) / UC Diagnoses   Final diagnoses:  Rash and nonspecific skin eruption  Urticaria     Discharge Instructions      Advised patient take medications as directed with food completion.  Advised take Allegra  with prednisone for the next 5 days.  Encouraged to increase daily water intake to 64 ounces per day while taking these medications.  Advised if symptoms worsen and/or unresolved please follow-up with your PCP or here for further evaluation.  Advised if symptoms worsen and/or unresolved please follow-up with your PCP or here for further evaluation.     ED Prescriptions     Medication Sig Dispense Auth. Provider   fexofenadine  (ALLEGRA  ALLERGY) 180 MG tablet Take 1 tablet (  180 mg total) by mouth daily for 15 days. 15 tablet Kowen Kluth, FNP   methylPREDNISolone  (MEDROL  DOSEPAK) 4 MG TBPK tablet Take as directed. 1 each Teddy Sharper, FNP      PDMP not reviewed this encounter.   Teddy Sharper,  FNP 12/21/23 1011

## 2023-12-21 NOTE — ED Triage Notes (Addendum)
 Pt presents to uc with co rash to underarms. Right flank and groin that is red and itchy in nature since this morning.

## 2023-12-21 NOTE — Anesthesia Preprocedure Evaluation (Signed)
 Anesthesia Evaluation    Reviewed: Allergy & Precautions, Patient's Chart, lab work & pertinent test results  Airway        Dental   Pulmonary former smoker          Cardiovascular hypertension, +CHF (HFrEF)  + dysrhythmias (CHB) Atrial Fibrillation + pacemaker   NICM 06/2022 Echo  Left Ventricle: Left ventricular ejection fraction, by estimation, is 50  to 55%. Left ventricular ejection fraction by 3D volume is 53 %. The left  ventricle has low normal function. The left ventricle has no regional wall  motion abnormalities. The left  ventricular internal cavity size was normal in size. There is no left  ventricular hypertrophy. Abnormal (paradoxical) septal motion, consistent  with RV pacemaker. Left ventricular diastolic parameters are consistent  with Grade I diastolic dysfunction  (impaired relaxation).    Dual AV Paced R 60bpm     Neuro/Psych  negative psych ROS   GI/Hepatic   Endo/Other    Renal/GU      Musculoskeletal   Abdominal   Peds  Hematology   Anesthesia Other Findings All: Lisinopril   Reproductive/Obstetrics                              Anesthesia Physical Anesthesia Plan  ASA: 3  Anesthesia Plan: MAC   Post-op Pain Management: Minimal or no pain anticipated   Induction: Intravenous  PONV Risk Score and Plan: Propofol infusion and Treatment may vary due to age or medical condition  Airway Management Planned: Natural Airway, Nasal Cannula and Simple Face Mask  Additional Equipment: None  Intra-op Plan:   Post-operative Plan:   Informed Consent:      Dental advisory given  Plan Discussed with: CRNA and Surgeon  Anesthesia Plan Comments: (EGD for Dysphagia)        Anesthesia Quick Evaluation

## 2023-12-22 ENCOUNTER — Telehealth (HOSPITAL_COMMUNITY): Payer: Self-pay | Admitting: *Deleted

## 2023-12-22 ENCOUNTER — Encounter (HOSPITAL_COMMUNITY): Payer: Self-pay | Admitting: Anesthesiology

## 2023-12-22 ENCOUNTER — Ambulatory Visit: Payer: Self-pay | Admitting: Cardiology

## 2023-12-22 ENCOUNTER — Ambulatory Visit (HOSPITAL_COMMUNITY): Admission: RE | Admit: 2023-12-22 | Source: Home / Self Care | Admitting: Gastroenterology

## 2023-12-22 SURGERY — EGD (ESOPHAGOGASTRODUODENOSCOPY)
Anesthesia: Monitor Anesthesia Care

## 2023-12-22 NOTE — Telephone Encounter (Signed)
 Donald Patel was scheduled for 12/22/23 (Procedure) with Dr. San on 12/22/23 (date), at [facility]   Patient/or family called on 7/13 (date) to cancel their procedure due to left message on voice mail (reason)  Dr San & office notified. Patient instructed to call physician's office to reschedule their procedure. Patient demonstrated understanding.

## 2023-12-22 NOTE — Telephone Encounter (Signed)
 Noted, will await contact from patient to reschedule at his convenience.

## 2023-12-22 NOTE — Telephone Encounter (Signed)
 Noted, see 7/14 TE

## 2023-12-25 ENCOUNTER — Encounter: Payer: Self-pay | Admitting: Gastroenterology

## 2024-01-13 ENCOUNTER — Other Ambulatory Visit: Payer: Self-pay | Admitting: Cardiology

## 2024-01-21 ENCOUNTER — Encounter (HOSPITAL_COMMUNITY): Payer: Self-pay | Admitting: Gastroenterology

## 2024-01-21 ENCOUNTER — Other Ambulatory Visit: Payer: Self-pay

## 2024-01-21 ENCOUNTER — Encounter: Payer: Self-pay | Admitting: Cardiology

## 2024-01-21 NOTE — Progress Notes (Addendum)
 COVID Vaccine Completed:  Date of COVID positive in last 90 days:  PCP - [name], MD Cardiologist -   Electrophysiologist- Soyla Norton, MD  Chest x-ray - n/a EKG - 03/26/23 Epic Stress Test - over 10 years ago per pt ECHO - 07/05/22 Epic Cardiac Cath - n/a Pacemaker/ICD device last checked: 12/19/23 Epic, requested device orders via Epic Spinal Cord Stimulator: n/a  Bowel Prep - no   Sleep Study - n/a CPAP -   Fasting Blood Sugar - n/a Checks Blood Sugar _____ times a day  Last dose of GLP1 agonist-  N/A GLP1 instructions:  Do not take after     Last dose of SGLT-2 inhibitors-  N/A SGLT-2 instructions:  Do not take after     Blood Thinner Instructions:  Last dose:   Time: Aspirin  Instructions: ASA 81, no needto stop per pt Last Dose:  Activity level: Can go up a flight of stairs and perform activities of daily living without stopping and without symptoms of chest pain or shortness of breath.  Anesthesia review: HTN, AV block, CHF, aortic atherosclerosis, emphysema, pacemaker (device orders requested)  Sent Chart to [place], [place] to review  Patient denies shortness of breath, fever, cough and chest pain at PAT appointment  Patient verbalized understanding of instructions that were given to them at the PAT appointment. Patient was also instructed that they will need to review over the PAT instructions again at home before surgery.

## 2024-01-21 NOTE — Progress Notes (Signed)
 PERIOPERATIVE PRESCRIPTION FOR IMPLANTED CARDIAC DEVICE PROGRAMMING  Patient Information: Name:  Donald Patel  DOB:  1940-08-28  MRN:  [id number]  Planned Procedure:  EGD  Surgeon:  Dr. San  Date of Procedure:  02/02/24  Cautery will be used.  Position during surgery:    Device Information:  Clinic EP Physician:  Soyla Norton, MD   Device Type:  Pacemaker Manufacturer and Phone #:  [employer]. Jude/[employer]ott: [phone] Pacemaker Dependent?:  Yes.   Date of Last Device Check:  12/19/2023 Normal Device Function?:  Yes.    Electrophysiologist's Recommendations:  Have magnet available. Provide continuous ECG monitoring when magnet is used or reprogramming is to be performed.  Procedure may interfere with device function.  Magnet should be placed over device during procedure.  Per Device Clinic Standing Orders, Delon DELENA Sharps, RN  11:14 AM 01/21/2024

## 2024-01-26 ENCOUNTER — Telehealth: Payer: Self-pay | Admitting: Gastroenterology

## 2024-01-26 NOTE — Telephone Encounter (Addendum)
 Procedure:Endoscopy Procedure date: 02/02/24 Procedure location: [facility] Arrival Time: 10:00 am Spoke with the patient Y/N: Yes Any prep concerns? No Has the patient obtained the prep from the pharmacy ? No prep needed Do you have a care partner and transportation: Yes Any additional concerns? No

## 2024-01-27 ENCOUNTER — Encounter: Payer: Self-pay | Admitting: Family Medicine

## 2024-01-28 NOTE — Telephone Encounter (Signed)
 Pls schedule him today.

## 2024-01-29 ENCOUNTER — Encounter: Payer: Self-pay | Admitting: Urgent Care

## 2024-01-29 ENCOUNTER — Ambulatory Visit (INDEPENDENT_AMBULATORY_CARE_PROVIDER_SITE_OTHER): Admitting: Urgent Care

## 2024-01-29 ENCOUNTER — Ambulatory Visit (INDEPENDENT_AMBULATORY_CARE_PROVIDER_SITE_OTHER)

## 2024-01-29 VITALS — BP 162/60 | HR 73 | Resp 17 | Ht 70.0 in | Wt 135.2 lb

## 2024-01-29 DIAGNOSIS — L509 Urticaria, unspecified: Secondary | ICD-10-CM | POA: Diagnosis not present

## 2024-01-29 DIAGNOSIS — I442 Atrioventricular block, complete: Secondary | ICD-10-CM | POA: Diagnosis not present

## 2024-01-29 NOTE — Progress Notes (Signed)
 Established Patient Office Visit  Subjective:  Patient ID: Donald Patel, male    DOB: [birth year]  Age: 83 y.o. MRN: [id number]  Chief Complaint  Patient presents with   Follow-up    Reoccurring rash    HPI  Discussed the use of AI scribe software for clinical note transcription with the patient, who gave verbal consent to proceed.  History of Present Illness   Donald Patel is an 83 year old male who presents with recurrent rash.  The rash initially appeared a month ago, leading to a visit to urgent care where it was suspected to be caused by detergent or softener. He was prescribed medrol  dose pack for a week, which cleared the rash on the first day. However, the rash recurred two weeks ago despite switching to a free and clear detergent and softener.  He managed the recurrence with hydrocortisone cream and Benadryl, which provided relief. The rash reappeared on a Monday night, characterized by blotchy skin, primarily on the chest. It resolved by Tuesday and has not been present since.  He suspects the rash may be linked to a liquid tylenol  medication he took, which contains red dye. He uses baby aspirin  80 mg every other day and has a swallowing problem with pills over 200 mg, leading him to use liquid forms. He noted the rash appeared after using the liquid medication on a Sunday.  He has been using Benadryl, which also contains red dye and cherry flavor, and is considering switching to dye-free options due to potential sensitivity.       Patient Active Problem List   Diagnosis Date Noted   Swallowing difficulty 06/05/2023   Chronic combined systolic (congestive) and diastolic (congestive) heart failure (HCC) 06/05/2023   Atypical chest pain 05/15/2021   Schatzki's ring 05/15/2021   Emphysema lung (HCC) 11/09/2020   Pulmonary nodule seen on imaging study 11/09/2020   Aortic atherosclerosis (HCC) 05/12/2020   Chronic right-sided low back pain with right-sided sciatica  11/09/2019   Knee locking, right 05/11/2019   Vitamin D  deficiency 04/14/2019   Back pain 01/08/2018   Stool incontinence 11/13/2016   Other and combined forms of senile cataract 07/01/2013   ERECTILE DYSFUNCTION, 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   AV BLOCK, COMPLETE 07/20/2008   Essential hypertension, benign 02/25/2008   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   Past Medical History:  Diagnosis Date   AV BLOCK, COMPLETE    s/p PPM (SJM)   Cataract both eyes corrected   CHF (congestive heart failure) (HCC)    Chronic right-sided low back pain with right-sided sciatica 11/09/2019   Complication of anesthesia    slow to come out once   Emphysema lung (HCC) 11/09/2020   ERECTILE DYSFUNCTION, ORGANIC    History of colon polyps    Hypertension    Hypertension    Past Surgical History:  Procedure Laterality Date   CATARACT EXTRACTION W/ INTRAOCULAR LENS IMPLANT  2013   right eye - [facility]   CATARACT EXTRACTION W/ INTRAOCULAR LENS IMPLANT  2015   Left eye - [facility]   COLONOSCOPY  11/29/2015   [facility]   INSERT / REPLACE / REMOVE PACEMAKER  19/feb/2015   KNEE SURGERY     PACEMAKER PLACEMENT  12/22/1996   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, most recent generator (SJM) 05/02/04 both implanted by Dr Delena [facility]   PERMANENT PACEMAKER GENERATOR CHANGE N/A 07/29/2013   Procedure: PERMANENT PACEMAKER GENERATOR CHANGE;  Surgeon: Lynwood JONETTA Rakers, MD;  Location: [facility] CATH LAB;  Service: Cardiovascular;  Laterality: N/A;   PPM GENERATOR CHANGEOUT N/A 09/19/2023   Procedure: PPM GENERATOR CHANGEOUT;  Surgeon: Inocencio Soyla Lunger, MD;  Location: [facility] INVASIVE CV LAB;  Service: Cardiovascular;  Laterality: N/A;   VASECTOMY     Social History   Tobacco Use   Smoking status: Former    Current packs/day: 0.00    Average packs/day: 1.5 packs/day for 35.0 years (52.5 ttl pk-yrs)    Types: Cigarettes, Pipe, Cigars    Start date: 06/10/1950    Quit date: 06/10/1985    Years since quitting: 38.6    Smokeless tobacco: Never  Substance Use Topics   Alcohol use: Yes    Alcohol/week: 14.0 standard drinks of alcohol    Types: 14 Cans of beer per week   Drug use: No      ROS: as noted in HPI  Objective:     BP (!) 162/60   Pulse 73   Resp 17   Ht 5' 10 (1.778 m)   Wt 135 lb 4 oz (61.3 kg)   SpO2 100%   BMI 19.41 kg/m  BP Readings from Last 3 Encounters:  01/29/24 (!) 162/60  12/21/23 (!) 179/82  12/19/23 138/78   Wt Readings from Last 3 Encounters:  01/29/24 135 lb 4 oz (61.3 kg)  12/19/23 141 lb 3.2 oz (64 kg)  12/04/23 137 lb 1.9 oz (62.2 kg)      Physical Exam Vitals and nursing note reviewed.  Constitutional:      General: He is not in acute distress.    Appearance: Normal appearance. He is not ill-appearing, toxic-appearing or diaphoretic.  HENT:     Head: Normocephalic and atraumatic.     Right Ear: External ear normal.     Left Ear: External ear normal.     Nose: Nose normal.  Eyes:     General: No scleral icterus.    Pupils: Pupils are equal, round, and reactive to light.  Cardiovascular:     Rate and Rhythm: Normal rate.  Pulmonary:     Effort: Pulmonary effort is normal. No respiratory distress.  Skin:    General: Skin is warm and dry.     Findings: No erythema or rash.     Comments: Rash is not present during time of consultation  Neurological:     General: No focal deficit present.     Mental Status: He is alert and oriented to person, place, and time.      No results found for any visits on 01/29/24.  Last CBC Lab Results  Component Value Date   WBC 5.9 12/04/2023   HGB 15.7 12/04/2023   HCT 45.4 12/04/2023   MCV 96 12/04/2023   MCH 33.1 (H) 12/04/2023   RDW 12.4 12/04/2023   PLT 183 12/04/2023   Last metabolic panel Lab Results  Component Value Date   GLUCOSE 93 12/04/2023   NA 133 (L) 12/04/2023   K 4.0 12/04/2023   CL 92 (L) 12/04/2023   CO2 24 12/04/2023   BUN 13 12/04/2023   CREATININE 1.01 12/04/2023   EGFR 74  12/04/2023   CALCIUM 9.6 12/04/2023   PROT 7.4 12/04/2023   ALBUMIN 4.4 12/04/2023   LABGLOB 3.0 12/04/2023   BILITOT 0.9 12/04/2023   ALKPHOS 83 12/04/2023   AST 17 12/04/2023   ALT 13 12/04/2023      The ASCVD Risk score (Arnett DK, et al., 2019) failed to calculate for the following reasons:   The 2019 ASCVD risk  score is only valid for ages 49 to 49  Assessment & Plan:  Urticaria  Assessment and Plan    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 Previous treatment with prednisone and Benadryl effective. - Avoid liquid medication with red dye. - Avoid products with red dye. - Use dye-free Benadryl for symptoms. - Consider chewable Benadryl instead of liquid. - Return if rash recurs. - Proceed with scheduled EGD.         No follow-ups on file.   Benton LITTIE Gave, PA

## 2024-01-29 NOTE — Patient Instructions (Signed)
 Your rash appears to be hives. I suspect you are having a sensitivity to the dye or sweeteners in your OTC liquid medications. Please purchase dye-free medications.  If the rash occurs again, return the same day for a further workup.

## 2024-01-30 ENCOUNTER — Ambulatory Visit: Payer: Self-pay | Admitting: Cardiology

## 2024-01-30 LAB — CUP PACEART REMOTE DEVICE CHECK
Battery Remaining Longevity: 112 mo
Battery Remaining Percentage: 95.5 %
Battery Voltage: 3.01 V
Brady Statistic AP VP Percent: 44 %
Brady Statistic AP VS Percent: 1 %
Brady Statistic AS VP Percent: 56 %
Brady Statistic AS VS Percent: 1 %
Brady Statistic RA Percent Paced: 43 %
Brady Statistic RV Percent Paced: 99 %
Date Time Interrogation Session: 20250821032823
Implantable Lead Connection Status: 753985
Implantable Lead Connection Status: 753985
Implantable Lead Implant Date: 20051123
Implantable Lead Implant Date: 20051123
Implantable Lead Location: 753859
Implantable Lead Location: 753860
Implantable Pulse Generator Implant Date: 20250411
Lead Channel Impedance Value: 450 Ohm
Lead Channel Impedance Value: 480 Ohm
Lead Channel Pacing Threshold Amplitude: 0.5 V
Lead Channel Pacing Threshold Amplitude: 1.125 V
Lead Channel Pacing Threshold Pulse Width: 0.5 ms
Lead Channel Pacing Threshold Pulse Width: 0.5 ms
Lead Channel Sensing Intrinsic Amplitude: 4.3 mV
Lead Channel Sensing Intrinsic Amplitude: 9 mV
Lead Channel Setting Pacing Amplitude: 1.375
Lead Channel Setting Pacing Amplitude: 2 V
Lead Channel Setting Pacing Pulse Width: 0.5 ms
Lead Channel Setting Sensing Sensitivity: 5 mV
Pulse Gen Model: 2272
Pulse Gen Serial Number: 8256415

## 2024-02-02 ENCOUNTER — Ambulatory Visit (HOSPITAL_BASED_OUTPATIENT_CLINIC_OR_DEPARTMENT_OTHER): Admitting: Anesthesiology

## 2024-02-02 ENCOUNTER — Encounter (HOSPITAL_COMMUNITY): Payer: Self-pay | Admitting: Gastroenterology

## 2024-02-02 ENCOUNTER — Ambulatory Visit (HOSPITAL_COMMUNITY)
Admission: RE | Admit: 2024-02-02 | Discharge: 2024-02-02 | Disposition: A | Discharge status: Home / Self Care | Attending: Gastroenterology | Admitting: Gastroenterology

## 2024-02-02 ENCOUNTER — Encounter (HOSPITAL_COMMUNITY)
Admission: RE | Disposition: A | Discharge status: Home / Self Care | Payer: Self-pay | Source: Home / Self Care | Attending: Gastroenterology

## 2024-02-02 ENCOUNTER — Ambulatory Visit (HOSPITAL_COMMUNITY): Admitting: Anesthesiology

## 2024-02-02 ENCOUNTER — Other Ambulatory Visit: Payer: Self-pay

## 2024-02-02 DIAGNOSIS — I5040 Unspecified combined systolic (congestive) and diastolic (congestive) heart failure: Secondary | ICD-10-CM | POA: Insufficient documentation

## 2024-02-02 DIAGNOSIS — I10 Essential (primary) hypertension: Secondary | ICD-10-CM | POA: Insufficient documentation

## 2024-02-02 DIAGNOSIS — K2100 Gastro-esophageal reflux disease with esophagitis, without bleeding: Secondary | ICD-10-CM

## 2024-02-02 DIAGNOSIS — I110 Hypertensive heart disease with heart failure: Secondary | ICD-10-CM

## 2024-02-02 DIAGNOSIS — Z950 Presence of cardiac pacemaker: Secondary | ICD-10-CM | POA: Diagnosis not present

## 2024-02-02 DIAGNOSIS — I5042 Chronic combined systolic (congestive) and diastolic (congestive) heart failure: Secondary | ICD-10-CM

## 2024-02-02 DIAGNOSIS — E785 Hyperlipidemia, unspecified: Secondary | ICD-10-CM | POA: Insufficient documentation

## 2024-02-02 DIAGNOSIS — K222 Esophageal obstruction: Secondary | ICD-10-CM

## 2024-02-02 DIAGNOSIS — R1310 Dysphagia, unspecified: Secondary | ICD-10-CM

## 2024-02-02 DIAGNOSIS — I480 Paroxysmal atrial fibrillation: Secondary | ICD-10-CM | POA: Insufficient documentation

## 2024-02-02 DIAGNOSIS — Z87891 Personal history of nicotine dependence: Secondary | ICD-10-CM | POA: Diagnosis not present

## 2024-02-02 DIAGNOSIS — R1319 Other dysphagia: Secondary | ICD-10-CM

## 2024-02-02 DIAGNOSIS — R09A2 Foreign body sensation, throat: Secondary | ICD-10-CM

## 2024-02-02 DIAGNOSIS — R0989 Other specified symptoms and signs involving the circulatory and respiratory systems: Secondary | ICD-10-CM

## 2024-02-02 HISTORY — DX: Other complications of anesthesia, initial encounter: T88.59XA

## 2024-02-02 HISTORY — DX: Presence of cardiac pacemaker: Z95.0

## 2024-02-02 HISTORY — DX: Heart failure, unspecified: I50.9

## 2024-02-02 HISTORY — PX: ESOPHAGOGASTRODUODENOSCOPY: SHX5428

## 2024-02-02 SURGERY — EGD (ESOPHAGOGASTRODUODENOSCOPY)
Anesthesia: Monitor Anesthesia Care

## 2024-02-02 MED ORDER — PANTOPRAZOLE SODIUM 40 MG PO TBEC: 40.0000 mg | DELAYED_RELEASE_TABLET | Freq: Two times a day (BID) | ORAL | 1 refills | Status: DC

## 2024-02-02 MED ORDER — PROPOFOL 10 MG/ML IV BOLUS
INTRAVENOUS | Status: DC | PRN
  Administered 2024-02-02: 30 mg via INTRAVENOUS

## 2024-02-02 MED ORDER — PROPOFOL 500 MG/50ML IV EMUL
INTRAVENOUS | Status: DC | PRN
  Administered 2024-02-02: 125 ug/kg/min via INTRAVENOUS

## 2024-02-02 MED ORDER — LIDOCAINE 2% (20 MG/ML) 5 ML SYRINGE
INTRAMUSCULAR | Status: DC | PRN
  Administered 2024-02-02: 50 mg via INTRAVENOUS

## 2024-02-02 MED ORDER — SODIUM CHLORIDE 0.9 % IV SOLN: INTRAVENOUS | Status: DC

## 2024-02-02 MED ORDER — PROPOFOL 500 MG/50ML IV EMUL
INTRAVENOUS | Status: AC
  Filled 2024-02-02: qty 50

## 2024-02-02 NOTE — Anesthesia Postprocedure Evaluation (Signed)
 Anesthesia Post Note  Patient: Donald Patel  Procedure(s) Performed: EGD (ESOPHAGOGASTRODUODENOSCOPY)     Patient location during evaluation: PACU Anesthesia Type: MAC Level of consciousness: awake and alert Pain management: pain level controlled Vital Signs Assessment: post-procedure vital signs reviewed and stable Respiratory status: spontaneous breathing, nonlabored ventilation, respiratory function stable and patient connected to nasal cannula oxygen Cardiovascular status: stable and blood pressure returned to baseline Postop Assessment: no apparent nausea or vomiting Anesthetic complications: no   No notable events documented.  Last Vitals:  Vitals:   02/02/24 1041 02/02/24 1050  BP: 136/63 (!) 172/64  Pulse: 62 60  Resp: (!) 23 20  Temp:    SpO2: 95% 97%    Last Pain:  Vitals:   02/02/24 1050  TempSrc:   PainSc: 0-No pain                 Epifanio Lamar FORBES

## 2024-02-02 NOTE — Discharge Instructions (Signed)

## 2024-02-02 NOTE — Transfer of Care (Signed)
 Immediate Anesthesia Transfer of Care Note  Patient: Donald Patel  Procedure(s) Performed: EGD (ESOPHAGOGASTRODUODENOSCOPY)  Patient Location: Endoscopy Unit  Anesthesia Type:MAC  Level of Consciousness: drowsy  Airway & Oxygen Therapy: Patient Spontanous Breathing and Patient connected to nasal cannula oxygen  Post-op Assessment: Report given to RN and Post -op Vital signs reviewed and stable  Post vital signs: Reviewed and stable  Last Vitals:  Vitals Value Taken Time  BP    Temp    Pulse 60 02/02/24 10:24  Resp 23 02/02/24 10:24  SpO2 94 % 02/02/24 10:24  Vitals shown include unfiled device data.  Last Pain:  Vitals:   02/02/24 0859  TempSrc: Temporal  PainSc: 0-No pain         Complications: No notable events documented.

## 2024-02-02 NOTE — Interval H&P Note (Signed)
 History and Physical Interval Note:  02/02/2024 9:50 AM  Donald Patel  has presented today for surgery, with the diagnosis of dysphagia.  The various methods of treatment have been discussed with the patient and family. After consideration of risks, benefits and other options for treatment, the patient has consented to  Procedure(s) with comments: EGD (ESOPHAGOGASTRODUODENOSCOPY) (N/A) - ultra slim scope, fluoroscopy as a surgical intervention.  The patient's history has been reviewed, patient examined, no change in status, stable for surgery.  I have reviewed the patient's chart and labs.  Questions were answered to the patient's satisfaction.     Sandor GAILS Johneisha Broaden

## 2024-02-02 NOTE — Anesthesia Preprocedure Evaluation (Signed)
 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 Left ventricular ejection fraction by 3D volume is 53 %. The left  ventricle has low normal function. The left ventricle has no regional wall  motion abnormalities. The left  ventricular internal cavity size was normal in size. There is no left  ventricular hypertrophy. Abnormal (paradoxical) septal motion, consistent  with RV pacemaker. Left ventricular diastolic parameters are consistent  with Grade I diastolic dysfunction  (impaired relaxation).    Dual AV Paced R 60bpm     Neuro/Psych  Neuromuscular disease  negative psych ROS   GI/Hepatic negative GI ROS, Neg liver ROS,,,  Endo/Other  negative endocrine ROS    Renal/GU negative Renal ROS     Musculoskeletal   Abdominal   Peds  Hematology negative hematology ROS (+)   Anesthesia Other Findings All: Lisinopril   Reproductive/Obstetrics                              Anesthesia Physical Anesthesia Plan  ASA: 3  Anesthesia Plan: MAC   Post-op Pain Management: Minimal or no pain anticipated   Induction: Intravenous  PONV Risk Score and Plan: 1 and Propofol  infusion and Treatment may vary due to age or medical condition  Airway Management Planned: Natural Airway, Nasal Cannula and Simple Face Mask  Additional Equipment: None  Intra-op Plan:   Post-operative  Plan:   Informed Consent: I have reviewed the patients History and Physical, chart, labs and discussed the procedure including the risks, benefits and alternatives for the proposed anesthesia with the patient or authorized representative who has indicated his/her understanding and acceptance.     Dental advisory given  Plan Discussed with: CRNA  Anesthesia Plan Comments: ( )         Anesthesia Quick Evaluation

## 2024-02-02 NOTE — H&P (Signed)
 GASTROENTEROLOGY PROCEDURE H&P NOTE   Primary Care Physician: Alvan Dorothyann BIRCH, MD    Reason for Procedure:  Dysphagia, hoarseness, globus sensation, esophageal stricture  Plan:    EGD   Patient is appropriate for endoscopic procedure(s) at [facility].  The nature of the procedure, as well as the risks, benefits, and alternatives were carefully and thoroughly reviewed with the patient. Ample time for discussion and questions allowed. The patient understood, was satisfied, and agreed to proceed.     HPI: Donald Patel is a 83 y.o. male with a history of HTN, history of AV block s/p PPM, HTN, paroxysmal A-fib (not on anticoagulation), combined systolic/diastolic congestive heart failure (EF 50-55%), emphysema, HLD who presents for EGD for esophageal stricture and dysphagia, hoarseness, and globus sensation.  Has been having dysphagia since at least 2022 with upper GI evaluation as below.  Otherwise, no significant changes in clinical history since last office visit on 12/02/2023.  - 02/13/2021: Evaluation in the GI clinic for dysphagia with pills, pointing to suprasternal notch.  Will occasionally regurgitate pill back out.  Weight stable. Only occurs when pill is >200 mg. - 02/22/2021: Esophagram: piecemeal swallowing with laryngeal penetration to the cords and a prominent cricopharyngeus muscle.  Small type I hiatal hernia and a 1.9 cm nonobstructing Schatzki's ring.  Did have some apprehension about swallowing the barium tablet, but once he did, tablet passed without issue into the stomach.  Referred to speech pathology --04/30/2023 MBSS- FINDINGS: Vestibular  Penetration:  Seen with thin and nectar thick liquids. Aspiration:  Silent aspiration with thin liquid. Other:  Retention with all tested modalities. --EGD (04/11/2023) Dr. San for dysphagia- - Normal oropharynx. - Severe narrowing at the cricopharyngeus, with possible diverticulum. This  was not traversable. - No specimens collected.   Past Medical History:  Diagnosis Date   AV BLOCK, COMPLETE    s/p PPM (SJM)   Cataract both eyes corrected   CHF (congestive heart failure) (HCC)    Chronic right-sided low back pain with right-sided sciatica 11/09/2019   Complication of anesthesia    slow to come out once   Emphysema lung (HCC) 11/09/2020   ERECTILE DYSFUNCTION, ORGANIC    History of colon polyps    Hypertension    Hypertension    Presence of permanent cardiac pacemaker     Past Surgical History:  Procedure Laterality Date   CATARACT EXTRACTION W/ INTRAOCULAR LENS IMPLANT  2013   right eye - [facility]   CATARACT EXTRACTION W/ INTRAOCULAR LENS IMPLANT  2015   Left eye - [facility]   COLONOSCOPY  11/29/2015   [facility]   INSERT / REPLACE / REMOVE PACEMAKER  19/feb/2015   KNEE SURGERY     PACEMAKER PLACEMENT  12/22/1996   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, most recent generator (SJM) 05/02/04 both implanted by Dr Delena [facility]   PERMANENT PACEMAKER GENERATOR CHANGE N/A 07/29/2013   Procedure: PERMANENT PACEMAKER GENERATOR CHANGE;  Surgeon: Lynwood BIRCH Rakers, MD;  Location: [facility] CATH LAB;  Service: Cardiovascular;  Laterality: N/A;   PPM GENERATOR CHANGEOUT N/A 09/19/2023   Procedure: PPM GENERATOR CHANGEOUT;  Surgeon: Inocencio Soyla Lunger, MD;  Location: [facility] INVASIVE CV LAB;  Service: Cardiovascular;  Laterality: N/A;   VASECTOMY      Prior to Admission medications   Medication Sig Start Date End Date Taking? Authorizing Provider  acetaminophen  (TYLENOL ) 160 MG/5ML suspension Take 320 mg by mouth every 6 (six) hours as needed  for moderate pain (pain score 4-6) or mild pain (pain score 1-3).   Yes [provider]  aspirin  EC 81 MG tablet Take 1 tablet (81 mg total) by mouth daily. Swallow whole. 06/28/22  Yes Pietro Redell RAMAN, MD  Cholecalciferol (VITAMIN D3) LIQD Take 4 drops by mouth daily. In Water 1000 mcg   Yes [provider]   ibuprofen (ADVIL) 200 MG tablet Take 200 mg by mouth daily as needed for mild pain (pain score 1-3) or moderate pain (pain score 4-6).   Yes [provider]  ipratropium (ATROVENT) 0.06 % nasal spray Place 2 sprays into both nostrils 2 (two) times daily. 08/21/23  Yes [provider]  losartan  (COZAAR ) 25 MG tablet TAKE 1 TABLET(25 MG) BY MOUTH DAILY 07/18/23  Yes Crenshaw, Redell RAMAN, MD  metoprolol  succinate (TOPROL -XL) 25 MG 24 hr tablet TAKE 1 TABLET(25 MG) BY MOUTH DAILY 01/15/24  Yes Crenshaw, Redell RAMAN, MD  Multiple Vitamins-Minerals (PRESERVISION AREDS 2) CHEW Chew 1 tablet by mouth daily.   Yes [provider]  hydrochlorothiazide  (HYDRODIURIL ) 25 MG tablet TAKE 1 TABLET(25 MG) BY MOUTH DAILY Patient not taking: Reported on 12/19/2023 06/05/23   Pietro Redell RAMAN, MD    Current Facility-Administered Medications  Medication Dose Route Frequency Provider Last Rate Last Admin   0.9 %  sodium chloride  infusion   Intravenous Continuous May, Deanna J, NP 20 mL/hr at 02/02/24 9085 Restarted at 02/02/24 0915    Allergies as of 12/26/2023 - Review Complete 12/21/2023  Allergen Reaction Noted   Anoro ellipta  [umeclidinium-vilanterol] Other (See Comments) 11/13/2021   Lisinopril  Other (See Comments) 04/14/2019    Family History  Problem Relation Age of Onset   Breast cancer Mother    Heart attack Brother    Diabetes Other    Hodgkin's lymphoma Brother    Colon cancer Neg Hx    Esophageal cancer Neg Hx    Rectal cancer Neg Hx    Stomach cancer Neg Hx     Social History   Socioeconomic History   Marital status: Married    Spouse name: Not on file   Number of children: Not on file   Years of education: Not on file   Highest education level: 12th grade  Occupational History   Occupation: Retired  Tobacco Use   Smoking status: Former    Current packs/day: 0.00    Average packs/day: 1.5 packs/day for 35.0 years (52.5 ttl pk-yrs)    Types: Cigarettes, Pipe,  Cigars    Start date: 06/10/1950    Quit date: 06/10/1985    Years since quitting: 38.6   Smokeless tobacco: Never  Substance and Sexual Activity   Alcohol use: Yes    Alcohol/week: 14.0 standard drinks of alcohol    Types: 14 Cans of beer per week   Drug use: No   Sexual activity: Never  Other Topics Concern   Not on file  Social History Narrative   Recently retired,Former Chartered certified accountant.  Exercise.    Social Drivers of Corporate investment banker Strain: Low Risk  (11/30/2023)   Overall Financial Resource Strain (CARDIA)    Difficulty of Paying Living Expenses: Not hard at all  Food Insecurity: No Food Insecurity (11/30/2023)   Hunger Vital Sign    Worried About Running Out of Food in the Last Year: Never true    Ran Out of Food in the Last Year: Never true  Transportation Needs: No Transportation Needs (11/30/2023)   PRAPARE - Transportation  Lack of Transportation (Medical): No    Lack of Transportation (Non-Medical): No  Physical Activity: Inactive (11/30/2023)   Exercise Vital Sign    Days of Exercise per Week: 0 days    Minutes of Exercise per Session: Not on file  Stress: No Stress Concern Present (11/30/2023)   [employer] of Occupational Health - Occupational Stress Questionnaire    Feeling of Stress: Not at all  Social Connections: Moderately Isolated (11/30/2023)   Social Connection and Isolation Panel    Frequency of Communication with Friends and Family: More than three times a week    Frequency of Social Gatherings with Friends and Family: Twice a week    Attends Religious Services: Patient declined    Database administrator or Organizations: No    Attends Engineer, structural: Not on file    Marital Status: Married  Catering manager Violence: Not on file    Physical Exam: Vital signs in last 24 hours: @BP  (!) 172/69   Pulse 60   Temp (!) 97.1 F (36.2 C) (Temporal)   Resp (!) 25   Ht 5' 10 (1.778 m)   Wt 61.2 kg   SpO2 100%   BMI 19.37 kg/m   GEN: NAD EYE: Sclerae anicteric ENT: MMM CV: Non-tachycardic Pulm: CTA b/l GI: Soft, NT/ND NEURO:  Alert & Oriented x 3   Sandor Flatter, DO [facility]   02/02/2024 9:44 AM

## 2024-02-02 NOTE — Op Note (Signed)
 [facility] Patient Name: Donald Patel Procedure Date: 02/02/2024 MRN: [id number] Attending MD: Sandor Flatter , MD, [id number] Date of Birth: 01-18-1941 CSN: [id number] Age: 83 Admit Type: Outpatient Procedure:                Upper GI endoscopy Indications:              Dysphagia, For therapy of esophageal stricture,                            Globus sensation Providers:                Sandor Flatter, MD, Almarie Masters, RN, Felice Sar, Technician Referring MD:              Medicines:                Monitored Anesthesia Care Complications:            No immediate complications. Estimated Blood Loss:     Estimated blood loss was minimal. Procedure:                Pre-Anesthesia Assessment:                           - Prior to the procedure, a History and Physical                            was performed, and patient medications and                            allergies were reviewed. The patient's tolerance of                            previous anesthesia was also reviewed. The risks                            and benefits of the procedure and the sedation                            options and risks were discussed with the patient.                            All questions were answered, and informed consent                            was obtained. Prior Anticoagulants: The patient has                            taken no anticoagulant or antiplatelet agents                            except for aspirin . ASA Grade Assessment: III - A  patient with severe systemic disease. After                            reviewing the risks and benefits, the patient was                            deemed in satisfactory condition to undergo the                            procedure.                           After obtaining informed consent, the endoscope was                            passed under direct vision. Throughout the                             procedure, the patient's blood pressure, pulse, and                            oxygen saturations were monitored continuously. The                            [id number]-H190 ([id number]) Olympus endoscope was introduced                            through the mouth, and advanced to the second part                            of duodenum. The GIF-[id number] ([id number]) Olympus                            endoscope was introduced through the mouth, and                            advanced to the second part of duodenum. The upper                            GI endoscopy was accomplished without difficulty.                            The patient tolerated the procedure well. Scope In: Scope Out: Findings:      One benign-appearing, intrinsic severe stenosis was found at the       cricopharyngeus. This stenosis measured 1 cm (in length). The stenosis       was traversed with the Ultraslim scope. A guidewire was placed and the       scope was withdrawn. Dilation was performed with a Savary dilator with       no resistance at 6 mm and 9 mm and mild resistance at 12 mm. The       dilation site was examined following endoscope reinsertion and showed       mild mucosal disruption and moderate improvement in luminal narrowing.  Estimated blood loss was minimal. Was then able to pass the standard       endoscope without resistence.      One benign-appearing, intrinsic mild stenosis was found in the lower       third of the esophagus. This stenosis measured 1 cm (in length). The       stenosis was traversed. A TTS dilator was passed through the scope.       Dilation with a 15-16.5-18 mm balloon dilator was performed to 18 mm.       The dilation site was examined and showed moderate improvement in       luminal narrowing. Biopsies were taken with a cold forceps for dual       purpose of further fracturing of the stricture and histology. Estimated       blood loss was minimal.      LA Grade A  (one or more mucosal breaks less than 5 mm, not extending       between tops of 2 mucosal folds) esophagitis with no bleeding was found       in the lower third of the esophagus.      The gastroesophageal flap valve was visualized endoscopically and       classified as Hill Grade III (minimal fold, loose to endoscope, hiatal       hernia likely).      The entire examined stomach was normal.      The examined duodenum was normal. Impression:               - Benign-appearing esophageal stenosis at the                            cricopharyngeus. This was traversable with the                            ultraslim scope and dilated with a 12 mm Savary                            dilator which then allowed for passage of the                            standard endoscope.                           - Benign-appearing esophageal peptic stricture in                            the distal esophagus. Dilated with 18 mm TTS                            balloon then fractured with forceps.                           - LA Grade A reflux esophagitis with no bleeding.                            Biopsied.                           -  Gastroesophageal flap valve classified as Hill                            Grade III (minimal fold, loose to endoscope, hiatal                            hernia likely).                           - Normal stomach.                           - Normal examined duodenum. Moderate Sedation:      Not Applicable - Patient had care per Anesthesia. Recommendation:           - Patient has a contact number available for                            emergencies. The signs and symptoms of potential                            delayed complications were discussed with the                            patient. Return to normal activities tomorrow.                            Written discharge instructions were provided to the                            patient.                           - Soft diet  today, then advance as tolerated                            tomorrow per postdilation protocol.                           - Continue present medications.                           - Await pathology results.                           - Repeat upper endoscopy PRN for retreatment.                           - Will place prescription for pantoprazole                             (Protonix ) 40 mg p.o. twice daily x 6 weeks to                            promote mucosal healing of esophagitis and peptic  stricture. If symptoms well-controlled, reduce to                            40 mg daily and can potentially titrate back off                            again if no further symptoms.                           - Return to GI clinic at routine appointment to be                            scheduled. Procedure Code(s):        --- Professional ---                           [phone], Esophagogastroduodenoscopy, flexible,                            transoral; with insertion of guide wire followed by                            passage of dilator(s) through esophagus over guide                            wire                           43249, Esophagogastroduodenoscopy, flexible,                            transoral; with transendoscopic balloon dilation of                            esophagus (less than 30 mm diameter)                           43239, 59, Esophagogastroduodenoscopy, flexible,                            transoral; with biopsy, single or multiple Diagnosis Code(s):        --- Professional ---                           K22.2, Esophageal obstruction                           K21.00, Gastro-esophageal reflux disease with                            esophagitis, without bleeding                           R13.10, Dysphagia, unspecified                           F45.8, Other somatoform disorders CPT copyright 2022 American Medical  Association. All rights reserved. The codes  documented in this report are preliminary and upon coder review may  be revised to meet current compliance requirements. Sandor Flatter, MD 02/02/2024 10:33:56 AM Number of Addenda: 0

## 2024-02-03 ENCOUNTER — Ambulatory Visit: Payer: Self-pay | Admitting: Gastroenterology

## 2024-02-03 ENCOUNTER — Telehealth: Payer: Self-pay

## 2024-02-03 ENCOUNTER — Encounter (HOSPITAL_COMMUNITY): Payer: Self-pay | Admitting: Gastroenterology

## 2024-02-03 LAB — SURGICAL PATHOLOGY

## 2024-02-03 NOTE — Telephone Encounter (Signed)
 Per 8/25 procedure report - Return to office for routine appt  Patient has been scheduled for a follow up with Dr. San on 04/08/24 at 10:40 am. Appt information sent to patient via MyChart.

## 2024-02-04 ENCOUNTER — Encounter: Payer: Self-pay | Admitting: Gastroenterology

## 2024-03-01 ENCOUNTER — Encounter: Payer: Self-pay | Admitting: Gastroenterology

## 2024-03-03 NOTE — Progress Notes (Signed)
 Remote PPM Transmission

## 2024-04-08 ENCOUNTER — Ambulatory Visit: Admitting: Gastroenterology

## 2024-04-08 ENCOUNTER — Encounter: Payer: Self-pay | Admitting: Gastroenterology

## 2024-04-08 VITALS — BP 140/62 | HR 65 | Ht 70.0 in | Wt 138.5 lb

## 2024-04-08 DIAGNOSIS — R0989 Other specified symptoms and signs involving the circulatory and respiratory systems: Secondary | ICD-10-CM | POA: Diagnosis not present

## 2024-04-08 DIAGNOSIS — Z860100 Personal history of colon polyps, unspecified: Secondary | ICD-10-CM

## 2024-04-08 DIAGNOSIS — K222 Esophageal obstruction: Secondary | ICD-10-CM

## 2024-04-08 DIAGNOSIS — K2100 Gastro-esophageal reflux disease with esophagitis, without bleeding: Secondary | ICD-10-CM

## 2024-04-08 DIAGNOSIS — R1310 Dysphagia, unspecified: Secondary | ICD-10-CM

## 2024-04-08 DIAGNOSIS — Z860101 Personal history of adenomatous and serrated colon polyps: Secondary | ICD-10-CM | POA: Diagnosis not present

## 2024-04-08 MED ORDER — FAMOTIDINE 20 MG PO TABS: 20.0000 mg | ORAL_TABLET | Freq: Every day | ORAL | 3 refills | Status: DC

## 2024-04-08 NOTE — Progress Notes (Signed)
 Chief Complaint:    Esophageal stricture, dysphagia  GI History: Donald Patel is a 83 y.o. 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    Initially seen in the GI clinic on 09/08/2020 for evaluation of fecal seepage which started after hemorrhoid banding at outside facility in 2013.  No nocturnal leakage.  No urge incontinence. - Colonoscopy (09/2020): Tattoo with adjacent scar and subtle nodularity in proximal ascending colon (biopsied: Benign), 3 subcentimeter tubular adenomas, sigmoid diverticulosis, internal hemorrhoids with scars from prior banding.  Recommend repeat in 3 years for continued surveillance   Separately, follows in GI clinic for dysphagia. - 02/13/2021: Evaluation in the GI clinic for dysphagia with pills, pointing to suprasternal notch.  Will occasionally regurgitate pill back out.  Weight stable. Only occurs when pill is >200 mg. - 02/22/2021: Esophagram: piecemeal swallowing with laryngeal penetration to the cords and a prominent cricopharyngeus muscle.  Small type I hiatal hernia and a 1.9 cm nonobstructing Schatzki's ring.  Did have some apprehension about swallowing the barium tablet, but once he did, tablet passed without issue into the stomach.  Referred to speech pathology --04/30/2023 MBSS- FINDINGS: Vestibular  Penetration:  Seen with thin and nectar thick liquids. Aspiration:  Silent aspiration with thin liquid. Other:  Retention with all tested modalities.     Endoscopic History: -Colonoscopy (03/2012, [facility]): Diminutive cecal polyp (SSP), 1.5 cm ascending colon polyp resected piecemeal and tattooed (TA), diminutive transverse polyp (TA), medium-sized internal/external hemorrhoids, sigmoid diverticulosis.  Recommend repeat in 6 months -Hemorrhoid banding at 3 separate sessions in 05/2012 -Colonoscopy  (07/2012, Dr. Jefrey  at [facility]): 7-8 mm polyp at hepatic flexure, cecal polyp, prior polypectomy site in ascending colon biopsy, Scar from prior hemorrhoid banding and distal rectal, sigmoid diverticulosis.  No path review.  Repeat in 3 years -Colonoscopy (11/2015, Dr. Jefrey at [facility]): 5 mm tubular adenoma in ascending colon, no residual polyp at prior descending colon polypectomy site, pandiverticulosis, external hemorrhoids.  Scar from prior hemorrhoid banding in distal rectum.  Recommended repeat in 5 years - Colonoscopy (09/2020): Tattoo with adjacent scar and subtle nodularity in proximal ascending colon (biopsied: Benign), 3 subcentimeter tubular adenomas, sigmoid diverticulosis, internal hemorrhoids with scars from prior banding.  Recommend repeat in 3-5 years for continued surveillance --EGD (04/11/2023) Dr. San for dysphagia- - Normal oropharynx. - Severe narrowing at the cricopharyngeus, with possible diverticulum. This was not traversable. - No specimens collected. - EGD (02/02/2024): Benign-appearing stenosis at cricopharyngeus traversed with ultraslim scope and dilated with 12 mm Savary with appropriate mucosal disruption then able to pass the standard endoscope.  Second stricture in the lower esophagus dilated with 18 mm TTS balloon and fractured with forceps.  LA Grade A erosive esophagitis, Hill grade 3 valve, normal stomach and duodenum.  Recommended pantoprazole  40 mg twice daily   10/14/23 cardiology follow-up.  Had pacemaker generator change April 2025.  Since last seen he denies dyspnea, chest pain, palpitations or syncope.     HPI:     Patient is a 83 y.o. male presenting to the Gastroenterology Clinic for follow-up.  Was last seen in the GI clinic on 11/28/2023 by Deanna May, NP.  Main issue at that time was dysphagia and increased throat clearing.  Declined trial of PPI.  Otherwise no lower GI symptoms.  Was scheduled for upper endoscopy,  which was completed on  02/02/2024 at [facility] Endoscopy unit with dilation of a proximal esophageal stricture with a 12 mm Savary dilator and a distal esophageal stricture with an 18 mm TTS balloon and forceps fracture.  Today, he states symptoms are overall improved following EGD with dilation as above, but still with intermittent dysphagia.  Only occurs with swallowing pills. No issue tolerating solid food or liquids.  Has been using Tums at night for indigestion with improvement and otherwise able to sleep through the night when he takes a Tums at bedtime.  Still with increased throat clearing and difficulty controlling phlegm.  Reports he had headaches with Protonix  (took for 2 weeks), so stopped taking and headaches resolved.   Review of systems:     No chest pain, no SOB, no fevers, no urinary sx   Past Medical History:  Diagnosis Date   AV BLOCK, COMPLETE    s/p PPM (SJM)   Cataract both eyes corrected   CHF (congestive heart failure) (HCC)    Chronic right-sided low back pain with right-sided sciatica 11/09/2019   Complication of anesthesia    slow to come out once   Emphysema lung (HCC) 11/09/2020   ERECTILE DYSFUNCTION, ORGANIC    History of colon polyps    Hypertension    Hypertension    Presence of permanent cardiac pacemaker     Patient's surgical history, family medical history, social history, medications and allergies were all reviewed in Epic    Current Outpatient Medications  Medication Sig Dispense Refill   acetaminophen  (TYLENOL ) 160 MG/5ML suspension Take 320 mg by mouth every 6 (six) hours as needed for moderate pain (pain score 4-6) or mild pain (pain score 1-3).     aspirin  EC 81 MG tablet Take 1 tablet (81 mg total) by mouth daily. Swallow whole. 90 tablet 3   Cholecalciferol (VITAMIN D3) LIQD Take 4 drops by mouth daily. In Water 1000 mcg     ibuprofen (ADVIL) 200 MG tablet Take 200 mg by mouth daily as needed for mild pain (pain score 1-3) or moderate  pain (pain score 4-6).     ipratropium (ATROVENT) 0.06 % nasal spray Place 2 sprays into both nostrils 2 (two) times daily.     losartan  (COZAAR ) 25 MG tablet TAKE 1 TABLET(25 MG) BY MOUTH DAILY 90 tablet 2   metoprolol  succinate (TOPROL -XL) 25 MG 24 hr tablet TAKE 1 TABLET(25 MG) BY MOUTH DAILY 90 tablet 3   Multiple Vitamins-Minerals (PRESERVISION AREDS 2) CHEW Chew 1 tablet by mouth daily.     pantoprazole  (PROTONIX ) 40 MG tablet Take 1 tablet (40 mg total) by mouth 2 (two) times daily. Take 1 tablet by mouth twice daily for 6 weeks, then reduce to 1 tablet by mouth daily 90 tablet 1   hydrochlorothiazide  (HYDRODIURIL ) 25 MG tablet TAKE 1 TABLET(25 MG) BY MOUTH DAILY (Patient not taking: Reported on 04/08/2024) 90 tablet 3   No current facility-administered medications for this visit.    Physical Exam:     BP (!) 140/62 (BP Location: Left Arm, Patient Position: Sitting, Cuff Size: Normal)   Pulse 65   Ht 5' 10 (1.778 m)   Wt 138 lb 8 oz (62.8 kg)   BMI 19.87 kg/m   GENERAL:  Pleasant male in NAD PSYCH: : Cooperative, normal affect NEURO: Alert and oriented x 3, no focal neurologic deficits   IMPRESSION and PLAN:    1) Esophageal stricture 2) Pill dysphagia 3) Increased throat clearing 4) GERD with erosive esophagitis Continues to  have increased throat clearing and difficulty handling secretion/phlegm likely multifactorial due to proximal esophageal stricture vs uncontrolled reflux.  Also follows with ENT.  Proximal esophageal stricture was quite narrow and only traversable with the ultraslim scope on EGD in 01/2024, and eventually dilated to 12 mm which allowed for passage of a standard endoscope.  More distal esophageal stricture was dilated with 18 mm TTS balloon and fracture with forceps.  Unfortunately, was intolerant to pantoprazole  (headaches), so this was stopped.  - Will try Pepcid 20 mg at bedtime to start and can uptitrate if needed - Repeat EGD with additional  esophageal dilation and possibly Kenalog injection of the proximal esophageal stricture.  Will try to schedule for December per patient request - Otherwise largely able to tolerate food and liquids without issue  5) History of colon polyps Last colonoscopy in 09/2020 with 3 subcentimeter adenomas with recommendation to repeat in 3-5 years.  Reviewed prior report.  Can hold off on repeat colonoscopy at this juncture and address more acute pressing issues as above first      The indications, risks, and benefits of EGD were explained to the patient in detail. Risks include but are not limited to bleeding, perforation, adverse reaction to medications, and cardiopulmonary compromise. Sequelae include but are not limited to the possibility of surgery, hospitalization, and mortality. The patient verbalized understanding and wished to proceed. All questions answered, referred to scheduler. Further recommendations pending results of the exam.    I spent 30 minutes of time, including in depth chart review, independent review of results as outlined above, communicating results with the patient directly, face-to-face time with the patient, coordinating care, and ordering studies and medications as appropriate, and documentation.       Donald Patel ,DO, FACG 04/08/2024, 10:35 AM

## 2024-04-08 NOTE — Patient Instructions (Signed)
 _______________________________________________________  If your blood pressure at your visit was 140/90 or greater, please contact your primary care physician to follow up on this.  _______________________________________________________  If you are age 84 or older, your body mass index should be between 23-30. Your Body mass index is 19.87 kg/m. If this is out of the aforementioned range listed, please consider follow up with your Primary Care Provider.  If you are age 84 or younger, your body mass index should be between 19-25. Your Body mass index is 19.87 kg/m. If this is out of the aformentioned range listed, please consider follow up with your Primary Care Provider.   ________________________________________________________  The [facility] providers would like to encourage you to use MYCHART to communicate with providers for non-urgent requests or questions.  Due to long hold times on the telephone, sending your provider a message by [facility] may be a faster and more efficient way to get a response.  Please allow 48 business hours for a response.  Please remember that this is for non-urgent requests.  _______________________________________________________  [facility] is using a team-based approach to care.  Your team is made up of your doctor and two to three APPS. Our APPS (Nurse Practitioners and Physician Assistants) work with your physician to ensure care continuity for you. They are fully qualified to address your health concerns and develop a treatment plan. They communicate directly with your gastroenterologist to care for you. Seeing the Advanced Practice Practitioners on your physician's team can help you by facilitating care more promptly, often allowing for earlier appointments, access to diagnostic testing, procedures, and other specialty referrals.   We have sent the following medications to your pharmacy for you to pick up at your convenience:  START: Pepcid  20mg  one tablet at bedtime each night  You have been scheduled for an endoscopy. Please follow written instructions given to you at your visit today.  If you use inhalers (even only as needed), please bring them with you on the day of your procedure.  If you take any of the following medications, they will need to be adjusted prior to your procedure:   DO NOT TAKE 7 DAYS PRIOR TO TEST- Trulicity (dulaglutide) Ozempic, Wegovy (semaglutide) Mounjaro (tirzepatide) Bydureon Bcise (exanatide extended release)  DO NOT TAKE 1 DAY PRIOR TO YOUR TEST Rybelsus (semaglutide) Adlyxin (lixisenatide) Victoza (liraglutide) Byetta (exanatide) ___________________________________________________________________________  It was a pleasure to see you today!  Vito Cirigliano, D.O.

## 2024-04-12 ENCOUNTER — Other Ambulatory Visit: Payer: Self-pay | Admitting: Cardiology

## 2024-04-12 DIAGNOSIS — I5042 Chronic combined systolic (congestive) and diastolic (congestive) heart failure: Secondary | ICD-10-CM

## 2024-04-29 ENCOUNTER — Ambulatory Visit

## 2024-04-29 DIAGNOSIS — I442 Atrioventricular block, complete: Secondary | ICD-10-CM

## 2024-04-30 LAB — CUP PACEART REMOTE DEVICE CHECK
Battery Remaining Longevity: 106 mo
Battery Remaining Percentage: 95.5 %
Battery Voltage: 2.99 V
Brady Statistic AP VP Percent: 54 %
Brady Statistic AP VS Percent: 1 %
Brady Statistic AS VP Percent: 46 %
Brady Statistic AS VS Percent: 1 %
Brady Statistic RA Percent Paced: 53 %
Brady Statistic RV Percent Paced: 99 %
Date Time Interrogation Session: 20251120031254
Implantable Lead Connection Status: 753985
Implantable Lead Connection Status: 753985
Implantable Lead Implant Date: 20051123
Implantable Lead Implant Date: 20051123
Implantable Lead Location: 753859
Implantable Lead Location: 753860
Implantable Pulse Generator Implant Date: 20250411
Lead Channel Impedance Value: 410 Ohm
Lead Channel Impedance Value: 450 Ohm
Lead Channel Pacing Threshold Amplitude: 0.5 V
Lead Channel Pacing Threshold Amplitude: 1.125 V
Lead Channel Pacing Threshold Pulse Width: 0.5 ms
Lead Channel Pacing Threshold Pulse Width: 0.5 ms
Lead Channel Sensing Intrinsic Amplitude: 12 mV
Lead Channel Sensing Intrinsic Amplitude: 3.9 mV
Lead Channel Setting Pacing Amplitude: 1.375
Lead Channel Setting Pacing Amplitude: 2 V
Lead Channel Setting Pacing Pulse Width: 0.5 ms
Lead Channel Setting Sensing Sensitivity: 5 mV
Pulse Gen Model: 2272
Pulse Gen Serial Number: 8256415

## 2024-05-02 ENCOUNTER — Ambulatory Visit: Payer: Self-pay | Admitting: Cardiology

## 2024-05-03 ENCOUNTER — Other Ambulatory Visit: Payer: Self-pay | Admitting: Gastroenterology

## 2024-05-03 NOTE — Progress Notes (Signed)
 Remote PPM Transmission

## 2024-05-04 ENCOUNTER — Encounter (HOSPITAL_COMMUNITY): Payer: Self-pay | Admitting: Gastroenterology

## 2024-05-05 ENCOUNTER — Telehealth: Payer: Self-pay

## 2024-05-05 NOTE — Telephone Encounter (Signed)
 Procedure:EGD Procedure date: 05/11/24 Procedure location: [facility] Arrival Time: 9:53 Spoke with the patient Y/N: Y Any prep concerns? N  Has the patient obtained the prep from the pharmacy ? N Do you have a care partner and transportation: Y Any additional concerns? N

## 2024-05-11 ENCOUNTER — Encounter (HOSPITAL_COMMUNITY): Payer: Self-pay | Admitting: Gastroenterology

## 2024-05-11 ENCOUNTER — Encounter (HOSPITAL_COMMUNITY): Admitting: Anesthesiology

## 2024-05-11 ENCOUNTER — Ambulatory Visit (HOSPITAL_COMMUNITY): Admitting: Anesthesiology

## 2024-05-11 ENCOUNTER — Encounter (HOSPITAL_COMMUNITY)
Admission: RE | Disposition: A | Discharge status: Home / Self Care | Payer: Self-pay | Source: Home / Self Care | Attending: Gastroenterology

## 2024-05-11 ENCOUNTER — Other Ambulatory Visit: Payer: Self-pay

## 2024-05-11 ENCOUNTER — Ambulatory Visit (HOSPITAL_COMMUNITY)
Admission: RE | Admit: 2024-05-11 | Discharge: 2024-05-11 | Disposition: A | Discharge status: Home / Self Care | Attending: Gastroenterology | Admitting: Gastroenterology

## 2024-05-11 DIAGNOSIS — Z87891 Personal history of nicotine dependence: Secondary | ICD-10-CM

## 2024-05-11 DIAGNOSIS — K222 Esophageal obstruction: Secondary | ICD-10-CM | POA: Diagnosis not present

## 2024-05-11 DIAGNOSIS — I110 Hypertensive heart disease with heart failure: Secondary | ICD-10-CM

## 2024-05-11 DIAGNOSIS — I5042 Chronic combined systolic (congestive) and diastolic (congestive) heart failure: Secondary | ICD-10-CM | POA: Diagnosis not present

## 2024-05-11 DIAGNOSIS — K2100 Gastro-esophageal reflux disease with esophagitis, without bleeding: Secondary | ICD-10-CM

## 2024-05-11 DIAGNOSIS — R1310 Dysphagia, unspecified: Secondary | ICD-10-CM

## 2024-05-11 DIAGNOSIS — R0989 Other specified symptoms and signs involving the circulatory and respiratory systems: Secondary | ICD-10-CM

## 2024-05-11 HISTORY — PX: ESOPHAGEAL DILATION: SHX303

## 2024-05-11 HISTORY — PX: ESOPHAGOGASTRODUODENOSCOPY: SHX5428

## 2024-05-11 SURGERY — EGD (ESOPHAGOGASTRODUODENOSCOPY)
Anesthesia: Monitor Anesthesia Care

## 2024-05-11 MED ORDER — LIDOCAINE 2% (20 MG/ML) 5 ML SYRINGE
INTRAMUSCULAR | Status: DC | PRN
  Administered 2024-05-11: 60 mg via INTRAVENOUS

## 2024-05-11 MED ORDER — PROPOFOL 500 MG/50ML IV EMUL
INTRAVENOUS | Status: DC | PRN
  Administered 2024-05-11: 20 mg via INTRAVENOUS
  Administered 2024-05-11: 120 ug/kg/min via INTRAVENOUS

## 2024-05-11 MED ORDER — SODIUM CHLORIDE 0.9 % IV SOLN: INTRAVENOUS | Status: DC

## 2024-05-11 MED ORDER — TRIAMCINOLONE ACETONIDE 40 MG/ML IJ SUSP
INTRAMUSCULAR | Status: AC
  Filled 2024-05-11: qty 1

## 2024-05-11 MED ORDER — SODIUM CHLORIDE (PF) 0.9 % IJ SOLN
INTRAMUSCULAR | Status: AC
  Filled 2024-05-11: qty 10

## 2024-05-11 MED ORDER — ONDANSETRON HCL 4 MG/2ML IJ SOLN
INTRAMUSCULAR | Status: DC | PRN
  Administered 2024-05-11: 4 mg via INTRAVENOUS

## 2024-05-11 MED ORDER — PROPOFOL 1000 MG/100ML IV EMUL
INTRAVENOUS | Status: AC
  Filled 2024-05-11: qty 100

## 2024-05-11 NOTE — H&P (Signed)
 GASTROENTEROLOGY PROCEDURE H&P NOTE   Primary Care Physician: Alvan Dorothyann BIRCH, MD    Reason for Procedure:   Dysphagia, esophageal stricture  Plan:    EGD with dilation and possible Kenalog injection   Patient is appropriate for endoscopic procedure(s) at [facility].  The nature of the procedure, as well as the risks, benefits, and alternatives were carefully and thoroughly reviewed with the patient. Ample time for discussion and questions allowed. The patient understood, was satisfied, and agreed to proceed. I personally addressed all patient questions and concerns.     HPI: Donald Patel is a 83 y.o. male who presents for EGD for evaluation and treatment of known esophageal stricture.  No significant changes in clinical history since last OV with me on 04/08/2024.  Has been tolerating Pepcid  20 mg nightly without issue.  Dysphagia overall improved since last EGD with dilation, but still with intermittent symptoms, particularly with pills.  Still with increased throat clearing and difficulty controlling phlegm.  Endoscopic History: -Colonoscopy (03/2012, [facility]): Diminutive cecal polyp (SSP), 1.5 cm ascending colon polyp resected piecemeal and tattooed (TA), diminutive transverse polyp (TA), medium-sized internal/external hemorrhoids, sigmoid diverticulosis.  Recommend repeat in 6 months -Hemorrhoid banding at 3 separate sessions in 05/2012 -Colonoscopy (07/2012, Dr. Jefrey  at [facility]): 7-8 mm polyp at hepatic flexure, cecal polyp, prior polypectomy site in ascending colon biopsy, Scar from prior hemorrhoid banding and distal rectal, sigmoid diverticulosis.  No path review.  Repeat in 3 years -Colonoscopy (11/2015, Dr. Jefrey at [facility]): 5 mm tubular adenoma in ascending colon, no residual polyp at prior descending colon polypectomy site, pandiverticulosis, external hemorrhoids.  Scar from  prior hemorrhoid banding in distal rectum.  Recommended repeat in 5 years - Colonoscopy (09/2020): Tattoo with adjacent scar and subtle nodularity in proximal ascending colon (biopsied: Benign), 3 subcentimeter tubular adenomas, sigmoid diverticulosis, internal hemorrhoids with scars from prior banding.  Recommend repeat in 3-5 years for continued surveillance --EGD (04/11/2023) Dr. San for dysphagia- - Normal oropharynx. - Severe narrowing at the cricopharyngeus, with possible diverticulum. This was not traversable. - No specimens collected. - EGD (02/02/2024): Benign-appearing stenosis at cricopharyngeus traversed with ultraslim scope and dilated with 12 mm Savary with appropriate mucosal disruption then able to pass the standard endoscope.  Second stricture in the lower esophagus dilated with 18 mm TTS balloon and fractured with forceps.  LA Grade A erosive esophagitis, Hill grade 3 valve, normal stomach and duodenum.  Recommended pantoprazole  40 mg twice daily  Past Medical History:  Diagnosis Date   AV BLOCK, COMPLETE    s/p PPM (SJM)   Cataract both eyes corrected   CHF (congestive heart failure) (HCC)    Chronic right-sided low back pain with right-sided sciatica 11/09/2019   Complication of anesthesia    slow to come out once   Emphysema lung (HCC) 11/09/2020   ERECTILE DYSFUNCTION, ORGANIC    History of colon polyps    Hypertension    Hypertension    Presence of permanent cardiac pacemaker     Past Surgical History:  Procedure Laterality Date   CATARACT EXTRACTION W/ INTRAOCULAR LENS IMPLANT  2013   right eye - [facility]   CATARACT EXTRACTION W/ INTRAOCULAR LENS IMPLANT  2015   Left eye - [facility]   COLONOSCOPY  11/29/2015   [facility]   ESOPHAGOGASTRODUODENOSCOPY N/A 02/02/2024   Procedure: EGD (ESOPHAGOGASTRODUODENOSCOPY);  Surgeon: San Sandor GAILS, DO;  Location:  [facility] ENDOSCOPY;  Service: Gastroenterology;  Laterality: N/A;  ultra slim scope,  fluoroscopy   INSERT / REPLACE / REMOVE PACEMAKER  19/feb/2015   KNEE SURGERY     PACEMAKER PLACEMENT  12/22/1996   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, most recent generator (SJM) 05/02/04 both implanted by Dr [facility]   PERMANENT PACEMAKER GENERATOR CHANGE N/A 07/29/2013   Procedure: PERMANENT PACEMAKER GENERATOR CHANGE;  Surgeon: Lynwood JONETTA Rakers, MD;  Location: [facility] CATH LAB;  Service: Cardiovascular;  Laterality: N/A;   PPM GENERATOR CHANGEOUT N/A 09/19/2023   Procedure: PPM GENERATOR CHANGEOUT;  Surgeon: Inocencio Soyla Lunger, MD;  Location: [facility] INVASIVE CV LAB;  Service: Cardiovascular;  Laterality: N/A;   VASECTOMY      Prior to Admission medications   Medication Sig Start Date End Date Taking? Authorizing Provider  acetaminophen  (TYLENOL ) 160 MG/5ML suspension Take 320 mg by mouth every 6 (six) hours as needed for moderate pain (pain score 4-6) or mild pain (pain score 1-3).   Yes [provider]  aspirin  EC 81 MG tablet Take 1 tablet (81 mg total) by mouth daily. Swallow whole. 06/28/22  Yes Pietro Redell RAMAN, MD  Cholecalciferol (VITAMIN D3) LIQD Take 4 drops by mouth daily. In Water 1000 mcg   Yes [provider]  famotidine  (PEPCID ) 20 MG tablet Take 1 tablet (20 mg total) by mouth daily. 04/08/24  Yes Illiana Losurdo V, DO  ipratropium (ATROVENT) 0.06 % nasal spray Place 2 sprays into both nostrils 2 (two) times daily. 08/21/23  Yes [provider]  losartan  (COZAAR ) 25 MG tablet TAKE 1 TABLET(25 MG) BY MOUTH DAILY 04/14/24  Yes Crenshaw, Redell RAMAN, MD  metoprolol  succinate (TOPROL -XL) 25 MG 24 hr tablet TAKE 1 TABLET(25 MG) BY MOUTH DAILY 01/15/24  Yes Crenshaw, Redell RAMAN, MD  Multiple Vitamins-Minerals (PRESERVISION AREDS 2) CHEW Chew 1 tablet by mouth daily.   Yes [provider]  hydrochlorothiazide  (HYDRODIURIL ) 25 MG tablet TAKE 1 TABLET(25 MG) BY MOUTH DAILY Patient not taking: No sig reported 06/05/23   Pietro Redell RAMAN, MD  ibuprofen (ADVIL) 200 MG  tablet Take 200 mg by mouth daily as needed for mild pain (pain score 1-3) or moderate pain (pain score 4-6).    [provider]  pantoprazole  (PROTONIX ) 40 MG tablet Take 1 tablet (40 mg total) by mouth 2 (two) times daily. Take 1 tablet by mouth twice daily for 6 weeks, then reduce to 1 tablet by mouth daily 02/02/24 02/01/25  Savva Beamer V, DO    Current Facility-Administered Medications  Medication Dose Route Frequency Provider Last Rate Last Admin   0.9 %  sodium chloride  infusion   Intravenous Continuous Montserrath Madding V, DO        Allergies as of 04/08/2024 - Review Complete 04/08/2024  Allergen Reaction Noted   Anoro ellipta  [umeclidinium-vilanterol] Other (See Comments) 11/13/2021   Lisinopril  Other (See Comments) 04/14/2019    Family History  Problem Relation Age of Onset   Breast cancer Mother    Heart attack Brother    Diabetes Other    Hodgkin's lymphoma Brother    Colon cancer Neg Hx    Esophageal cancer Neg Hx    Rectal cancer Neg Hx    Stomach cancer Neg Hx     Social History   Socioeconomic History   Marital status: Married    Spouse name: Not on file   Number of children: Not on file   Years of education: Not on file   Highest education level:  12th grade  Occupational History   Occupation: Retired  Tobacco Use   Smoking status: Former    Current packs/day: 0.00    Average packs/day: 1.5 packs/day for 35.0 years (52.5 ttl pk-yrs)    Types: Cigarettes, Pipe, Cigars    Start date: 06/10/1950    Quit date: 06/10/1985    Years since quitting: 38.9   Smokeless tobacco: Never  Substance and Sexual Activity   Alcohol use: Yes    Alcohol/week: 14.0 standard drinks of alcohol    Types: 14 Cans of beer per week   Drug use: No   Sexual activity: Never  Other Topics Concern   Not on file  Social History Narrative   Recently retired,Former chartered certified accountant.  Exercise.    Social Drivers of Corporate Investment Banker Strain: Low Risk  (11/30/2023)    Overall Financial Resource Strain (CARDIA)    Difficulty of Paying Living Expenses: Not hard at all  Food Insecurity: No Food Insecurity (11/30/2023)   Hunger Vital Sign    Worried About Running Out of Food in the Last Year: Never true    Ran Out of Food in the Last Year: Never true  Transportation Needs: No Transportation Needs (11/30/2023)   PRAPARE - Administrator, Civil Service (Medical): No    Lack of Transportation (Non-Medical): No  Physical Activity: Inactive (11/30/2023)   Exercise Vital Sign    Days of Exercise per Week: 0 days    Minutes of Exercise per Session: Not on file  Stress: No Stress Concern Present (11/30/2023)   [employer] of Occupational Health - Occupational Stress Questionnaire    Feeling of Stress: Not at all  Social Connections: Moderately Isolated (11/30/2023)   Social Connection and Isolation Panel    Frequency of Communication with Friends and Family: More than three times a week    Frequency of Social Gatherings with Friends and Family: Twice a week    Attends Religious Services: Patient declined    Database Administrator or Organizations: No    Attends Engineer, Structural: Not on file    Marital Status: Married  Catering Manager Violence: Not on file    Physical Exam: Vital signs in last 24 hours: @BP  (!) 185/64   Pulse 60   Temp 97.9 F (36.6 C) (Temporal)   Resp 13   Ht 5' 10 (1.778 m)   Wt 61.2 kg   SpO2 100%   BMI 19.37 kg/m  GEN: NAD EYE: Sclerae anicteric ENT: MMM CV: Non-tachycardic Pulm: CTA b/l GI: Soft, NT/ND NEURO:  Alert & Oriented x 3   Sandor Flatter, DO [facility]   05/11/2024 11:23 AM

## 2024-05-11 NOTE — Transfer of Care (Signed)
 Immediate Anesthesia Transfer of Care Note  Patient: Donald Patel  Procedure(s) Performed: EGD (ESOPHAGOGASTRODUODENOSCOPY) DILATION, ESOPHAGUS  Patient Location: PACU  Anesthesia Type:MAC  Level of Consciousness: awake  Airway & Oxygen Therapy: Patient Spontanous Breathing and Patient connected to face mask oxygen  Post-op Assessment: Report given to RN and Post -op Vital signs reviewed and stable  Post vital signs: Reviewed and stable  Last Vitals:  Vitals Value Taken Time  BP 135/51 05/11/24 12:20  Temp 36.3 C 05/11/24 12:17  Pulse 60 05/11/24 12:21  Resp 20 05/11/24 12:21  SpO2 98 % 05/11/24 12:21  Vitals shown include unfiled device data.  Last Pain:  Vitals:   05/11/24 1217  TempSrc: Temporal  PainSc: 0-No pain         Complications: No notable events documented.

## 2024-05-11 NOTE — Anesthesia Preprocedure Evaluation (Addendum)
 Anesthesia Evaluation  Patient identified by MRN, date of birth, ID band Patient awake    Reviewed: Allergy & Precautions, NPO status , Patient's Chart, lab work & pertinent test results  History of Anesthesia Complications (+) history of anesthetic complications  Airway Mallampati: II  TM Distance: >3 FB Neck ROM: Full    Dental  (+) Dental Advisory Given   Pulmonary COPD, former smoker   breath sounds clear to auscultation       Cardiovascular hypertension, Pt. 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 Left ventricular ejection fraction by 3D volume is 53 %. The left  ventricle has low normal function. The left ventricle has no regional wall  motion abnormalities. The left  ventricular internal cavity size was normal in size. There is no left  ventricular hypertrophy. Abnormal (paradoxical) septal motion, consistent  with RV pacemaker. Left ventricular diastolic parameters are consistent  with Grade I diastolic dysfunction  (impaired relaxation).    Dual AV Paced R 60bpm     Neuro/Psych  Neuromuscular disease  negative psych ROS   GI/Hepatic negative GI ROS, Neg liver ROS,,,  Endo/Other  negative endocrine ROS    Renal/GU negative Renal ROS     Musculoskeletal   Abdominal   Peds  Hematology negative hematology ROS (+)   Anesthesia Other Findings All: Lisinopril   Reproductive/Obstetrics                              Anesthesia Physical Anesthesia Plan  ASA: 3  Anesthesia Plan: MAC   Post-op Pain Management: Minimal or no pain anticipated   Induction: Intravenous  PONV Risk Score and Plan: 1 and Propofol  infusion and Treatment may vary due to age or medical condition  Airway Management Planned: Natural  Airway, Nasal Cannula and Simple Face Mask  Additional Equipment: None  Intra-op Plan:   Post-operative Plan:   Informed Consent: I have reviewed the patients History and Physical, chart, labs and discussed the procedure including the risks, benefits and alternatives for the proposed anesthesia with the patient or authorized representative who has indicated his/her understanding and acceptance.     Dental advisory given  Plan Discussed with: CRNA  Anesthesia Plan Comments: ( Chief Complaint:    Esophageal stricture, dysphagia   GI History: Donald Patel is a 83 y.o. 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  )         Anesthesia Quick Evaluation

## 2024-05-11 NOTE — Op Note (Signed)
 [facility] Patient Name: Donald Patel Procedure Date: 05/11/2024 MRN: [id number] Attending MD: Sandor Flatter , MD, [id number] Date of Birth: 06/09/1941 CSN: [id number] Age: 83 Admit Type: Ambulatory Procedure:                Upper GI endoscopy Indications:              Dysphagia, For therapy of esophageal stricture Providers:                Sandor Flatter, MD, Jacquelyn Jaci Pierce, RN,                            Fairy Marina, Technician Referring MD:              Medicines:                Monitored Anesthesia Care Complications:            No immediate complications. Estimated Blood Loss:     Estimated blood loss was minimal. Procedure:                Pre-Anesthesia Assessment:                           - Prior to the procedure, a History and Physical                            was performed, and patient medications and                            allergies were reviewed. The patient's tolerance of                            previous anesthesia was also reviewed. The risks                            and benefits of the procedure and the sedation                            options and risks were discussed with the patient.                            All questions were answered, and informed consent                            was obtained. Prior Anticoagulants: The patient has                            taken no anticoagulant or antiplatelet agents. ASA                            Grade Assessment: III - A patient with severe                            systemic disease. After reviewing the risks and  benefits, the patient was deemed in satisfactory                            condition to undergo the procedure.                           After obtaining informed consent, the endoscope was                            passed under direct vision. Throughout the                            procedure, the patient's blood pressure, pulse, and                             oxygen saturations were monitored continuously. The                            [id number]-[id number] ([id number]) Olympus endoscope was                            introduced through the mouth, and advanced to the                            second part of duodenum. The [id number]-[id number] ([id number])                            Olympus endoscope was introduced through the and                            advanced to the. The upper GI endoscopy was                            accomplished without difficulty. The patient                            tolerated the procedure well. Scope In: Scope Out: Findings:      One benign-appearing, intrinsic moderate stenosis was found 17 cm from       the incisors. This stenosis measured 1 cm (in length). The stenosis was       traversed using the Ultraslim scope. A guidewire was placed and the       scope was withdrawn. Dilation was performed with a Savary dilator with       no resistance at 10 mm and 12 mm. The endoscope was reintroduced and no       mucosal disruption noted. The guidewire was replaced and the stricture       was further dilated with mild resistance at 15 mm and 17 mm. The       dilation site was examined following endoscope reinsertion and showed       mild mucosal disruption and moderate improvement in luminal narrowing.       Estimated blood loss was minimal. Due to position of the stricture in       the very proximal esophagus, this location was not amenable to  triamcinolone injection.      One benign-appearing, intrinsic mild, non-obstructing stricture was       found in the lower third of the esophagus. This stenosis measured less       than one cm (in length). The stenosis was traversed.      The entire examined stomach was normal.      The examined duodenum was normal. Impression:               - Benign-appearing esophageal stenosis. Dilated                            with serial Savary dilations up to 17 mm with                             appropriate mucosal disruption consistent with                            successful dilation of the proximal esophageal                            stricture.w                           - Benign-appearing esophageal stenosis. This                            stricture was nonobstructing.                           - Normal stomach.                           - Normal examined duodenum.                           - No specimens collected. Moderate Sedation:      Not Applicable - Patient had care per Anesthesia. Recommendation:           - Patient has a contact number available for                            emergencies. The signs and symptoms of potential                            delayed complications were discussed with the                            patient. Return to normal activities tomorrow.                            Written discharge instructions were provided to the                            patient.                           - Soft diet today and slowly advance as tolerated  tomorrow per postdilation protocol.                           - Continue present medications.                           - Repeat upper endoscopy PRN for retreatment.                           - Follow-up in the GI clinic in 3 months or sooner                            as needed. Procedure Code(s):        --- Professional ---                           [phone], Esophagogastroduodenoscopy, flexible,                            transoral; with insertion of guide wire followed by                            passage of dilator(s) through esophagus over guide                            wire Diagnosis Code(s):        --- Professional ---                           K22.2, Esophageal obstruction                           R13.10, Dysphagia, unspecified CPT copyright 2022 [employer]. All rights reserved. The codes documented in this report are preliminary and upon  coder review may  be revised to meet current compliance requirements. Sandor Flatter, MD 05/11/2024 12:20:05 PM Number of Addenda: 0

## 2024-05-11 NOTE — Discharge Instructions (Signed)

## 2024-05-11 NOTE — Interval H&P Note (Signed)
 History and Physical Interval Note:  05/11/2024 11:26 AM  Donald Patel  has presented today for surgery, with the diagnosis of esohageal stricture, dysphagia, increased throat clearing.  The various methods of treatment have been discussed with the patient and family. After consideration of risks, benefits and other options for treatment, the patient has consented to  Procedure(s): EGD (ESOPHAGOGASTRODUODENOSCOPY) (N/A) as a surgical intervention.  The patient's history has been reviewed, patient examined, no change in status, stable for surgery.  I have reviewed the patient's chart and labs.  Questions were answered to the patient's satisfaction.     Sandor GAILS Rogue Rafalski

## 2024-05-11 NOTE — Anesthesia Postprocedure Evaluation (Signed)
 Anesthesia Post Note  Patient: Donald Patel  Procedure(s) Performed: EGD (ESOPHAGOGASTRODUODENOSCOPY) DILATION, ESOPHAGUS     Patient location during evaluation: PACU Anesthesia Type: MAC Level of consciousness: awake and alert Pain management: pain level controlled Vital Signs Assessment: post-procedure vital signs reviewed and stable Respiratory status: spontaneous breathing, nonlabored ventilation, respiratory function stable and patient connected to nasal cannula oxygen Cardiovascular status: stable and blood pressure returned to baseline Postop Assessment: no apparent nausea or vomiting Anesthetic complications: no   No notable events documented.  Last Vitals:  Vitals:   05/11/24 1230 05/11/24 1238  BP: (!) 172/64 (!) 181/67  Pulse: (!) 58 60  Resp: 15 15  Temp:    SpO2: 99% 98%    Last Pain:  Vitals:   05/11/24 1238  TempSrc:   PainSc: 0-No pain                 Ayelet Gruenewald

## 2024-05-12 ENCOUNTER — Encounter (HOSPITAL_COMMUNITY): Payer: Self-pay | Admitting: Gastroenterology

## 2024-05-14 ENCOUNTER — Encounter: Payer: Self-pay | Admitting: Gastroenterology

## 2024-05-17 NOTE — Telephone Encounter (Signed)
 Called and spoke with patient. Patient has been scheduled for a telephone PV on Thursday, 05/20/24 at 10:30 am. [facility] colonoscopy is scheduled for Monday, 05/24/24 arriving at 12:30 pm with a care partner. Patient understands that he will receive his instructions via MyChart after PV appt and prep will be sent to pharmacy after that as well. Patient verbalized understanding and had no concerns at the end of the call.

## 2024-05-20 ENCOUNTER — Encounter: Payer: Self-pay | Admitting: Gastroenterology

## 2024-05-20 ENCOUNTER — Ambulatory Visit

## 2024-05-20 VITALS — Ht 70.0 in | Wt 135.0 lb

## 2024-05-20 DIAGNOSIS — Z860100 Personal history of colon polyps, unspecified: Secondary | ICD-10-CM

## 2024-05-20 DIAGNOSIS — K625 Hemorrhage of anus and rectum: Secondary | ICD-10-CM

## 2024-05-20 MED ORDER — NA SULFATE-K SULFATE-MG SULF 17.5-3.13-1.6 GM/177ML PO SOLN: 1.0000 | Freq: Once | ORAL | 0 refills | Status: AC

## 2024-05-20 NOTE — Progress Notes (Signed)
 No egg or soy allergy known to patient  No issues known to pt with past sedation with any surgeries or procedures- trouble waking up after anesthesia per patient  Patient denies ever being told they had issues or difficulty with intubation  No FH of Malignant Hyperthermia Pt is not on diet pills Pt is not on  home 02  Pt is not on blood thinners  Pt denies issues with constipation  No A fib or A flutter Have any cardiac testing pending--NO Pt can ambulate-Independently Pt denies use of chewing tobacco Discussed diabetic I weight loss medication holds Discussed NSAID holds Checked BMI Pt instructed to use [employer] or GoodRx for a price reduction on prep  Patient's chart reviewed by Norleen Schillings CNRA prior to previsit and patient appropriate for the [facility].  Pre visit completed and red dot placed by patient's name on their procedure day (on provider's schedule).

## 2024-05-24 ENCOUNTER — Encounter: Payer: Self-pay | Admitting: Gastroenterology

## 2024-05-24 ENCOUNTER — Ambulatory Visit: Admitting: Gastroenterology

## 2024-05-24 VITALS — BP 141/55 | HR 60 | Temp 97.9°F | Resp 14 | Ht 70.0 in | Wt 135.0 lb

## 2024-05-24 DIAGNOSIS — D124 Benign neoplasm of descending colon: Secondary | ICD-10-CM

## 2024-05-24 DIAGNOSIS — K5730 Diverticulosis of large intestine without perforation or abscess without bleeding: Secondary | ICD-10-CM

## 2024-05-24 DIAGNOSIS — Z860100 Personal history of colon polyps, unspecified: Secondary | ICD-10-CM

## 2024-05-24 DIAGNOSIS — K635 Polyp of colon: Secondary | ICD-10-CM | POA: Diagnosis not present

## 2024-05-24 DIAGNOSIS — K602 Anal fissure, unspecified: Secondary | ICD-10-CM

## 2024-05-24 DIAGNOSIS — D123 Benign neoplasm of transverse colon: Secondary | ICD-10-CM

## 2024-05-24 DIAGNOSIS — K625 Hemorrhage of anus and rectum: Secondary | ICD-10-CM

## 2024-05-24 MED ORDER — SODIUM CHLORIDE 0.9 % IV SOLN: 500.0000 mL | Freq: Once | INTRAVENOUS | Status: DC

## 2024-05-24 NOTE — Patient Instructions (Addendum)
-  Handout on polyps, and diverticulosis provided. -await pathology results. -repeat colonoscopy for surveillance recommended. Date to be determined when pathology result become available.  -Continue present medications. Start  Topical nitroglycerin  0.125% for treatment of anal fissure. Apply a small pea-sized amount to the affected area twice daily for 6 weeks.medication called into [facility] [phone] Avoid  strenuous activity, exercise, etc. within 30-60 minutes of medication application.  YOU HAD AN ENDOSCOPIC PROCEDURE TODAY AT THE [facility]:   Refer to the procedure report that was given to you for any specific questions about what was found during the examination.  If the procedure report does not answer your questions, please call your gastroenterologist to clarify.  If you requested that your care partner not be given the details of your procedure findings, then the procedure report has been included in a sealed envelope for you to review at your convenience later.  YOU SHOULD EXPECT: Some feelings of bloating in the abdomen. Passage of more gas than usual.  Walking can help get rid of the air that was put into your GI tract during the procedure and reduce the bloating. If you had a lower endoscopy (such as a colonoscopy or flexible sigmoidoscopy) you may notice spotting of blood in your stool or on the toilet paper. If you underwent a bowel prep for your procedure, you may not have a normal bowel movement for a few days.  Please Note:  You might notice some irritation and congestion in your nose or some drainage.  This is from the oxygen used during your procedure.  There is no need for concern and it should clear up in a day or so.  SYMPTOMS TO REPORT IMMEDIATELY:  Following lower endoscopy (colonoscopy or flexible sigmoidoscopy):  Excessive amounts of blood in the stool  Significant tenderness or worsening of abdominal pains  Swelling of the abdomen that is new,  acute  Fever of 100F or higher  For urgent or emergent issues, a gastroenterologist can be reached at any hour by calling [phone]. Do not use MyChart messaging for urgent concerns.    DIET:  We do recommend a small meal at first, but then you may proceed to your regular diet.  Drink plenty of fluids but you should avoid alcoholic beverages for 24 hours.  ACTIVITY:  You should plan to take it easy for the rest of today and you should NOT DRIVE or use heavy machinery until tomorrow (because of the sedation medicines used during the test).    FOLLOW UP: Our staff will call the number listed on your records the next business day following your procedure.  We will call around 7:15- 8:00 am to check on you and address any questions or concerns that you may have regarding the information given to you following your procedure. If we do not reach you, we will leave a message.     If any biopsies were taken you will be contacted by phone or by letter within the next 1-3 weeks.  Please call us  at [phone] if you have not heard about the biopsies in 3 weeks.    SIGNATURES/CONFIDENTIALITY: You and/or your care partner have signed paperwork which will be entered into your electronic medical record.  These signatures attest to the fact that that the information above on your After Visit Summary has been reviewed and is understood.  Full responsibility of the confidentiality of this discharge information lies with you and/or your care-partner.

## 2024-05-24 NOTE — Progress Notes (Signed)
 Pt's states no medical or surgical changes since previsit or office visit.

## 2024-05-24 NOTE — Progress Notes (Signed)
 Vss nad trans to pacu

## 2024-05-24 NOTE — Progress Notes (Signed)
 Called to room to assist during endoscopic procedure.  Patient ID and intended procedure confirmed with present staff. Received instructions for my participation in the procedure from the performing physician.

## 2024-05-24 NOTE — Op Note (Signed)
 [facility] Patient Name: Donald Patel Procedure Date: 05/24/2024 12:21 PM MRN: [id number] Endoscopist: Sandor Flatter , MD, [id number] Age: 83 Referring MD:  Date of Birth: 1941-03-14 Gender: Male Account #: [id number] Procedure:                Colonoscopy Indications:              Hematochezia, Change in bowel habits                           Additionally, history of colon polyps and due for                            surveillance.                           Last colonoscopy was 09/2020 and notable for tattoo                            with adjacent scar and subtle nodularity in                            proximal ascending colon (biopsied: Benign), 3                            subcentimeter tubular adenomas, sigmoid                            diverticulosis, internal hemorrhoids with scars                            from prior banding. Recommend repeat in 3 years for                            continued surveillance Medicines:                Monitored Anesthesia Care Procedure:                Pre-Anesthesia Assessment:                           - Prior to the procedure, a History and Physical                            was performed, and patient medications and                            allergies were reviewed. The patient's tolerance of                            previous anesthesia was also reviewed. The risks                            and benefits of the procedure and the sedation                            options and risks were  discussed with the patient.                            All questions were answered, and informed consent                            was obtained. Prior Anticoagulants: The patient has                            taken no anticoagulant or antiplatelet agents. ASA                            Grade Assessment: II - A patient with mild systemic                            disease. After reviewing the risks and benefits,                             the patient was deemed in satisfactory condition to                            undergo the procedure.                           After obtaining informed consent, the colonoscope                            was passed under direct vision. Throughout the                            procedure, the patient's blood pressure, pulse, and                            oxygen saturations were monitored continuously. The                            CF [id number] #[id number] was introduced through the anus                            and advanced to the the cecum, identified by                            appendiceal orifice and ileocecal valve. The                            ileocecal valve, appendiceal orifice, and rectum                            were photographed. Scope In: 1:31:22 PM Scope Out: 1:51:06 PM Scope Withdrawal Time: 0 hours 14 minutes 22 seconds  Total Procedure Duration: 0 hours 19 minutes 44 seconds  Findings:                 Perianal exam notable for external skin tags,  hemorrhoids, and anal fissure.                           Two sessile polyps were found in the descending                            colon and transverse colon. The polyps were 3 to 4                            mm in size. These polyps were removed with a cold                            snare. Resection and retrieval were complete.                            Estimated blood loss was minimal.                           Multiple small-mouthed diverticula were found in                            the sigmoid colon.                           A tattoo were seen in the ascending colon. The                            tattoo site appeared normal. No residual polypoid                            tissue noted.                           Normal mucosa was found in the entire colon.                            Biopsies for histology were taken with a cold                            forceps from the right colon and  left colon for                            evaluation of microscopic colitis. Estimated blood                            loss was minimal.                           A few scars from prior hemorrhoid surgery were                            found in the distal rectum. The scar tissue was  healthy in appearance.                           The sigmoid colon was moderately tortuous.                            Advancing the scope required straightening and                            shortening the scope to obtain bowel loop reduction. Complications:            No immediate complications. Estimated Blood Loss:     Estimated blood loss was minimal. Impression:               - Perianal exam notable for external skin tags,                            hemorrhoids, and anal fissure.                           - Two 3 to 4 mm polyps in the descending colon and                            in the transverse colon, removed with a cold snare.                            Resected and retrieved.                           - Diverticulosis in the sigmoid colon.                           - A tattoo was seen in the ascending colon. The                            tattoo site appeared normal.                           - Normal mucosa in the entire examined colon.                            Biopsied.                           - Scars in the distal rectum from prior hemorrhoid                            surgery..                           - Tortuous colon.                           - At the conclusion of the procedure, the  colonoscope was reintroduced to the cecum and air                            was removed via suction with full decompression of                            the colon lumen. Recommendation:           - Patient has a contact number available for                            emergencies. The signs and symptoms of potential                             delayed complications were discussed with the                            patient. Return to normal activities tomorrow.                            Written discharge instructions were provided to the                            patient.                           - Resume previous diet.                           - Continue present medications.                           - Await pathology results.                           - Repeat colonoscopy is not recommended due to                            current age (75 years or older) for screening                            purposes.                           - Topical nitroglycerin  0.125% for treatment of                            anal fissure. Apply a small pea-sized amount to the                            affected area twice daily for 6 weeks. Avoid                            strenuous activity, exercise, etc. within 30-60  minutes of medication application.                           - Return to GI clinic PRN. Sandor Flatter, MD 05/24/2024 2:02:59 PM

## 2024-05-24 NOTE — Progress Notes (Signed)
 GASTROENTEROLOGY PROCEDURE H&P NOTE   Primary Care Physician: Alvan Dorothyann BIRCH, MD    Reason for Procedure:  Hematochezia, change in bowel habits, colon polyp surveillance  Plan:    Colonoscopy  Patient is appropriate for endoscopic procedure(s) in the ambulatory ([facility]) setting.  The nature of the procedure, as well as the risks, benefits, and alternatives were carefully and thoroughly reviewed with the patient. Ample time for discussion and questions allowed. The patient understood, was satisfied, and agreed to proceed. I personally addressed all patient questions and concerns.     HPI: Donald Patel is a 83 y.o. male who presents for colonoscopy for evaluation of hematochezia and change in bowel habits.  Has been having intermittent BRBPR and episodic fecal seepage.  Last colonoscopy was 09/2020 and notable for tattoo with adjacent scar and subtle nodularity in proximal ascending colon (biopsied: Benign), 3 subcentimeter tubular adenomas, sigmoid diverticulosis, internal hemorrhoids with scars from prior banding. Recommend repeat in 3 years for continued surveillance   Past Medical History:  Diagnosis Date   AV BLOCK, COMPLETE    s/p PPM (S[facility])   Cataract both eyes corrected   CHF (congestive heart failure) (HCC)    Chronic right-sided low back pain with right-sided sciatica 11/09/2019   Complication of anesthesia    slow to come out once   Emphysema lung (HCC) 11/09/2020   ERECTILE DYSFUNCTION, ORGANIC    History of colon polyps    Hypertension    Hypertension    Presence of permanent cardiac pacemaker     Past Surgical History:  Procedure Laterality Date   CATARACT EXTRACTION W/ INTRAOCULAR LENS IMPLANT  2013   right eye - [facility]   CATARACT EXTRACTION W/ INTRAOCULAR LENS IMPLANT  2015   Left eye - [facility]   COLONOSCOPY  11/29/2015   [facility]   ESOPHAGEAL DILATION  05/11/2024   Procedure: DILATION, ESOPHAGUS;  Surgeon:  San Sandor GAILS, DO;  Location: [facility];  Service: Gastroenterology;;   ESOPHAGOGASTRODUODENOSCOPY N/A 02/02/2024   Procedure: EGD (ESOPHAGOGASTRODUODENOSCOPY);  Surgeon: San Sandor GAILS, DO;  Location: [facility];  Service: Gastroenterology;  Laterality: N/A;  ultra slim scope, fluoroscopy   ESOPHAGOGASTRODUODENOSCOPY N/A 05/11/2024   Procedure: EGD (ESOPHAGOGASTRODUODENOSCOPY);  Surgeon: San Sandor GAILS, DO;  Location: [facility];  Service: Gastroenterology;  Laterality: N/A;   INSERT / REPLACE / REMOVE PACEMAKER  19/feb/2015   KNEE SURGERY     PACEMAKER PLACEMENT  12/22/1996   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, most recent generator ([facility]) 05/02/04 [facility]   PERMANENT PACEMAKER GENERATOR CHANGE N/A 07/29/2013   Procedure: PERMANENT PACEMAKER GENERATOR CHANGE;  Surgeon: Lynwood BIRCH Rakers, MD;  Location: [facility] CATH LAB;  Service: Cardiovascular;  Laterality: N/A;   PPM GENERATOR CHANGEOUT N/A 09/19/2023   Procedure: PPM GENERATOR CHANGEOUT;  Surgeon: Inocencio Soyla Lunger, MD;  Location: [facility] INVASIVE CV LAB;  Service: Cardiovascular;  Laterality: N/A;   VASECTOMY      Prior to Admission medications  Medication Sig Start Date End Date Taking? Authorizing Provider  acetaminophen  (TYLENOL ) 160 MG/5ML suspension Take 320 mg by mouth every 6 (six) hours as needed for moderate pain (pain score 4-6) or mild pain (pain score 1-3).    [provider]  aspirin  EC 81 MG tablet Take 1 tablet (81 mg total) by mouth daily. Swallow whole. 06/28/22   Pietro Redell RAMAN, MD  Cholecalciferol (VITAMIN D3) LIQD Take 4 drops by mouth daily. In Water 1000 mcg  [provider]  famotidine  (PEPCID ) 20 MG tablet Take 1 tablet (20 mg total) by mouth daily. 04/08/24   Corra Kaine V, DO  hydrochlorothiazide  (HYDRODIURIL ) 25 MG tablet TAKE 1 TABLET(25 MG) BY MOUTH DAILY Patient not taking: Reported on 05/20/2024 06/05/23   Pietro Redell RAMAN, MD  ibuprofen (ADVIL) 200 MG  tablet Take 200 mg by mouth daily as needed for mild pain (pain score 1-3) or moderate pain (pain score 4-6).    [provider]  ipratropium (ATROVENT) 0.06 % nasal spray Place 2 sprays into both nostrils 2 (two) times daily. 08/21/23   [provider]  losartan  (COZAAR ) 25 MG tablet TAKE 1 TABLET(25 MG) BY MOUTH DAILY 04/14/24   Pietro Redell RAMAN, MD  metoprolol  succinate (TOPROL -XL) 25 MG 24 hr tablet TAKE 1 TABLET(25 MG) BY MOUTH DAILY 01/15/24   Pietro Redell RAMAN, MD  Multiple Vitamins-Minerals (PRESERVISION AREDS 2) CHEW Chew 1 tablet by mouth daily.    [provider]  pantoprazole  (PROTONIX ) 40 MG tablet Take 1 tablet (40 mg total) by mouth 2 (two) times daily. Take 1 tablet by mouth twice daily for 6 weeks, then reduce to 1 tablet by mouth daily 02/02/24 02/01/25  Ninoshka Wainwright V, DO    Current Outpatient Medications  Medication Sig Dispense Refill   acetaminophen  (TYLENOL ) 160 MG/5ML suspension Take 320 mg by mouth every 6 (six) hours as needed for moderate pain (pain score 4-6) or mild pain (pain score 1-3).     aspirin  EC 81 MG tablet Take 1 tablet (81 mg total) by mouth daily. Swallow whole. 90 tablet 3   Cholecalciferol (VITAMIN D3) LIQD Take 4 drops by mouth daily. In Water 1000 mcg     famotidine  (PEPCID ) 20 MG tablet Take 1 tablet (20 mg total) by mouth daily. 30 tablet 3   hydrochlorothiazide  (HYDRODIURIL ) 25 MG tablet TAKE 1 TABLET(25 MG) BY MOUTH DAILY (Patient not taking: Reported on 05/20/2024) 90 tablet 3   ibuprofen (ADVIL) 200 MG tablet Take 200 mg by mouth daily as needed for mild pain (pain score 1-3) or moderate pain (pain score 4-6).     ipratropium (ATROVENT) 0.06 % nasal spray Place 2 sprays into both nostrils 2 (two) times daily.     losartan  (COZAAR ) 25 MG tablet TAKE 1 TABLET(25 MG) BY MOUTH DAILY 90 tablet 1   metoprolol  succinate (TOPROL -XL) 25 MG 24 hr tablet TAKE 1 TABLET(25 MG) BY MOUTH DAILY 90 tablet 3   Multiple Vitamins-Minerals  (PRESERVISION AREDS 2) CHEW Chew 1 tablet by mouth daily.     pantoprazole  (PROTONIX ) 40 MG tablet Take 1 tablet (40 mg total) by mouth 2 (two) times daily. Take 1 tablet by mouth twice daily for 6 weeks, then reduce to 1 tablet by mouth daily 90 tablet 1   No current facility-administered medications for this visit.    Allergies as of 05/24/2024 - Review Complete 05/20/2024  Allergen Reaction Noted   Anoro ellipta  [umeclidinium-vilanterol] Other (See Comments) 11/13/2021   Lisinopril  Other (See Comments) 04/14/2019    Family History  Problem Relation Age of Onset   Breast cancer Mother    Heart attack Brother    Diabetes Other    Hodgkin's lymphoma Brother    Colon cancer Neg Hx    Esophageal cancer Neg Hx    Rectal cancer Neg Hx    Stomach cancer Neg Hx     Social History   Socioeconomic History   Marital status: Married    Spouse name:  Not on file   Number of children: Not on file   Years of education: Not on file   Highest education level: 12th grade  Occupational History   Occupation: Retired  Tobacco Use   Smoking status: Former    Current packs/day: 0.00    Average packs/day: 1.5 packs/day for 35.0 years (52.5 ttl pk-yrs)    Types: Cigarettes, Pipe, Cigars    Start date: 06/10/1950    Quit date: 06/10/1985    Years since quitting: 38.9   Smokeless tobacco: Never  Vaping Use   Vaping status: Never Used  Substance and Sexual Activity   Alcohol use: Yes    Alcohol/week: 14.0 standard drinks of alcohol    Types: 14 Cans of beer per week   Drug use: No   Sexual activity: Never  Other Topics Concern   Not on file  Social History Narrative   Recently retired,Former chartered certified accountant.  Exercise.    Social Drivers of Health   Tobacco Use: Medium Risk (05/20/2024)   Patient History    Smoking Tobacco Use: Former    Smokeless Tobacco Use: Never    Passive Exposure: Not on file  Financial Resource Strain: Low Risk (11/30/2023)   Overall Financial Resource Strain (CARDIA)     Difficulty of Paying Living Expenses: Not hard at all  Food Insecurity: No Food Insecurity (11/30/2023)   Epic    Worried About Radiation Protection Practitioner of Food in the Last Year: Never true    Ran Out of Food in the Last Year: Never true  Transportation Needs: No Transportation Needs (11/30/2023)   Epic    Lack of Transportation (Medical): No    Lack of Transportation (Non-Medical): No  Physical Activity: Inactive (11/30/2023)   Exercise Vital Sign    Days of Exercise per Week: 0 days    Minutes of Exercise per Session: Not on file  Stress: No Stress Concern Present (11/30/2023)   [employer] of Occupational Health - Occupational Stress Questionnaire    Feeling of Stress: Not at all  Social Connections: Moderately Isolated (11/30/2023)   Social Connection and Isolation Panel    Frequency of Communication with Friends and Family: More than three times a week    Frequency of Social Gatherings with Friends and Family: Twice a week    Attends Religious Services: Patient declined    Database Administrator or Organizations: No    Attends Engineer, Structural: Not on file    Marital Status: Married  Catering Manager Violence: Not on file  Depression (PHQ2-9): Low Risk (12/04/2023)   Depression (PHQ2-9)    PHQ-2 Score: 0  Alcohol Screen: Low Risk (11/30/2023)   Alcohol Screen    Last Alcohol Screening Score (AUDIT): 4  Housing: Low Risk (11/30/2023)   Epic    Unable to Pay for Housing in the Last Year: No    Number of Times Moved in the Last Year: 0    Homeless in the Last Year: No  Utilities: Not on file  Health Literacy: Not on file    Physical Exam: Vital signs in last 24 hours: @There  were no vitals taken for this visit. GEN: NAD EYE: Sclerae anicteric ENT: MMM CV: Non-tachycardic Pulm: CTA b/l GI: Soft, NT/ND NEURO:  Alert & Oriented x 3   Sandor Flatter, DO [facility]   05/24/2024 12:27 PM

## 2024-05-25 ENCOUNTER — Telehealth: Payer: Self-pay | Admitting: *Deleted

## 2024-05-25 NOTE — Telephone Encounter (Signed)
°  Follow up Call-     05/24/2024   12:53 PM 04/11/2023   12:49 PM  Call back number  Post procedure Call Back phone  # [phone]  Permission to leave phone message Yes Yes     Patient questions:  Do you have a fever, pain , or abdominal swelling? No. Pain Score  0 *  Have you tolerated food without any problems? Yes.    Have you been able to return to your normal activities? Yes.    Do you have any questions about your discharge instructions: Diet   No. Medications  No. Follow up visit  No.  Do you have questions or concerns about your Care? No.  Actions: * If pain score is 4 or above: No action needed, pain <4.

## 2024-05-27 LAB — SURGICAL PATHOLOGY

## 2024-05-28 ENCOUNTER — Ambulatory Visit: Payer: Self-pay | Admitting: Gastroenterology

## 2024-06-08 ENCOUNTER — Ambulatory Visit (INDEPENDENT_AMBULATORY_CARE_PROVIDER_SITE_OTHER): Admitting: Family Medicine

## 2024-06-08 ENCOUNTER — Encounter: Payer: Self-pay | Admitting: Family Medicine

## 2024-06-08 VITALS — BP 136/55 | HR 56 | Ht 70.0 in | Wt 138.0 lb

## 2024-06-08 DIAGNOSIS — S99921A Unspecified injury of right foot, initial encounter: Secondary | ICD-10-CM

## 2024-06-08 DIAGNOSIS — J310 Chronic rhinitis: Secondary | ICD-10-CM | POA: Diagnosis not present

## 2024-06-08 DIAGNOSIS — I10 Essential (primary) hypertension: Secondary | ICD-10-CM | POA: Diagnosis not present

## 2024-06-08 DIAGNOSIS — Z95811 Presence of heart assist device: Secondary | ICD-10-CM | POA: Insufficient documentation

## 2024-06-08 DIAGNOSIS — J439 Emphysema, unspecified: Secondary | ICD-10-CM | POA: Diagnosis not present

## 2024-06-08 DIAGNOSIS — E559 Vitamin D deficiency, unspecified: Secondary | ICD-10-CM | POA: Diagnosis not present

## 2024-06-08 NOTE — Progress Notes (Signed)
 "  Established Patient Office Visit  Patient ID: [name], male    DOB: May 03, 1941  Age: 83 y.o. MRN: [id number] PCP: Alvan Donald BIRCH, MD  Chief Complaint  Patient presents with   Hypertension    Subjective:     HPI  Discussed the use of AI scribe software for clinical note transcription with the patient, who gave verbal consent to proceed.  History of Present Illness Donald Patel is an 83 year old male with hypertension and gastrointestinal issues who presents for follow-up on blood pressure management and gastrointestinal concerns.  Hypertension - Blood pressure has remained stable, typically in the 120s to 130s range. - Discontinued hydrochlorothiazide  several months ago due to side effects. - No extremely low blood pressure readings on home monitoring.  Gastrointestinal symptoms - Loose bowel movements and occasional rectal bleeding prompted recent colonoscopy. - Colonoscopy with biopsies showed normal results. - Uses ointment for irritation, with reduction in leakage after one week of treatment. - No current medication for heartburn; discontinued Protonix  due to adverse effects described as feeling 'la la land'.  Esophageal stricture and upper gastrointestinal symptoms - History of esophageal stricture requiring dilation procedures. - Initial stricture was severe, requiring hospital intervention for dilation. - Second dilation performed approximately two weeks ago, resulting in improvement of symptoms. - Persistent congestion and drainage, possibly allergy-related. - Uses nasal spray for congestion and drainage. - Occasional voice loss associated with congestion and drainage. - Chronic upper body pain evaluated with upper GI scope, which revealed no significant findings.  Right great toe injury - Sustained injury to right big toe over a year ago when struck by a cart. - Persistent toenail issues since injury. - Plans to consult podiatrist for further  evaluation.     ROS    Objective:     BP (!) 136/55   Pulse (!) 56   Ht 5' 10 (1.778 m)   Wt 138 lb (62.6 kg)   SpO2 100%   BMI 19.80 kg/m    Physical Exam Vitals and nursing note reviewed.  Constitutional:      Appearance: Normal appearance.  HENT:     Head: Normocephalic and atraumatic.  Eyes:     Conjunctiva/sclera: Conjunctivae normal.  Cardiovascular:     Rate and Rhythm: Normal rate and regular rhythm.  Pulmonary:     Effort: Pulmonary effort is normal.     Breath sounds: Normal breath sounds.  Skin:    General: Skin is warm and dry.  Neurological:     Mental Status: He is alert.  Psychiatric:        Mood and Affect: Mood normal.      No results found for any visits on 06/08/24.    The ASCVD Risk score (Arnett DK, et al., 2019) failed to calculate for the following reasons:   The 2019 ASCVD risk score is only valid for ages 81 to 34   * - Cholesterol units were assumed    Assessment & Plan:   Problem List Items Addressed This Visit       Cardiovascular and Mediastinum   Essential hypertension, benign - Primary   At goal. F/U in 6 mo. Due for CMP      Relevant Orders   CMP14+EGFR     Respiratory   Emphysema lung (HCC)   No recent flares ore exacerbations.       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 - Referred to Dr. Sikora at [facility] for evaluation.  Essential hypertension Blood pressure well-controlled, no current antihypertensive needed. - Monitor  blood pressure at home, report significant changes.  Esophageal stricture Improved symptoms post-dilation, congestion likely nasal. - Continue nasal spray for congestion management.  Chronic rhinitis Nasal drainage and congestion managed with nasal spray. - Continue nasal spray for symptom management.  General Health Maintenance Declined flu vaccination, blood work due. - Ordered blood work for routine monitoring.   Return in about 6 months (around 12/07/2024) for Hypertension.    Donald Byars, MD [facility] at [facility]   "

## 2024-06-08 NOTE — Assessment & Plan Note (Signed)
 Pacemaker in place

## 2024-06-08 NOTE — Assessment & Plan Note (Signed)
 No recent flares ore exacerbations.

## 2024-06-08 NOTE — Assessment & Plan Note (Signed)
 At goal. F/U in 6 mo. Due for CMP

## 2024-06-09 ENCOUNTER — Ambulatory Visit: Payer: Self-pay | Admitting: Family Medicine

## 2024-06-09 ENCOUNTER — Other Ambulatory Visit: Payer: Self-pay | Admitting: Gastroenterology

## 2024-06-09 LAB — CMP14+EGFR
ALT: 12 IU/L (ref 0–44)
AST: 15 IU/L (ref 0–40)
Albumin: 4.4 g/dL (ref 3.7–4.7)
Alkaline Phosphatase: 83 IU/L (ref 48–129)
BUN/Creatinine Ratio: 12 (ref 10–24)
BUN: 14 mg/dL (ref 8–27)
Bilirubin Total: 0.8 mg/dL (ref 0.0–1.2)
CO2: 24 mmol/L (ref 20–29)
Calcium: 9.7 mg/dL (ref 8.6–10.2)
Chloride: 100 mmol/L (ref 96–106)
Creatinine, Ser: 1.13 mg/dL (ref 0.76–1.27)
Globulin, Total: 2.6 g/dL (ref 1.5–4.5)
Glucose: 96 mg/dL (ref 70–99)
Potassium: 4.3 mmol/L (ref 3.5–5.2)
Sodium: 139 mmol/L (ref 134–144)
Total Protein: 7 g/dL (ref 6.0–8.5)
eGFR: 64 mL/min/1.73

## 2024-06-09 LAB — VITAMIN D 25 HYDROXY (VIT D DEFICIENCY, FRACTURES): Vit D, 25-Hydroxy: 55.2 ng/mL (ref 30.0–100.0)

## 2024-06-09 NOTE — Progress Notes (Signed)
 Your lab work is within acceptable range and there are no concerning findings.   ?

## 2024-06-14 ENCOUNTER — Ambulatory Visit: Admitting: Podiatry

## 2024-06-14 ENCOUNTER — Encounter: Payer: Self-pay | Admitting: Podiatry

## 2024-06-14 DIAGNOSIS — L603 Nail dystrophy: Secondary | ICD-10-CM

## 2024-06-14 NOTE — Progress Notes (Signed)
"  °  Subjective:  Patient ID: Donald Patel, male    DOB: December 09, 1940,   MRN: [id number]  Chief Complaint  Patient presents with   Nail Problem    I had a shopping cart that hit my toe a while ago.  I have a double nail growing. (Hallux right)    84 y.o. male presents for concern of right great toe.  Relates quite a while ago he had a shopping cart hit the toe and has had trouble ever since.  Relates nails been growing double and has at times been painful.  He is wondering what he can do about it.. Denies any other pedal complaints. Denies n/v/f/c.   Past Medical History:  Diagnosis Date   AV BLOCK, COMPLETE    s/p PPM (SJM)   Cataract both eyes corrected   CHF (congestive heart failure) (HCC)    Chronic right-sided low back pain with right-sided sciatica 11/09/2019   Complication of anesthesia    slow to come out once   Emphysema lung (HCC) 11/09/2020   ERECTILE DYSFUNCTION, ORGANIC    History of colon polyps    Hypertension    Hypertension    Presence of permanent cardiac pacemaker     Objective:  Physical Exam: Vascular: DP/PT pulses 2/4 bilateral. CFT <3 seconds. Normal hair growth on digits. No edema.  Skin. No lacerations or abrasions bilateral feet.  Right hallux nail thickened dystrophic with subungual debris.  Tender to palpation. Musculoskeletal: MMT 5/5 bilateral lower extremities in DF, PF, Inversion and Eversion. Deceased ROM in DF of ankle joint.  Neurological: Sensation intact to light touch.   Assessment:   1. Onychodystrophy      Plan:  Patient was evaluated and treated and all questions answered. Discussed dystrophic ingrown toenails etiology and treatment options including procedure for removal vs conservative care.  Today due to concern to concern for circulation advised we would just debride the nail.  Debrided as courtesy to patient satisfaction.  No incidents noted. Advised on using Vicks vapor rub to help keep the nail soft. Return as needed for  nail trimming.   Asberry Failing, DPM    "

## 2024-07-16 ENCOUNTER — Other Ambulatory Visit: Payer: Self-pay | Admitting: Cardiology

## 2024-07-16 DIAGNOSIS — I5042 Chronic combined systolic (congestive) and diastolic (congestive) heart failure: Secondary | ICD-10-CM

## 2024-07-29 ENCOUNTER — Encounter

## 2024-10-25 ENCOUNTER — Ambulatory Visit: Admitting: Cardiology

## 2024-10-28 ENCOUNTER — Encounter

## 2024-12-07 ENCOUNTER — Ambulatory Visit: Admitting: Family Medicine

## 2025-01-27 ENCOUNTER — Encounter

## 2025-04-28 ENCOUNTER — Encounter

## 2025-07-28 ENCOUNTER — Encounter
